# Patient Record
Sex: Female | Born: 1990 | Race: Black or African American | Hispanic: No | Marital: Single | Smoking: Never smoker
Health system: Southern US, Community
[De-identification: ages and names within clinical notes are randomized; demographics above are authoritative.]

## PROBLEM LIST (undated history)

## (undated) DIAGNOSIS — Z3490 Encounter for supervision of normal pregnancy, unspecified, unspecified trimester: Principal | ICD-10-CM

## (undated) DIAGNOSIS — N949 Unspecified condition associated with female genital organs and menstrual cycle: Secondary | ICD-10-CM

## (undated) DIAGNOSIS — B009 Herpesviral infection, unspecified: Secondary | ICD-10-CM

## (undated) DIAGNOSIS — R569 Unspecified convulsions: Secondary | ICD-10-CM

## (undated) HISTORY — DX: Encounter for supervision of normal pregnancy, unspecified, unspecified trimester: Z34.90

## (undated) HISTORY — DX: Unspecified condition associated with female genital organs and menstrual cycle: N94.9

## (undated) HISTORY — DX: Herpesviral infection, unspecified: B00.9

## (undated) HISTORY — PX: TONSILLECTOMY AND ADENOIDECTOMY: SHX28

## (undated) HISTORY — PX: DENTAL SURGERY: SHX609

## (undated) HISTORY — DX: Unspecified convulsions: R56.9

---
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
## 2011-06-12 LAB — RAPID STREP SCREEN (MED CTR MEBANE ONLY): Streptococcus, Group A Screen (Direct): NEGATIVE

## 2011-06-12 LAB — STREP A DNA PROBE: Group A Strep Probe: NEGATIVE

## 2011-07-20 ENCOUNTER — Emergency Department (HOSPITAL_COMMUNITY)
Admission: EM | Admit: 2011-07-20 | Discharge: 2011-07-20 | Disposition: A | Discharge status: Home / Self Care | Payer: BC Managed Care – PPO | Attending: Emergency Medicine | Admitting: Emergency Medicine

## 2011-07-20 DIAGNOSIS — J069 Acute upper respiratory infection, unspecified: Secondary | ICD-10-CM | POA: Insufficient documentation

## 2011-07-20 MED ORDER — PHENYLEPHRINE-CHLORPHEN-DM 12.5-4-15 MG/5ML PO SYRP: 5.0000 mL | ORAL_SOLUTION | ORAL | Status: DC

## 2011-07-20 NOTE — ED Notes (Signed)
Pt states has had sore throat x 5 days with worsening symptoms, to include cough and headache.  Denies fever/chills.

## 2011-07-20 NOTE — ED Provider Notes (Signed)
History     CSN: [id number] Arrival date & time: 07/20/2011  7:01 PM   First MD Initiated Contact with Patient 07/20/11 1912      Chief Complaint  Patient presents with  . Sore Throat  . Cough  . Weakness    (Consider location/radiation/quality/duration/timing/severity/associated sxs/prior treatment) HPI Comments: Pt states she was treated for an uri about 2 weeks ago. She got better, then started getting sick again about 5 days ago.  Patient is a 20 y.o. female presenting with pharyngitis, cough, and weakness. The history is provided by the patient.  Sore Throat This is a new problem. The current episode started in the past 7 days. The problem occurs daily. The problem has been gradually worsening. Associated symptoms include coughing, fatigue, headaches, a sore throat and weakness. Pertinent negatives include no abdominal pain, arthralgias, chest pain, nausea, neck pain or rash. The symptoms are aggravated by swallowing. She has tried nothing for the symptoms. The treatment provided no relief.  Cough Associated symptoms include headaches and sore throat. Pertinent negatives include no chest pain, no shortness of breath and no wheezing.  Weakness The primary symptoms include headaches. Primary symptoms do not include seizures, dizziness or nausea.  The headache is associated with weakness. The headache is not associated with photophobia.  Additional symptoms include weakness. Additional symptoms do not include photophobia or hallucinations.    History reviewed. No pertinent past medical history.  History reviewed. No pertinent past surgical history.  History reviewed. No pertinent family history.  History  Substance Use Topics  . Smoking status: Never Smoker   . Smokeless tobacco: Not on file  . Alcohol Use: No    OB History    Grav Para Term Preterm Abortions TAB SAB Ect Mult Living                  Review of Systems  Constitutional: Positive for fatigue. Negative  for activity change.       All ROS Neg except as noted in HPI  HENT: Positive for sore throat. Negative for nosebleeds and neck pain.   Eyes: Negative for photophobia and discharge.  Respiratory: Positive for cough. Negative for shortness of breath and wheezing.   Cardiovascular: Negative for chest pain and palpitations.  Gastrointestinal: Negative for nausea, abdominal pain and blood in stool.  Genitourinary: Negative for dysuria, frequency and hematuria.  Musculoskeletal: Negative for back pain and arthralgias.  Skin: Negative.  Negative for rash.  Neurological: Positive for weakness and headaches. Negative for dizziness, seizures and speech difficulty.  Psychiatric/Behavioral: Negative for hallucinations and confusion.    Allergies  Review of patient's allergies indicates no known allergies.  Home Medications  No current outpatient prescriptions on file.  BP 104/63  Pulse 74  Temp(Src) 98.3 F (36.8 C) (Oral)  Resp 16  Ht 5\' 4"  (1.626 m)  Wt 124 lb (56.246 kg)  BMI 21.28 kg/m2  SpO2 100%  LMP 06/29/2011  Physical Exam  Nursing note and vitals reviewed. Constitutional: She is oriented to person, place, and time. She appears well-developed and well-nourished.  Non-toxic appearance.  HENT:  Head: Normocephalic.  Right Ear: Tympanic membrane and external ear normal.  Left Ear: Tympanic membrane and external ear normal.       Mild increase redness of the posterior pharynx. Uvula mildly enlarged. Mild to mod nasal congestion.  Eyes: EOM and lids are normal. Pupils are equal, round, and reactive to light.  Neck: Normal range of motion. Neck supple. Carotid bruit is not  present.  Cardiovascular: Normal rate, regular rhythm, normal heart sounds, intact distal pulses and normal pulses.   Pulmonary/Chest: Breath sounds normal. No respiratory distress. She has no wheezes. She has no rales.  Abdominal: Soft. Bowel sounds are normal. There is no tenderness. There is no guarding.    Musculoskeletal: Normal range of motion.  Lymphadenopathy:       Head (right side): No submandibular adenopathy present.       Head (left side): No submandibular adenopathy present.    She has no cervical adenopathy.  Neurological: She is alert and oriented to person, place, and time. She has normal strength. No cranial nerve deficit or sensory deficit.  Skin: Skin is warm and dry.  Psychiatric: She has a normal mood and affect. Her speech is normal.    ED Course  Procedures (including critical care time)  Labs Reviewed - No data to display No results found. Pulse ox 100% on RA. - wnl.  Dx: URI   MDM  I have reviewed nursing notes, vital signs, and all appropriate lab and imaging results for this patient.        Kathie Dike, [place] 07/20/11 [phone]

## 2011-07-20 NOTE — ED Notes (Signed)
Pt presents with sore throat, cough, and weakness. Pt denies fever and n/v/d. No active coughing noted in triage. NAD at this time.

## 2011-07-21 NOTE — ED Provider Notes (Signed)
Medical screening examination/treatment/procedure(s) were performed by non-physician practitioner and as supervising physician I was immediately available for consultation/collaboration.  Flint Melter, MD 07/21/11 0000

## 2012-06-14 ENCOUNTER — Emergency Department (HOSPITAL_COMMUNITY)
Admission: EM | Admit: 2012-06-14 | Discharge: 2012-06-14 | Disposition: A | Discharge status: Home / Self Care | Payer: BC Managed Care – PPO | Source: Home / Self Care | Attending: Emergency Medicine | Admitting: Emergency Medicine

## 2012-06-14 ENCOUNTER — Encounter (HOSPITAL_COMMUNITY): Payer: Self-pay | Admitting: Emergency Medicine

## 2012-06-14 DIAGNOSIS — L989 Disorder of the skin and subcutaneous tissue, unspecified: Secondary | ICD-10-CM | POA: Insufficient documentation

## 2012-06-14 DIAGNOSIS — L0291 Cutaneous abscess, unspecified: Secondary | ICD-10-CM

## 2012-06-14 DIAGNOSIS — L259 Unspecified contact dermatitis, unspecified cause: Secondary | ICD-10-CM | POA: Insufficient documentation

## 2012-06-14 DIAGNOSIS — L309 Dermatitis, unspecified: Secondary | ICD-10-CM

## 2012-06-14 MED ORDER — CLINDAMYCIN HCL 150 MG PO CAPS
300.0000 mg | ORAL_CAPSULE | Freq: Once | ORAL | Status: DC
  Administered 2012-06-14: 300 mg via ORAL
  Filled 2012-06-14: qty 2

## 2012-06-14 MED ORDER — CLINDAMYCIN HCL 150 MG PO CAPS: 300.0000 mg | ORAL_CAPSULE | Freq: Three times a day (TID) | ORAL | Status: DC

## 2012-06-14 MED ORDER — CLOBETASOL PROPIONATE 0.05 % EX CREA: TOPICAL_CREAM | Freq: Two times a day (BID) | CUTANEOUS | Status: DC

## 2012-06-14 NOTE — ED Notes (Signed)
Pharmacist called to verify clindamycin prescription; prescription states to dispense 28 which is 4.5 day supply; after talking with dr Jeraldine Loots, states he wanted 7 day supply which would equal 42 pills; pharmacy informed.

## 2012-06-14 NOTE — ED Provider Notes (Signed)
History   This chart was scribed for Amanda Munch, MD by Melba Coon. The patient was seen in room TR05C/TR05C and the patient's care was started at 11:36AM.    CSN: [id number]  Arrival date & time 06/14/12  0950   First MD Initiated Contact with Patient 06/14/12 1106      No chief complaint on file.   (Consider location/radiation/quality/duration/timing/severity/associated sxs/prior treatment) The history is provided by the patient. No language interpreter was used.   Amanda Perez is a 21 y.o. female who presents to the Emergency Department complaining of constant, moderate to severe left facial swelling and pain that started as a bump on her left lower jaw about a week ago. The bump has been progressively getting larger since onset. She squeezed the bump and d/c started to drain from the area. Tylenol did not alleviate the pain.  Ms Tomasetti also c/o an eczema flare up since August. She has a Hx of eczema for years and usually cortisone cream alleviates the pain which is did not this episode. She has a rash to her elbows, face, back, neck, and on top of her head in her hair that has worsened over the past week and is pruritic.  No confusion, fever, neck pain, sore throat,  back pain, CP, SOB, abd pain, n/v/d, dysuria, or extremity pain, edema, weakness, numbness, or tingling. No known allergies. No other pertinent medical symptoms.  PCP Dr. Margo Aye here in [place] but she is going back to [place]  History reviewed. No pertinent past medical history.  No past surgical history on file.  No family history on file.  History  Substance Use Topics  . Smoking status: Never Smoker   . Smokeless tobacco: Not on file  . Alcohol Use: Yes    OB History    Grav Para Term Preterm Abortions TAB SAB Ect Mult Living                  Review of Systems 10 Systems reviewed and all are negative for acute change except as noted in the HPI.   Allergies  Review of patient's allergies  indicates no known allergies.  Home Medications  No current outpatient prescriptions on file.  BP 113/82  Pulse 92  Temp 99.2 F (37.3 C) (Oral)  Resp 16  SpO2 98%  Physical Exam  Nursing note and vitals reviewed. Constitutional: She is oriented to person, place, and time. She appears well-developed and well-nourished. No distress.  HENT:  Head: Normocephalic and atraumatic.  Eyes: EOM are normal.  Neck: Neck supple. No tracheal deviation present.  Cardiovascular: Normal rate.   Pulmonary/Chest: Effort normal. No respiratory distress.  Musculoskeletal: Normal range of motion.  Lymphadenopathy:    She has no cervical adenopathy.  Neurological: She is alert and oriented to person, place, and time.  Skin: Skin is warm and dry.       Multiple lesions on her back, bilateral arms and scalp; 5 cm indurated area on left mandible with no fluctuance   Psychiatric: She has a normal mood and affect. Her behavior is normal.    ED Course  Procedures (including critical care time)  COORDINATION OF CARE:  11:43AM - clindamycin will be given and rx for Ms Normoyle and is advised to apply warm compresses at home. She is advised to f/u with Urgent Care in [place] and is ready for d/c.   Labs Reviewed - No data to display No results found.   No diagnosis found.    MDM  I personally performed the services described in this documentation, which was scribed in my presence. The recorded information has been reviewed and considered.  This young female with history of eczema now presents with concerns of this entity as well as a new left facial lesion.  On exam she is in no distress, is afebrile, with no notable vital sign abnormalities.  The patient's facial wound is consistent with an abscess, though with no area of fluctuance, no spreading erythema, no suggestion of cellulitis.  The patient was advised on natural course of abscesses, started on antibiotics, discharged in stable condition.   We discussed the need for primary care followup.    Amanda Munch, MD 06/14/12 1626

## 2012-06-14 NOTE — ED Notes (Signed)
States her excema has flared up and has a bump on her left lower jaw that is red and swollen  States squeezed a bump

## 2012-09-11 NOTE — L&D Delivery Note (Signed)
Delivery Note At 6:26 PM a viable female was delivered via  (Presentation: vertex ).  APGAR: pending weight : pending Placenta status- delivered intact the following complications: .   Anesthesia: Epidural  Episiotomy: None Lacerations: 1st degree;Perineal Suture Repair: 3.0 vicryl Est. Blood Loss (mL): 500cc  Mom to postpartum.  Baby to nursery-stable.  HARRAWAY-SMITH, Girtrude Enslin 05/13/2013, 6:35 PM

## 2012-10-16 LAB — OB RESULTS CONSOLE GC/CHLAMYDIA
Chlamydia: NEGATIVE
Gonorrhea: NEGATIVE

## 2012-11-13 LAB — SICKLE CELL SCREEN: Sickle Cell Screen: NEGATIVE

## 2012-11-13 LAB — OB RESULTS CONSOLE HIV ANTIBODY (ROUTINE TESTING)
HIV: NONREACTIVE
HIV: NONREACTIVE

## 2012-11-13 LAB — OB RESULTS CONSOLE RUBELLA ANTIBODY, IGM: Rubella: IMMUNE

## 2012-11-13 LAB — OB RESULTS CONSOLE PLATELET COUNT: Platelets: 171 10*3/uL

## 2013-02-05 ENCOUNTER — Encounter: Payer: Self-pay | Admitting: *Deleted

## 2013-02-11 ENCOUNTER — Encounter: Payer: Self-pay | Admitting: Women's Health

## 2013-02-11 ENCOUNTER — Ambulatory Visit (INDEPENDENT_AMBULATORY_CARE_PROVIDER_SITE_OTHER): Payer: BC Managed Care – PPO | Admitting: Women's Health

## 2013-02-11 VITALS — BP 100/68 | Wt 166.0 lb

## 2013-02-11 DIAGNOSIS — Z3403 Encounter for supervision of normal first pregnancy, third trimester: Secondary | ICD-10-CM

## 2013-02-11 DIAGNOSIS — Z1389 Encounter for screening for other disorder: Secondary | ICD-10-CM

## 2013-02-11 DIAGNOSIS — O0930 Supervision of pregnancy with insufficient antenatal care, unspecified trimester: Secondary | ICD-10-CM

## 2013-02-11 DIAGNOSIS — Z331 Pregnant state, incidental: Secondary | ICD-10-CM

## 2013-02-11 DIAGNOSIS — Z3400 Encounter for supervision of normal first pregnancy, unspecified trimester: Secondary | ICD-10-CM | POA: Insufficient documentation

## 2013-02-11 NOTE — Patient Instructions (Addendum)
You will have your sugar test next visit.  Please do not eat or drink anything after midnight the night before you come, not even water.  You will be here for at least two hours.    Pregnancy - Third Trimester The third trimester of pregnancy (the last 3 months) is a period of the most rapid growth for you and your baby. The baby approaches a length of 20 inches and a weight of 6 to 10 pounds. The baby is adding on fat and getting ready for life outside your body. While inside, babies have periods of sleeping and waking, sucking thumbs, and hiccuping. You can often feel small contractions of the uterus. This is false labor. It is also called Braxton-Hicks contractions. This is like a practice for labor. The usual problems in this stage of pregnancy include more difficulty breathing, swelling of the hands and feet from water retention, and having to urinate more often because of the uterus and baby pressing on your bladder.  PRENATAL EXAMS  Blood work may continue to be done during prenatal exams. These tests are done to check on your health and the probable health of your baby. Blood work is used to follow your blood levels (hemoglobin). Anemia (low hemoglobin) is common during pregnancy. Iron and vitamins are given to help prevent this. You may also continue to be checked for diabetes. Some of the past blood tests may be done again.  The size of the uterus is measured during each visit. This makes sure your baby is growing properly according to your pregnancy dates.  Your blood pressure is checked every prenatal visit. This is to make sure you are not getting toxemia.  Your urine is checked every prenatal visit for infection, diabetes, and protein.  Your weight is checked at each visit. This is done to make sure gains are happening at the suggested rate and that you and your baby are growing normally.  Sometimes, an ultrasound is performed to confirm the position and the proper growth and  development of the baby. This is a test done that bounces harmless sound waves off the baby so your caregiver can more accurately determine a due date.  Discuss the type of pain medicine and anesthesia you will have during your labor and delivery.  Discuss the possibility and anesthesia if a cesarean section might be necessary.  Inform your caregiver if there is any mental or physical violence at home. Sometimes, a specialized non-stress test, contraction stress test, and biophysical profile are done to make sure the baby is not having a problem. Checking the amniotic fluid surrounding the baby is called an amniocentesis. The amniotic fluid is removed by sticking a needle into the belly (abdomen). This is sometimes done near the end of pregnancy if an early delivery is required. In this case, it is done to help make sure the baby's lungs are mature enough for the baby to live outside of the womb. If the lungs are not mature and it is unsafe to deliver the baby, an injection of cortisone medicine is given to the mother 1 to 2 days before the delivery. This helps the baby's lungs mature and makes it safer to deliver the baby. CHANGES OCCURING IN THE THIRD TRIMESTER OF PREGNANCY Your body goes through many changes during pregnancy. They vary from person to person. Talk to your caregiver about changes you notice and are concerned about.  During the last trimester, you have probably had an increase in your appetite. It   is normal to have cravings for certain foods. This varies from person to person and pregnancy to pregnancy.  You may begin to get stretch marks on your hips, abdomen, and breasts. These are normal changes in the body during pregnancy. There are no exercises or medicines to take which prevent this change.  Constipation may be treated with a stool softener or adding bulk to your diet. Drinking lots of fluids, fiber in vegetables, fruits, and whole grains are helpful.  Exercising is also  helpful. If you have been very active up until your pregnancy, most of these activities can be continued during your pregnancy. If you have been less active, it is helpful to start an exercise program such as walking. Consult your caregiver before starting exercise programs.  Avoid all smoking, alcohol, non-prescribed drugs, herbs and "street drugs" during your pregnancy. These chemicals affect the formation and growth of the baby. Avoid chemicals throughout the pregnancy to ensure the delivery of a healthy infant.  Backache, varicose veins, and hemorrhoids may develop or get worse.  You will tire more easily in the third trimester, which is normal.  The baby's movements may be stronger and more often.  You may become short of breath easily.  Your belly button may stick out.  A yellow discharge may leak from your breasts called colostrum.  You may have a bloody mucus discharge. This usually occurs a few days to a week before labor begins. HOME CARE INSTRUCTIONS   Keep your caregiver's appointments. Follow your caregiver's instructions regarding medicine use, exercise, and diet.  During pregnancy, you are providing food for you and your baby. Continue to eat regular, well-balanced meals. Choose foods such as meat, fish, milk and other low fat dairy products, vegetables, fruits, and whole-grain breads and cereals. Your caregiver will tell you of the ideal weight gain.  A physical sexual relationship may be continued throughout pregnancy if there are no other problems such as early (premature) leaking of amniotic fluid from the membranes, vaginal bleeding, or belly (abdominal) pain.  Exercise regularly if there are no restrictions. Check with your caregiver if you are unsure of the safety of your exercises. Greater weight gain will occur in the last 2 trimesters of pregnancy. Exercising helps:  Control your weight.  Get you in shape for labor and delivery.  You lose weight after you  deliver.  Rest a lot with legs elevated, or as needed for leg cramps or low back pain.  Wear a good support or jogging bra for breast tenderness during pregnancy. This may help if worn during sleep. Pads or tissues may be used in the bra if you are leaking colostrum.  Do not use hot tubs, steam rooms, or saunas.  Wear your seat belt when driving. This protects you and your baby if you are in an accident.  Avoid raw meat, cat litter boxes and soil used by cats. These carry germs that can cause birth defects in the baby.  It is easier to leak urine during pregnancy. Tightening up and strengthening the pelvic muscles will help with this problem. You can practice stopping your urination while you are going to the bathroom. These are the same muscles you need to strengthen. It is also the muscles you would use if you were trying to stop from passing gas. You can practice tightening these muscles up 10 times a set and repeating this about 3 times per day. Once you know what muscles to tighten up, do not perform these exercises   during urination. It is more likely to cause an infection by backing up the urine.  Ask for help if you have financial, counseling, or nutritional needs during pregnancy. Your caregiver will be able to offer counseling for these needs as well as refer you for other special needs.  Make a list of emergency phone numbers and have them available.  Plan on getting help from family or friends when you go home from the hospital.  Make a trial run to the hospital.  Take prenatal classes with the father to understand, practice, and ask questions about the labor and delivery.  Prepare the baby's room or nursery.  Do not travel out of the city unless it is absolutely necessary and with the advice of your caregiver.  Wear only low or no heal shoes to have better balance and prevent falling. MEDICINES AND DRUG USE IN PREGNANCY  Take prenatal vitamins as directed. The vitamin  should contain 1 milligram of folic acid. Keep all vitamins out of reach of children. Only a couple vitamins or tablets containing iron may be fatal to a baby or young child when ingested.  Avoid use of all medicines, including herbs, over-the-counter medicines, not prescribed or suggested by your caregiver. Only take over-the-counter or prescription medicines for pain, discomfort, or fever as directed by your caregiver. Do not use aspirin, ibuprofen or naproxen unless approved by your caregiver.  Let your caregiver also know about herbs you may be using.  Alcohol is related to a number of birth defects. This includes fetal alcohol syndrome. All alcohol, in any form, should be avoided completely. Smoking will cause low birth rate and premature babies.  Illegal drugs are very harmful to the baby. They are absolutely forbidden. A baby born to an addicted mother will be addicted at birth. The baby will go through the same withdrawal an adult does. SEEK MEDICAL CARE IF: You have any concerns or worries during your pregnancy. It is better to call with your questions if you feel they cannot wait, rather than worry about them. SEEK IMMEDIATE MEDICAL CARE IF:   An unexplained oral temperature above 102 F (38.9 C) develops, or as your caregiver suggests.  You have leaking of fluid from the vagina. If leaking membranes are suspected, take your temperature and tell your caregiver of this when you call.  There is vaginal spotting, bleeding or passing clots. Tell your caregiver of the amount and how many pads are used.  You develop a bad smelling vaginal discharge with a change in the color from clear to white.  You develop vomiting that lasts more than 24 hours.  You develop chills or fever.  You develop shortness of breath.  You develop burning on urination.  You loose more than 2 pounds of weight or gain more than 2 pounds of weight or as suggested by your caregiver.  You notice sudden  swelling of your face, hands, and feet or legs.  You develop belly (abdominal) pain. Round ligament discomfort is a common non-cancerous (benign) cause of abdominal pain in pregnancy. Your caregiver still must evaluate you.  You develop a severe headache that does not go away.  You develop visual problems, blurred or double vision.  If you have not felt your baby move for more than 1 hour. If you think the baby is not moving as much as usual, eat something with sugar in it and lie down on your left side for an hour. The baby should move at least 4 to   5 times per hour. Call right away if your baby moves less than that.  You fall, are in a car accident, or any kind of trauma.  There is mental or physical violence at home. Document Released: 08/22/2001 Document Revised: 05/22/2012 Document Reviewed: 02/24/2009 [employer] Patient Information 2014 [employer].  

## 2013-02-11 NOTE — Progress Notes (Signed)
  Subjective:    Amanda Perez is a 22 y.o. G1P0 African American female at [redacted]w[redacted]d by 13.1wk u/s, being seen today for her first obstetrical visit.  She recently moved back to [place] from [place] in April, where she started her prenatal care at 13wks.  She had AFP drawn, but results are not included in faxed records.  She had an anatomy u/s at 18wks w/ limited views and was to f/u 2-4wks later, but was in process of moving. Her obstetrical history is significant for primigravida.  Pregnancy history fully reviewed.   Patient reports no complaints.  Filed Vitals:   02/11/13 1552  BP: 100/68  Weight: 166 lb (75.297 kg)    HISTORY: OB History   Grav Para Term Preterm Abortions TAB SAB Ect Mult Living   1              # Outc Date [place] Lbr Len/2nd Wgt Sex Del Anes PTL Lv   1 CUR              Past Medical History  Diagnosis Date  . Medical history non-contributory    Past Surgical History  Procedure Laterality Date  . Dental surgery  2009   Family History  Problem Relation Age of Onset  . Hypertension Mother   . Hypertension Maternal Grandmother   . Hypertension Paternal Grandmother      Exam                                   System:     Skin: normal coloration and turgor, no rashes    Neurologic: oriented, normal mood   Extremities: normal strength, tone, and muscle mass   HEENT PERRLA   Mouth/Teeth mucous membranes moist   Cardiovascular: regular rate and rhythm   Respiratory:  appears well, vitals normal, no respiratory distress, acyanotic, normal RR   Abdomen: soft, non-tender    FHR: 160 via doppler FH: 28  Assessment:    Pregnancy: G1P0 Patient Active Problem List   Diagnosis Date Noted  . Supervision of normal first pregnancy 02/11/2013    Priority: High      [redacted]w[redacted]d G1P0  New OB visit- transfer from [place]    Plan:    Continue prenatal vitamins Problem list reviewed and updated Reviewed ptl s/s and fetal kick counts Genetic Screening discussed Quad  Screen: already performed- request results from [place] Cystic fibrosis screening discussed requested- will get w/ PN2 Ultrasound discussed; fetal survey: already performed in [place], although limited views- needs f/u  Follow up asap for PN2/CF, f/u anatomy u/s   Marge Duncans 02/11/2013 4:23 PM

## 2013-02-11 NOTE — Progress Notes (Signed)
New OB packet given. Consents signed.  

## 2013-02-19 ENCOUNTER — Other Ambulatory Visit (HOSPITAL_COMMUNITY): Payer: Self-pay | Admitting: Nurse Practitioner

## 2013-02-19 DIAGNOSIS — E049 Nontoxic goiter, unspecified: Secondary | ICD-10-CM

## 2013-02-20 ENCOUNTER — Encounter: Payer: Self-pay | Admitting: Adult Health

## 2013-02-20 ENCOUNTER — Ambulatory Visit (INDEPENDENT_AMBULATORY_CARE_PROVIDER_SITE_OTHER): Payer: BC Managed Care – PPO

## 2013-02-20 ENCOUNTER — Other Ambulatory Visit: Payer: BC Managed Care – PPO

## 2013-02-20 ENCOUNTER — Ambulatory Visit: Payer: BC Managed Care – PPO

## 2013-02-20 ENCOUNTER — Other Ambulatory Visit: Payer: Self-pay | Admitting: Obstetrics & Gynecology

## 2013-02-20 ENCOUNTER — Ambulatory Visit (INDEPENDENT_AMBULATORY_CARE_PROVIDER_SITE_OTHER): Payer: BC Managed Care – PPO | Admitting: Adult Health

## 2013-02-20 VITALS — BP 108/66 | Wt 170.5 lb

## 2013-02-20 DIAGNOSIS — Z3400 Encounter for supervision of normal first pregnancy, unspecified trimester: Secondary | ICD-10-CM

## 2013-02-20 DIAGNOSIS — Z331 Pregnant state, incidental: Secondary | ICD-10-CM

## 2013-02-20 DIAGNOSIS — Z1389 Encounter for screening for other disorder: Secondary | ICD-10-CM

## 2013-02-20 DIAGNOSIS — Z3490 Encounter for supervision of normal pregnancy, unspecified, unspecified trimester: Secondary | ICD-10-CM

## 2013-02-20 DIAGNOSIS — O0930 Supervision of pregnancy with insufficient antenatal care, unspecified trimester: Secondary | ICD-10-CM

## 2013-02-20 DIAGNOSIS — Z362 Encounter for other antenatal screening follow-up: Secondary | ICD-10-CM

## 2013-02-20 LAB — POCT URINALYSIS DIPSTICK
Blood, UA: NEGATIVE
Ketones, UA: NEGATIVE
Leukocytes, UA: NEGATIVE

## 2013-02-20 LAB — CBC
Platelets: 142 10*3/uL — ABNORMAL LOW (ref 150–400)
RBC: 3.88 MIL/uL (ref 3.87–5.11)
WBC: 7.9 10*3/uL (ref 4.0–10.5)

## 2013-02-20 MED ORDER — PNV PRENATAL PLUS MULTIVITAMIN 27-1 MG PO TABS: 1.0000 | ORAL_TABLET | Freq: Every day | ORAL | Status: DC

## 2013-02-20 NOTE — Progress Notes (Signed)
U/S PERFORMED. LATE ENTRY  INCOMPLETE SCREEN, NEED FETAL FACIAL PROFILE, LIPS/NOSTRILSAND NB D/T FETAL PRONE POSITION.ALL OTHER ANAT APPEARS NML, NML FLUID, VERTEX POSITION, FEMALE ,ACTIVE, BILAT ADN VIEWED,HIGH ANT PLAC , CX CLOSED 3.7 CM, EFW3# 2OZ. MEAS. C/W DATES,EDC 05-05-2013

## 2013-02-20 NOTE — Progress Notes (Signed)
Needs refill on PNV, no complaints at this time.

## 2013-02-20 NOTE — Progress Notes (Signed)
Denies any bleeding, or cramps, has good fetal movement.Will schedule follow up US to check lips/nostrils and face.today it was a girl.Return in 3 weeks for [place] and see Dr Emelda Fear Had PN2 this am.

## 2013-02-20 NOTE — Addendum Note (Signed)
Addended by: Criss Alvine on: 02/20/2013 04:33 PM   Modules accepted: Orders

## 2013-02-20 NOTE — Patient Instructions (Addendum)

## 2013-02-21 LAB — ANTIBODY SCREEN: Antibody Screen: NEGATIVE

## 2013-02-21 LAB — RPR

## 2013-02-21 LAB — HSV 2 ANTIBODY, IGG: HSV 2 Glycoprotein G Ab, IgG: 10.01 IV — ABNORMAL HIGH

## 2013-02-24 ENCOUNTER — Ambulatory Visit (HOSPITAL_COMMUNITY)
Admission: RE | Admit: 2013-02-24 | Discharge: 2013-02-24 | Disposition: A | Discharge status: Home / Self Care | Payer: BC Managed Care – PPO | Source: Ambulatory Visit | Attending: Family Medicine | Admitting: Family Medicine

## 2013-02-24 DIAGNOSIS — E049 Nontoxic goiter, unspecified: Secondary | ICD-10-CM

## 2013-03-03 ENCOUNTER — Other Ambulatory Visit: Payer: Self-pay | Admitting: Obstetrics & Gynecology

## 2013-03-03 DIAGNOSIS — Z3490 Encounter for supervision of normal pregnancy, unspecified, unspecified trimester: Secondary | ICD-10-CM

## 2013-03-12 ENCOUNTER — Other Ambulatory Visit: Payer: Self-pay | Admitting: Obstetrics & Gynecology

## 2013-03-12 DIAGNOSIS — O358XX0 Maternal care for other (suspected) fetal abnormality and damage, not applicable or unspecified: Secondary | ICD-10-CM

## 2013-03-13 ENCOUNTER — Encounter: Payer: BC Managed Care – PPO | Admitting: Obstetrics & Gynecology

## 2013-03-13 ENCOUNTER — Other Ambulatory Visit: Payer: BC Managed Care – PPO

## 2013-03-13 ENCOUNTER — Ambulatory Visit (INDEPENDENT_AMBULATORY_CARE_PROVIDER_SITE_OTHER): Payer: Medicaid Other | Admitting: Obstetrics & Gynecology

## 2013-03-13 ENCOUNTER — Encounter: Payer: Self-pay | Admitting: Obstetrics & Gynecology

## 2013-03-13 ENCOUNTER — Other Ambulatory Visit (INDEPENDENT_AMBULATORY_CARE_PROVIDER_SITE_OTHER): Payer: BC Managed Care – PPO

## 2013-03-13 ENCOUNTER — Other Ambulatory Visit: Payer: Self-pay | Admitting: Obstetrics & Gynecology

## 2013-03-13 VITALS — BP 90/60 | Ht 63.0 in | Wt 170.0 lb

## 2013-03-13 DIAGNOSIS — O0933 Supervision of pregnancy with insufficient antenatal care, third trimester: Secondary | ICD-10-CM

## 2013-03-13 DIAGNOSIS — O358XX0 Maternal care for other (suspected) fetal abnormality and damage, not applicable or unspecified: Secondary | ICD-10-CM

## 2013-03-13 DIAGNOSIS — Z331 Pregnant state, incidental: Secondary | ICD-10-CM

## 2013-03-13 DIAGNOSIS — Z1389 Encounter for screening for other disorder: Secondary | ICD-10-CM

## 2013-03-13 DIAGNOSIS — Z3400 Encounter for supervision of normal first pregnancy, unspecified trimester: Secondary | ICD-10-CM | POA: Insufficient documentation

## 2013-03-13 DIAGNOSIS — O0930 Supervision of pregnancy with insufficient antenatal care, unspecified trimester: Secondary | ICD-10-CM

## 2013-03-13 DIAGNOSIS — Z3403 Encounter for supervision of normal first pregnancy, third trimester: Secondary | ICD-10-CM

## 2013-03-13 LAB — POCT URINALYSIS DIPSTICK
Blood, UA: NEGATIVE
Glucose, UA: NEGATIVE
Nitrite, UA: NEGATIVE

## 2013-03-13 MED ORDER — ACYCLOVIR 400 MG PO TABS: 400.0000 mg | ORAL_TABLET | Freq: Three times a day (TID) | ORAL | Status: DC

## 2013-03-13 NOTE — Patient Instructions (Signed)
Breastfeeding A change in hormones during your pregnancy causes growth of your breast tissue and an increase in number and size of milk ducts. The hormone prolactin allows proteins, sugars, and fats from your blood supply to make breast milk in your milk-producing glands. The hormone progesterone prevents breast milk from being released before the birth of your baby. After the birth of your baby, your progesterone level decreases allowing breast milk to be released. Thoughts of your baby, as well as his or her sucking or crying, can stimulate the release of milk from the milk-producing glands. Deciding to breastfeed (nurse) is one of the best choices you can make for you and your baby. The information that follows gives a brief review of the benefits, as well as other important skills to know about breastfeeding. BENEFITS OF BREASTFEEDING For your baby  The first milk (colostrum) helps your baby's digestive system function better.   There are antibodies in your milk that help your baby fight off infections.   Your baby has a lower incidence of asthma, allergies, and sudden infant death syndrome (SIDS).   The nutrients in breast milk are better for your baby than infant formulas.  Breast milk improves your baby's brain development.   Your baby will have less gas, colic, and constipation.  Your baby is less likely to develop other conditions, such as childhood obesity, asthma, or diabetes mellitus. For you  Breastfeeding helps develop a very special bond between you and your baby.   Breastfeeding is convenient, always available at the correct temperature, and costs nothing.   Breastfeeding helps to burn calories and helps you lose the weight gained during pregnancy.   Breastfeeding makes your uterus contract back down to normal size faster and slows bleeding following delivery.   Breastfeeding mothers have a lower risk of developing osteoporosis or breast or ovarian cancer later  in life.  BREASTFEEDING FREQUENCY  A healthy, full-term baby may breastfeed as often as every hour or space his or her feedings to every 3 hours. Breastfeeding frequency will vary from baby to baby.   Newborns should be fed no less than every 2 3 hours during the day and every 4 5 hours during the night. You should breastfeed a minimum of 8 feedings in a 24 hour period.  Awaken your baby to breastfeed if it has been 3 4 hours since the last feeding.  Breastfeed when you feel the need to reduce the fullness of your breasts or when your newborn shows signs of hunger. Signs that your baby may be hungry include:  Increased alertness or activity.  Stretching.  Movement of the head from side to side.  Movement of the head and opening of the mouth when the corner of the mouth or cheek is stroked (rooting).  Increased sucking sounds, smacking lips, cooing, sighing, or squeaking.  Hand-to-mouth movements.  Increased sucking of fingers or hands.  Fussing.  Intermittent crying.  Signs of extreme hunger will require calming and consoling before you try to feed your baby. Signs of extreme hunger may include:  Restlessness.  A loud, strong cry.  Screaming.  Frequent feeding will help you make more milk and will help prevent problems, such as sore nipples and engorgement of the breasts.  BREASTFEEDING   Whether lying down or sitting, be sure that the baby's abdomen is facing your abdomen.   Support your breast with 4 fingers under your breast and your thumb above your nipple. Make sure your fingers are well away from   your nipple and your baby's mouth.   Stroke your baby's lips gently with your finger or nipple.   When your baby's mouth is open wide enough, place all of your nipple and as much of the colored area around your nipple (areola) as possible into your baby's mouth.  More areola should be visible above his or her upper lip than below his or her lower lip.  Your  baby's tongue should be between his or her lower gum and your breast.  Ensure that your baby's mouth is correctly positioned around the nipple (latched). Your baby's lips should create a seal on your breast.  Signs that your baby has effectively latched onto your nipple include:  Tugging or sucking without pain.  Swallowing heard between sucks.  Absent click or smacking sound.  Muscle movement above and in front of his or her ears with sucking.  Your baby must suck about 2 3 minutes in order to get your milk. Allow your baby to feed on each breast as long as he or she wants. Nurse your baby until he or she unlatches or falls asleep at the first breast, then offer the second breast.  Signs that your baby is full and satisfied include:  A gradual decrease in the number of sucks or complete cessation of sucking.  Falling asleep.  Extension or relaxation of his or her body.  Retention of a small amount of milk in his or her mouth.  Letting go of your breast by himself or herself.  Signs of effective breastfeeding in you include:  Breasts that have increased firmness, weight, and size prior to feeding.  Breasts that are softer after nursing.  Increased milk volume, as well as a change in milk consistency and color by the 5th day of breastfeeding.  Breast fullness relieved by breastfeeding.  Nipples are not sore, cracked, or bleeding.  If needed, break the suction by putting your finger into the corner of your baby's mouth and sliding your finger between his or her gums. Then, remove your breast from his or her mouth.  It is common for babies to spit up a small amount after a feeding.  Babies often swallow air during feeding. This can make babies fussy. Burping your baby between breasts can help with this.  Vitamin D supplements are recommended for babies who get only breast milk.  Avoid using a pacifier during your baby's first 4 6 weeks.  Avoid supplemental feedings of  water, formula, or juice in place of breastfeeding. Breast milk is all the food your baby needs. It is not necessary for your baby to have water or formula. Your breasts will make more milk if supplemental feedings are avoided during the early weeks. HOW TO TELL WHETHER YOUR BABY IS GETTING ENOUGH BREAST MILK Wondering whether or not your baby is getting enough milk is a common concern among mothers. You can be assured that your baby is getting enough milk if:   Your baby is actively sucking and you hear swallowing.   Your baby seems relaxed and satisfied after a feeding.   Your baby nurses at least 8 12 times in a 24 hour time period.  During the first 3 5 days of age:  Your baby is wetting at least 3 5 diapers in a 24 hour period. The urine should be clear and pale yellow.  Your baby is having at least 3 4 stools in a 24 hour period. The stool should be soft and yellow.  At   5 7 days of age, your baby is having at least 3 6 stools in a 24 hour period. The stool should be seedy and yellow by 5 days of age.  Your baby has a weight loss less than 7 10% during the first 3 days of age.  Your baby does not lose weight after 3 7 days of age.  Your baby gains 4 7 ounces each week after he or she is 4 days of age.  Your baby gains weight by 5 days of age and is back to birth weight within 2 weeks. ENGORGEMENT In the first week after your baby is born, you may experience extremely full breasts (engorgement). When engorged, your breasts may feel heavy, warm, or tender to the touch. Engorgement peaks within 24 48 hours after delivery of your baby.  Engorgement may be reduced by:  Continuing to breastfeed.  Increasing the frequency of breastfeeding.  Taking warm showers or applying warm, moist heat to your breasts just before each feeding. This increases circulation and helps the milk flow.   Gently massaging your breast before and during the feedings. With your fingertips, massage from  your chest wall towards your nipple in a circular motion.   Ensuring that your baby empties at least one breast at every feeding. It also helps to start the next feeding on the opposite breast.   Expressing breast milk by hand or by using a breast pump to empty the breasts if your baby is sleepy, or not nursing well. You may also want to express milk if you are returning to work oryou feel you are getting engorged.  Ensuring your baby is latched on and positioned properly while breastfeeding. If you follow these suggestions, your engorgement should improve in 24 48 hours. If you are still experiencing difficulty, call your lactation consultant or caregiver.  CARING FOR YOURSELF Take care of your breasts.  Bathe or shower daily.   Avoid using soap on your nipples.   Wear a supportive bra. Avoid wearing underwire style bras.  Air dry your nipples for a 3 4minutes after each feeding.   Use only cotton bra pads to absorb breast milk leakage. Leaking of breast milk between feedings is normal.   Use only pure lanolin on your nipples after nursing. You do not need to wash it off before feeding your baby again. Another option is to express a few drops of breast milk and gently massage that milk into your nipples.  Continue breast self-awareness checks. Take care of yourself.  Eat healthy foods. Alternate 3 meals with 3 snacks.  Avoid foods that you notice affect your baby in a bad way.  Drink milk, fruit juice, and water to satisfy your thirst (about 8 glasses a day).   Rest often, relax, and take your prenatal vitamins to prevent fatigue, stress, and anemia.  Avoid chewing and smoking tobacco.  Avoid alcohol and drug use.  Take over-the-counter and prescribed medicine only as directed by your caregiver or pharmacist. You should always check with your caregiver or pharmacist before taking any new medicine, vitamin, or herbal supplement.  Know that pregnancy is possible while  breastfeeding. If desired, talk to your caregiver about family planning and safe birth control methods that may be used while breastfeeding. SEEK MEDICAL CARE IF:   You feel like you want to stop breastfeeding or have become frustrated with breastfeeding.  You have painful breasts or nipples.  Your nipples are cracked or bleeding.  Your breasts are red, tender,   or warm.  You have a swollen area on either breast.  You have a fever or chills.  You have nausea or vomiting.  You have drainage from your nipples.  Your breasts do not become full before feedings by the 5th day after delivery.  You feel sad and depressed.  Your baby is too sleepy to eat well.  Your baby is having trouble sleeping.   Your baby is wetting less than 3 diapers in a 24 hour period.  Your baby has less than 3 stools in a 24 hour period.  Your baby's skin or the white part of his or her eyes becomes more yellow.   Your baby is not gaining weight by 5 days of age. MAKE SURE YOU:   Understand these instructions.  Will watch your condition.  Will get help right away if you are not doing well or get worse. Document Released: 08/28/2005 Document Revised: 05/22/2012 Document Reviewed: 04/03/2012 [employer] Patient Information 2014 [employer].  

## 2013-03-13 NOTE — Progress Notes (Signed)
Pt here today for routine visit and [place]. Pt states she has good fetal movement and denies any problems at this time.

## 2013-03-13 NOTE — Progress Notes (Signed)
U/S(32+3wks)-vtx active fetus, approp growth EFW 4 lb 3 oz, fluid wnl AFI =11.5cm, ant gr 1 plac, NB, profile, and nose/lips noted today

## 2013-03-13 NOTE — Progress Notes (Signed)
BP weight and urine results all reviewed and noted. Patient reports good fetal movement, denies any bleeding and no rupture of membranes symptoms or regular contractions. Patient is without complaints. All questions were answered.  

## 2013-03-27 ENCOUNTER — Encounter: Payer: Self-pay | Admitting: Obstetrics & Gynecology

## 2013-03-27 ENCOUNTER — Ambulatory Visit (INDEPENDENT_AMBULATORY_CARE_PROVIDER_SITE_OTHER): Payer: Medicaid Other | Admitting: Obstetrics & Gynecology

## 2013-03-27 VITALS — BP 110/68 | Wt 174.0 lb

## 2013-03-27 DIAGNOSIS — O98519 Other viral diseases complicating pregnancy, unspecified trimester: Secondary | ICD-10-CM

## 2013-03-27 DIAGNOSIS — Z1389 Encounter for screening for other disorder: Secondary | ICD-10-CM

## 2013-03-27 DIAGNOSIS — Z3403 Encounter for supervision of normal first pregnancy, third trimester: Secondary | ICD-10-CM

## 2013-03-27 DIAGNOSIS — Z331 Pregnant state, incidental: Secondary | ICD-10-CM

## 2013-03-27 LAB — POCT URINALYSIS DIPSTICK
Glucose, UA: NEGATIVE
Ketones, UA: NEGATIVE
Protein, UA: NEGATIVE

## 2013-03-27 NOTE — Patient Instructions (Signed)
Breastfeeding A change in hormones during your pregnancy causes growth of your breast tissue and an increase in number and size of milk ducts. The hormone prolactin allows proteins, sugars, and fats from your blood supply to make breast milk in your milk-producing glands. The hormone progesterone prevents breast milk from being released before the birth of your baby. After the birth of your baby, your progesterone level decreases allowing breast milk to be released. Thoughts of your baby, as well as his or her sucking or crying, can stimulate the release of milk from the milk-producing glands. Deciding to breastfeed (nurse) is one of the best choices you can make for you and your baby. The information that follows gives a brief review of the benefits, as well as other important skills to know about breastfeeding. BENEFITS OF BREASTFEEDING For your baby  The first milk (colostrum) helps your baby's digestive system function better.   There are antibodies in your milk that help your baby fight off infections.   Your baby has a lower incidence of asthma, allergies, and sudden infant death syndrome (SIDS).   The nutrients in breast milk are better for your baby than infant formulas.  Breast milk improves your baby's brain development.   Your baby will have less gas, colic, and constipation.  Your baby is less likely to develop other conditions, such as childhood obesity, asthma, or diabetes mellitus. For you  Breastfeeding helps develop a very special bond between you and your baby.   Breastfeeding is convenient, always available at the correct temperature, and costs nothing.   Breastfeeding helps to burn calories and helps you lose the weight gained during pregnancy.   Breastfeeding makes your uterus contract back down to normal size faster and slows bleeding following delivery.   Breastfeeding mothers have a lower risk of developing osteoporosis or breast or ovarian cancer later  in life.  BREASTFEEDING FREQUENCY  A healthy, full-term baby may breastfeed as often as every hour or space his or her feedings to every 3 hours. Breastfeeding frequency will vary from baby to baby.   Newborns should be fed no less than every 2 3 hours during the day and every 4 5 hours during the night. You should breastfeed a minimum of 8 feedings in a 24 hour period.  Awaken your baby to breastfeed if it has been 3 4 hours since the last feeding.  Breastfeed when you feel the need to reduce the fullness of your breasts or when your newborn shows signs of hunger. Signs that your baby may be hungry include:  Increased alertness or activity.  Stretching.  Movement of the head from side to side.  Movement of the head and opening of the mouth when the corner of the mouth or cheek is stroked (rooting).  Increased sucking sounds, smacking lips, cooing, sighing, or squeaking.  Hand-to-mouth movements.  Increased sucking of fingers or hands.  Fussing.  Intermittent crying.  Signs of extreme hunger will require calming and consoling before you try to feed your baby. Signs of extreme hunger may include:  Restlessness.  A loud, strong cry.  Screaming.  Frequent feeding will help you make more milk and will help prevent problems, such as sore nipples and engorgement of the breasts.  BREASTFEEDING   Whether lying down or sitting, be sure that the baby's abdomen is facing your abdomen.   Support your breast with 4 fingers under your breast and your thumb above your nipple. Make sure your fingers are well away from   your nipple and your baby's mouth.   Stroke your baby's lips gently with your finger or nipple.   When your baby's mouth is open wide enough, place all of your nipple and as much of the colored area around your nipple (areola) as possible into your baby's mouth.  More areola should be visible above his or her upper lip than below his or her lower lip.  Your  baby's tongue should be between his or her lower gum and your breast.  Ensure that your baby's mouth is correctly positioned around the nipple (latched). Your baby's lips should create a seal on your breast.  Signs that your baby has effectively latched onto your nipple include:  Tugging or sucking without pain.  Swallowing heard between sucks.  Absent click or smacking sound.  Muscle movement above and in front of his or her ears with sucking.  Your baby must suck about 2 3 minutes in order to get your milk. Allow your baby to feed on each breast as long as he or she wants. Nurse your baby until he or she unlatches or falls asleep at the first breast, then offer the second breast.  Signs that your baby is full and satisfied include:  A gradual decrease in the number of sucks or complete cessation of sucking.  Falling asleep.  Extension or relaxation of his or her body.  Retention of a small amount of milk in his or her mouth.  Letting go of your breast by himself or herself.  Signs of effective breastfeeding in you include:  Breasts that have increased firmness, weight, and size prior to feeding.  Breasts that are softer after nursing.  Increased milk volume, as well as a change in milk consistency and color by the 5th day of breastfeeding.  Breast fullness relieved by breastfeeding.  Nipples are not sore, cracked, or bleeding.  If needed, break the suction by putting your finger into the corner of your baby's mouth and sliding your finger between his or her gums. Then, remove your breast from his or her mouth.  It is common for babies to spit up a small amount after a feeding.  Babies often swallow air during feeding. This can make babies fussy. Burping your baby between breasts can help with this.  Vitamin D supplements are recommended for babies who get only breast milk.  Avoid using a pacifier during your baby's first 4 6 weeks.  Avoid supplemental feedings of  water, formula, or juice in place of breastfeeding. Breast milk is all the food your baby needs. It is not necessary for your baby to have water or formula. Your breasts will make more milk if supplemental feedings are avoided during the early weeks. HOW TO TELL WHETHER YOUR BABY IS GETTING ENOUGH BREAST MILK Wondering whether or not your baby is getting enough milk is a common concern among mothers. You can be assured that your baby is getting enough milk if:   Your baby is actively sucking and you hear swallowing.   Your baby seems relaxed and satisfied after a feeding.   Your baby nurses at least 8 12 times in a 24 hour time period.  During the first 3 5 days of age:  Your baby is wetting at least 3 5 diapers in a 24 hour period. The urine should be clear and pale yellow.  Your baby is having at least 3 4 stools in a 24 hour period. The stool should be soft and yellow.  At   5 7 days of age, your baby is having at least 3 6 stools in a 24 hour period. The stool should be seedy and yellow by 5 days of age.  Your baby has a weight loss less than 7 10% during the first 3 days of age.  Your baby does not lose weight after 3 7 days of age.  Your baby gains 4 7 ounces each week after he or she is 4 days of age.  Your baby gains weight by 5 days of age and is back to birth weight within 2 weeks. ENGORGEMENT In the first week after your baby is born, you may experience extremely full breasts (engorgement). When engorged, your breasts may feel heavy, warm, or tender to the touch. Engorgement peaks within 24 48 hours after delivery of your baby.  Engorgement may be reduced by:  Continuing to breastfeed.  Increasing the frequency of breastfeeding.  Taking warm showers or applying warm, moist heat to your breasts just before each feeding. This increases circulation and helps the milk flow.   Gently massaging your breast before and during the feedings. With your fingertips, massage from  your chest wall towards your nipple in a circular motion.   Ensuring that your baby empties at least one breast at every feeding. It also helps to start the next feeding on the opposite breast.   Expressing breast milk by hand or by using a breast pump to empty the breasts if your baby is sleepy, or not nursing well. You may also want to express milk if you are returning to work oryou feel you are getting engorged.  Ensuring your baby is latched on and positioned properly while breastfeeding. If you follow these suggestions, your engorgement should improve in 24 48 hours. If you are still experiencing difficulty, call your lactation consultant or caregiver.  CARING FOR YOURSELF Take care of your breasts.  Bathe or shower daily.   Avoid using soap on your nipples.   Wear a supportive bra. Avoid wearing underwire style bras.  Air dry your nipples for a 3 4minutes after each feeding.   Use only cotton bra pads to absorb breast milk leakage. Leaking of breast milk between feedings is normal.   Use only pure lanolin on your nipples after nursing. You do not need to wash it off before feeding your baby again. Another option is to express a few drops of breast milk and gently massage that milk into your nipples.  Continue breast self-awareness checks. Take care of yourself.  Eat healthy foods. Alternate 3 meals with 3 snacks.  Avoid foods that you notice affect your baby in a bad way.  Drink milk, fruit juice, and water to satisfy your thirst (about 8 glasses a day).   Rest often, relax, and take your prenatal vitamins to prevent fatigue, stress, and anemia.  Avoid chewing and smoking tobacco.  Avoid alcohol and drug use.  Take over-the-counter and prescribed medicine only as directed by your caregiver or pharmacist. You should always check with your caregiver or pharmacist before taking any new medicine, vitamin, or herbal supplement.  Know that pregnancy is possible while  breastfeeding. If desired, talk to your caregiver about family planning and safe birth control methods that may be used while breastfeeding. SEEK MEDICAL CARE IF:   You feel like you want to stop breastfeeding or have become frustrated with breastfeeding.  You have painful breasts or nipples.  Your nipples are cracked or bleeding.  Your breasts are red, tender,   or warm.  You have a swollen area on either breast.  You have a fever or chills.  You have nausea or vomiting.  You have drainage from your nipples.  Your breasts do not become full before feedings by the 5th day after delivery.  You feel sad and depressed.  Your baby is too sleepy to eat well.  Your baby is having trouble sleeping.   Your baby is wetting less than 3 diapers in a 24 hour period.  Your baby has less than 3 stools in a 24 hour period.  Your baby's skin or the white part of his or her eyes becomes more yellow.   Your baby is not gaining weight by 5 days of age. MAKE SURE YOU:   Understand these instructions.  Will watch your condition.  Will get help right away if you are not doing well or get worse. Document Released: 08/28/2005 Document Revised: 05/22/2012 Document Reviewed: 04/03/2012 [employer] Patient Information 2014 [employer].  

## 2013-03-27 NOTE — Progress Notes (Signed)
BP weight and urine results all reviewed and noted. Patient reports good fetal movement, denies any bleeding and no rupture of membranes symptoms or regular contractions. Patient is without complaints. All questions were answered.  

## 2013-04-10 ENCOUNTER — Ambulatory Visit (INDEPENDENT_AMBULATORY_CARE_PROVIDER_SITE_OTHER): Payer: BC Managed Care – PPO | Admitting: Advanced Practice Midwife

## 2013-04-10 ENCOUNTER — Inpatient Hospital Stay (HOSPITAL_COMMUNITY): Admission: AD | Admit: 2013-04-10 | Payer: Self-pay | Source: Ambulatory Visit | Admitting: Obstetrics and Gynecology

## 2013-04-10 ENCOUNTER — Encounter: Payer: Self-pay | Admitting: Advanced Practice Midwife

## 2013-04-10 VITALS — BP 90/60 | Wt 175.0 lb

## 2013-04-10 DIAGNOSIS — Z1389 Encounter for screening for other disorder: Secondary | ICD-10-CM

## 2013-04-10 DIAGNOSIS — Z3403 Encounter for supervision of normal first pregnancy, third trimester: Secondary | ICD-10-CM

## 2013-04-10 DIAGNOSIS — Z331 Pregnant state, incidental: Secondary | ICD-10-CM

## 2013-04-10 DIAGNOSIS — O98519 Other viral diseases complicating pregnancy, unspecified trimester: Secondary | ICD-10-CM

## 2013-04-10 LAB — POCT URINALYSIS DIPSTICK
Nitrite, UA: NEGATIVE
Protein, UA: NEGATIVE

## 2013-04-10 NOTE — Progress Notes (Signed)
Transferred from [place], [place].  Records are  Missing (has twin sister named Philmore Pali) and records were incorrectly merged.  Will request records again from [place]   No c/o at this time.  Routine questions about pregnancy answered.  F/U in 1 weeks for GBS/LROB. Marland Kitchen

## 2013-04-16 ENCOUNTER — Ambulatory Visit (INDEPENDENT_AMBULATORY_CARE_PROVIDER_SITE_OTHER): Payer: BC Managed Care – PPO | Admitting: Obstetrics & Gynecology

## 2013-04-16 ENCOUNTER — Encounter: Payer: Self-pay | Admitting: Obstetrics & Gynecology

## 2013-04-16 VITALS — BP 100/70 | Wt 177.0 lb

## 2013-04-16 DIAGNOSIS — O98519 Other viral diseases complicating pregnancy, unspecified trimester: Secondary | ICD-10-CM

## 2013-04-16 DIAGNOSIS — Z1389 Encounter for screening for other disorder: Secondary | ICD-10-CM

## 2013-04-16 DIAGNOSIS — O0933 Supervision of pregnancy with insufficient antenatal care, third trimester: Secondary | ICD-10-CM

## 2013-04-16 DIAGNOSIS — Z331 Pregnant state, incidental: Secondary | ICD-10-CM

## 2013-04-16 DIAGNOSIS — Z3403 Encounter for supervision of normal first pregnancy, third trimester: Secondary | ICD-10-CM

## 2013-04-16 LAB — POCT URINALYSIS DIPSTICK
Blood, UA: NEGATIVE
Nitrite, UA: NEGATIVE
Protein, UA: NEGATIVE

## 2013-04-16 LAB — OB RESULTS CONSOLE GC/CHLAMYDIA: Chlamydia: NEGATIVE

## 2013-04-16 NOTE — Patient Instructions (Signed)

## 2013-04-16 NOTE — Addendum Note (Signed)
Addended by: Criss Alvine on: 04/16/2013 11:33 AM   Modules accepted: Orders

## 2013-04-16 NOTE — Progress Notes (Signed)
BP weight and urine results all reviewed and noted. Patient reports good fetal movement, denies any bleeding and no rupture of membranes symptoms or regular contractions. Patient is without complaints. All questions were answered.  

## 2013-04-16 NOTE — Progress Notes (Signed)
Having some pain and pressure today. For GBS/ CHL/GC.

## 2013-04-17 LAB — GC/CHLAMYDIA PROBE AMP
CT Probe RNA: NEGATIVE
GC Probe RNA: NEGATIVE

## 2013-04-22 ENCOUNTER — Ambulatory Visit (INDEPENDENT_AMBULATORY_CARE_PROVIDER_SITE_OTHER): Payer: BC Managed Care – PPO | Admitting: Women's Health

## 2013-04-22 ENCOUNTER — Encounter: Payer: Self-pay | Admitting: Women's Health

## 2013-04-22 VITALS — BP 118/72 | Wt 176.4 lb

## 2013-04-22 DIAGNOSIS — Z1389 Encounter for screening for other disorder: Secondary | ICD-10-CM

## 2013-04-22 DIAGNOSIS — Z3403 Encounter for supervision of normal first pregnancy, third trimester: Secondary | ICD-10-CM

## 2013-04-22 DIAGNOSIS — Z331 Pregnant state, incidental: Secondary | ICD-10-CM

## 2013-04-22 DIAGNOSIS — O98519 Other viral diseases complicating pregnancy, unspecified trimester: Secondary | ICD-10-CM

## 2013-04-22 LAB — POCT URINALYSIS DIPSTICK: Nitrite, UA: NEGATIVE

## 2013-04-22 NOTE — Progress Notes (Signed)
Reports good fm. Denies uc's, lof, vb, urinary frequency, urgency, hesitancy, or dysuria.  No complaints.  Reviewed labor s/s, [facility].  All questions answered. F/U in 1wk for visit.  

## 2013-04-22 NOTE — Patient Instructions (Signed)
Call the office or go to [facility] if:  You begin to have strong, frequent contractions  Your water breaks.  Sometimes it is a big gush of fluid, sometimes it is just a trickle that keeps getting your panties wet or running down your legs  You have vaginal bleeding.  It is normal to have a small amount of spotting if your cervix was checked.   You don't feel your baby moving like normal.  If you don't, get you something to eat and drink and lay down and focus on feeling your baby move.  You should feel at least 10 movements in 2 hours.  If you don't, you should call the office or go to [facility].   Braxton Hicks Contractions Pregnancy is commonly associated with contractions of the uterus throughout the pregnancy. Towards the end of pregnancy (32 to 34 weeks), these contractions (Braxton Hicks) can develop more often and may become more forceful. This is not true labor because these contractions do not result in opening (dilatation) and thinning of the cervix. They are sometimes difficult to tell apart from true labor because these contractions can be forceful and people have different pain tolerances. You should not feel embarrassed if you go to the hospital with false labor. Sometimes, the only way to tell if you are in true labor is for your caregiver to follow the changes in the cervix. How to tell the difference between true and false labor:  False labor.  The contractions of false labor are usually shorter, irregular and not as hard as those of true labor.  They are often felt in the front of the lower abdomen and in the groin.  They may leave with walking around or changing positions while lying down.  They get weaker and are shorter lasting as time goes on.  These contractions are usually irregular.  They do not usually become progressively stronger, regular and closer together as with true labor.  True labor.  Contractions in true labor last 30 to 70 seconds,  become very regular, usually become more intense, and increase in frequency.  They do not go away with walking.  The discomfort is usually felt in the top of the uterus and spreads to the lower abdomen and low back.  True labor can be determined by your caregiver with an exam. This will show that the cervix is dilating and getting thinner. If there are no prenatal problems or other health problems associated with the pregnancy, it is completely safe to be sent home with false labor and await the onset of true labor. HOME CARE INSTRUCTIONS   Keep up with your usual exercises and instructions.  Take medications as directed.  Keep your regular prenatal appointment.  Eat and drink lightly if you think you are going into labor.  If BH contractions are making you uncomfortable:  Change your activity position from lying down or resting to walking/walking to resting.  Sit and rest in a tub of warm water.  Drink 2 to 3 glasses of water. Dehydration may cause B-H contractions.  Do slow and deep breathing several times an hour. SEEK IMMEDIATE MEDICAL CARE IF:   Your contractions continue to become stronger, more regular, and closer together.  You have a gushing, burst or leaking of fluid from the vagina.  An oral temperature above 102 F (38.9 C) develops.  You have passage of blood-tinged mucus.  You develop vaginal bleeding.  You develop continuous belly (abdominal) pain.  You have   low back pain that you never had before.  You feel the baby's head pushing down causing pelvic pressure.  The baby is not moving as much as it used to. Document Released: 08/28/2005 Document Revised: 11/20/2011 Document Reviewed: 02/19/2009 [employer] Patient Information 2014 [employer].  

## 2013-05-01 ENCOUNTER — Encounter: Payer: BC Managed Care – PPO | Admitting: Obstetrics & Gynecology

## 2013-05-05 ENCOUNTER — Encounter: Payer: Self-pay | Admitting: Obstetrics & Gynecology

## 2013-05-05 ENCOUNTER — Ambulatory Visit (INDEPENDENT_AMBULATORY_CARE_PROVIDER_SITE_OTHER): Payer: BC Managed Care – PPO | Admitting: Obstetrics & Gynecology

## 2013-05-05 VITALS — BP 100/70 | Wt 177.0 lb

## 2013-05-05 DIAGNOSIS — Z1389 Encounter for screening for other disorder: Secondary | ICD-10-CM

## 2013-05-05 DIAGNOSIS — Z3403 Encounter for supervision of normal first pregnancy, third trimester: Secondary | ICD-10-CM

## 2013-05-05 DIAGNOSIS — Z331 Pregnant state, incidental: Secondary | ICD-10-CM

## 2013-05-05 DIAGNOSIS — O0930 Supervision of pregnancy with insufficient antenatal care, unspecified trimester: Secondary | ICD-10-CM

## 2013-05-05 LAB — POCT URINALYSIS DIPSTICK
Blood, UA: 1
Glucose, UA: NEGATIVE
Ketones, UA: NEGATIVE

## 2013-05-05 NOTE — Progress Notes (Signed)
Low vertex. BP weight and urine results all reviewed and noted. Patient reports good fetal movement, denies any bleeding and no rupture of membranes symptoms or regular contractions. Patient is without complaints. All questions were answered.

## 2013-05-05 NOTE — Patient Instructions (Signed)
Labor Induction  Most women go into labor on their own between 37 and 42 weeks of the pregnancy. When this does not happen or when there is a medical need, medicine or other methods may be used to induce labor. Labor induction causes a pregnant woman's uterus to contract. It also causes the cervix to soften (ripen), open (dilate), and thin out (efface). Usually, labor is not induced before 39 weeks of the pregnancy unless there is a problem with the baby or mother. Whether your labor will be induced depends on a number of factors, including the following:  The medical condition of you and the baby.  How many weeks along you are.  The status of baby's lung maturity.  The condition of the cervix.  The position of the baby. REASONS FOR LABOR INDUCTION  The health of the baby or mother is at risk.  The pregnancy is overdue by 1 week or more.  The water breaks but labor does not start on its own.  The mother has a health condition or serious illness such as high blood pressure, infection, placental abruption, or diabetes.  The amniotic fluid amounts are low around the baby.  The baby is distressed. REASONS TO NOT INDUCE LABOR Labor induction may not be a good idea if:  It is shown that your baby does not tolerate labor.  An induction is just more convenient.  You want the baby to be born on a certain date, like a holiday.  You have had previous surgeries on your uterus, such as a myomectomy or the removal of fibroids.  Your placenta lies very low in the uterus and blocks the opening of the cervix (placenta previa).  Your baby is not in a head down position.  The umbilical cord drops down into the birth canal in front of the baby. This could cut off the baby's blood and oxygen supply.  You have had a previous cesarean delivery.  There areunusual circumstances, such as the baby being extremely premature. RISKS AND COMPLICATIONS Problems may occur in the process of induction  and plans may need to be modified as a situation unfolds. Some of the risks of induction include:  Change in fetal heart rate, such as too high, too low, or erratic.  Risk of fetal distress.  Risk of infection to mother and baby.  Increased chance of having a cesarean delivery.  The rare, but increased chance that the placenta will separate from the uterus (abruption).  Uterine rupture (very rare). When induction is needed for medical reasons, the benefits of induction may outweigh the risks. BEFORE THE PROCEDURE Your caregiver will check your cervix and the baby's position. This will help your caregiver decide if you are far enough along for an induction to work. PROCEDURE Several methods of labor induction may be used, such as:   Taking prostaglandin medicine to dilate and ripen the cervix. The medicine will also start contractions. It can be taken by mouth or by inserting a suppository into the vagina.  A thin tube (catheter) with a balloon on the end may be inserted into your vagina to dilate the cervix. Once inserted, the balloon expands with water, which causes the cervix to open.  Striping the membranes. Your caregiver inserts a finger between the cervix and membranes, which causes the cervix to be stretched and may cause the uterus to contract. This is often done during an office visit. You will be sent home to wait for the contractions to begin. You will   then come in for an induction.  Breaking the water. Your caregiver will make a hole in the amniotic sac using a small instrument. Once the amniotic sac breaks, contractions should begin. This may still take hours to see an effect.  Taking medicine to trigger or strengthen contractions. This medicine is given intravenously through a tube in your arm. All of the methods of induction, besides stripping the membranes, will be done in the hospital. Induction is done in the hospital so that you and the baby can be carefully  monitored. AFTER THE PROCEDURE Some inductions can take up to 2 or 3 days. Depending on the cervix, it usually takes less time. It takes longer when you are induced early in the pregnancy or if this is your first pregnancy. If a mother is still pregnant and the induction has been going on for 2 to 3 days, either the mother will be sent home or a cesarean delivery will be needed. Document Released: 01/17/2007 Document Revised: 11/20/2011 Document Reviewed: 07/03/2011 [employer] Patient Information 2014 [employer].  

## 2013-05-05 NOTE — Addendum Note (Signed)
Addended by: Richardson Chiquito on: 05/05/2013 05:37 PM   Modules accepted: Orders

## 2013-05-06 ENCOUNTER — Encounter (HOSPITAL_COMMUNITY): Payer: Self-pay | Admitting: *Deleted

## 2013-05-06 ENCOUNTER — Telehealth (HOSPITAL_COMMUNITY): Payer: Self-pay | Admitting: *Deleted

## 2013-05-06 NOTE — Telephone Encounter (Signed)
Preadmission screen  

## 2013-05-11 ENCOUNTER — Inpatient Hospital Stay (HOSPITAL_COMMUNITY)
Admission: RE | Admit: 2013-05-11 | Discharge: 2013-05-15 | DRG: 372 | Disposition: A | Discharge status: Home / Self Care | Payer: BC Managed Care – PPO | Source: Ambulatory Visit | Attending: Obstetrics & Gynecology | Admitting: Obstetrics & Gynecology

## 2013-05-11 ENCOUNTER — Encounter (HOSPITAL_COMMUNITY): Payer: Self-pay

## 2013-05-11 DIAGNOSIS — O480 Post-term pregnancy: Principal | ICD-10-CM | POA: Diagnosis present

## 2013-05-11 DIAGNOSIS — O411090 Infection of amniotic sac and membranes, unspecified, unspecified trimester, not applicable or unspecified: Secondary | ICD-10-CM | POA: Diagnosis present

## 2013-05-11 LAB — CBC
HCT: 35.1 % — ABNORMAL LOW (ref 36.0–46.0)
Hemoglobin: 12.7 g/dL (ref 12.0–15.0)
MCH: 31.7 pg (ref 26.0–34.0)
MCV: 87.5 fL (ref 78.0–100.0)
Platelets: 146 10*3/uL — ABNORMAL LOW (ref 150–400)
RBC: 4.01 MIL/uL (ref 3.87–5.11)
WBC: 8.5 10*3/uL (ref 4.0–10.5)

## 2013-05-11 MED ORDER — ZOLPIDEM TARTRATE 5 MG PO TABS
5.0000 mg | ORAL_TABLET | Freq: Every evening | ORAL | Status: DC | PRN
  Administered 2013-05-11: 5 mg via ORAL
  Filled 2013-05-11: qty 1

## 2013-05-11 MED ORDER — OXYCODONE-ACETAMINOPHEN 5-325 MG PO TABS: 1.0000 | ORAL_TABLET | ORAL | Status: DC | PRN

## 2013-05-11 MED ORDER — ONDANSETRON HCL 4 MG/2ML IJ SOLN: 4.0000 mg | Freq: Four times a day (QID) | INTRAMUSCULAR | Status: DC | PRN

## 2013-05-11 MED ORDER — TERBUTALINE SULFATE 1 MG/ML IJ SOLN: 0.2500 mg | Freq: Once | INTRAMUSCULAR | Status: AC | PRN

## 2013-05-11 MED ORDER — LACTATED RINGERS IV SOLN
500.0000 mL | INTRAVENOUS | Status: DC | PRN
  Administered 2013-05-13 (×2): 500 mL via INTRAVENOUS

## 2013-05-11 MED ORDER — MISOPROSTOL 25 MCG QUARTER TABLET
25.0000 ug | ORAL_TABLET | ORAL | Status: DC | PRN
  Administered 2013-05-11 – 2013-05-12 (×3): 25 ug via VAGINAL
  Filled 2013-05-11: qty 1
  Filled 2013-05-11 (×3): qty 0.25

## 2013-05-11 MED ORDER — LACTATED RINGERS IV SOLN
INTRAVENOUS | Status: DC
  Administered 2013-05-11 – 2013-05-13 (×6): via INTRAVENOUS

## 2013-05-11 MED ORDER — ACYCLOVIR 400 MG PO TABS
400.0000 mg | ORAL_TABLET | Freq: Three times a day (TID) | ORAL | Status: DC
  Administered 2013-05-11 – 2013-05-13 (×6): 400 mg via ORAL
  Filled 2013-05-11 (×8): qty 1

## 2013-05-11 MED ORDER — FENTANYL CITRATE 0.05 MG/ML IJ SOLN
100.0000 ug | INTRAMUSCULAR | Status: DC | PRN
  Administered 2013-05-12 (×3): 100 ug via INTRAVENOUS
  Filled 2013-05-11 (×3): qty 2

## 2013-05-11 MED ORDER — OXYTOCIN BOLUS FROM INFUSION
500.0000 mL | INTRAVENOUS | Status: DC
  Administered 2013-05-13: 500 mL via INTRAVENOUS

## 2013-05-11 MED ORDER — CITRIC ACID-SODIUM CITRATE 334-500 MG/5ML PO SOLN: 30.0000 mL | ORAL | Status: DC | PRN

## 2013-05-11 MED ORDER — ACETAMINOPHEN 325 MG PO TABS: 650.0000 mg | ORAL_TABLET | ORAL | Status: DC | PRN

## 2013-05-11 MED ORDER — LIDOCAINE HCL (PF) 1 % IJ SOLN
30.0000 mL | INTRAMUSCULAR | Status: DC | PRN
  Filled 2013-05-11 (×2): qty 30

## 2013-05-11 MED ORDER — IBUPROFEN 600 MG PO TABS
600.0000 mg | ORAL_TABLET | Freq: Four times a day (QID) | ORAL | Status: DC | PRN
  Administered 2013-05-13: 600 mg via ORAL
  Filled 2013-05-11: qty 1

## 2013-05-11 MED ORDER — OXYTOCIN 40 UNITS IN LACTATED RINGERS INFUSION - SIMPLE MED
62.5000 mL/h | INTRAVENOUS | Status: DC
  Administered 2013-05-13: 62.5 mL/h via INTRAVENOUS
  Filled 2013-05-11: qty 1000

## 2013-05-12 ENCOUNTER — Inpatient Hospital Stay (HOSPITAL_COMMUNITY): Payer: BC Managed Care – PPO | Admitting: Anesthesiology

## 2013-05-12 ENCOUNTER — Encounter (HOSPITAL_COMMUNITY): Payer: Self-pay | Admitting: Anesthesiology

## 2013-05-12 LAB — TYPE AND SCREEN: Antibody Screen: NEGATIVE

## 2013-05-12 LAB — ABO/RH: ABO/RH(D): A POS

## 2013-05-12 LAB — OB RESULTS CONSOLE ABO/RH

## 2013-05-12 MED ORDER — PHENYLEPHRINE 40 MCG/ML (10ML) SYRINGE FOR IV PUSH (FOR BLOOD PRESSURE SUPPORT)
80.0000 ug | PREFILLED_SYRINGE | INTRAVENOUS | Status: DC | PRN
  Filled 2013-05-12: qty 2

## 2013-05-12 MED ORDER — DIPHENHYDRAMINE HCL 50 MG/ML IJ SOLN: 12.5000 mg | INTRAMUSCULAR | Status: DC | PRN

## 2013-05-12 MED ORDER — EPHEDRINE 5 MG/ML INJ
10.0000 mg | INTRAVENOUS | Status: DC | PRN
  Filled 2013-05-12: qty 2

## 2013-05-12 MED ORDER — FENTANYL 2.5 MCG/ML BUPIVACAINE 1/10 % EPIDURAL INFUSION (WH - ANES)
14.0000 mL/h | INTRAMUSCULAR | Status: DC | PRN
  Administered 2013-05-12 – 2013-05-13 (×3): 14 mL/h via EPIDURAL
  Filled 2013-05-12 (×3): qty 125

## 2013-05-12 MED ORDER — PHENYLEPHRINE 40 MCG/ML (10ML) SYRINGE FOR IV PUSH (FOR BLOOD PRESSURE SUPPORT)
80.0000 ug | PREFILLED_SYRINGE | INTRAVENOUS | Status: DC | PRN
  Filled 2013-05-12: qty 2
  Filled 2013-05-12: qty 5

## 2013-05-12 MED ORDER — OXYTOCIN 40 UNITS IN LACTATED RINGERS INFUSION - SIMPLE MED
1.0000 m[IU]/min | INTRAVENOUS | Status: DC
  Administered 2013-05-12: 2 m[IU]/min via INTRAVENOUS

## 2013-05-12 MED ORDER — TERBUTALINE SULFATE 1 MG/ML IJ SOLN: 0.2500 mg | Freq: Once | INTRAMUSCULAR | Status: AC | PRN

## 2013-05-12 MED ORDER — EPHEDRINE 5 MG/ML INJ
10.0000 mg | INTRAVENOUS | Status: DC | PRN
  Filled 2013-05-12: qty 2
  Filled 2013-05-12: qty 4

## 2013-05-12 MED ORDER — LIDOCAINE HCL (PF) 1 % IJ SOLN
INTRAMUSCULAR | Status: DC | PRN
  Administered 2013-05-12 (×4): 4 mL

## 2013-05-12 MED ORDER — LACTATED RINGERS IV SOLN: 500.0000 mL | Freq: Once | INTRAVENOUS | Status: DC

## 2013-05-12 NOTE — Anesthesia Preprocedure Evaluation (Signed)
Anesthesia Evaluation  Patient identified by MRN, date of birth, ID band Patient awake    Reviewed: Allergy & Precautions, H&P , NPO status , Patient's Chart, lab work & pertinent test results, reviewed documented beta blocker date and time   History of Anesthesia Complications Negative for: history of anesthetic complications  Airway Mallampati: III TM Distance: >3 FB Neck ROM: full    Dental  (+) Teeth Intact Broken molar bottom left:   Pulmonary neg pulmonary ROS,  breath sounds clear to auscultation        Cardiovascular negative cardio ROS  Rhythm:regular Rate:Normal     Neuro/Psych negative neurological ROS  negative psych ROS   GI/Hepatic negative GI ROS, Neg liver ROS,   Endo/Other  negative endocrine ROS  Renal/GU negative Renal ROS     Musculoskeletal   Abdominal   Peds  Hematology negative hematology ROS (+)   Anesthesia Other Findings   Reproductive/Obstetrics (+) Pregnancy                           Anesthesia Physical Anesthesia Plan  ASA: II  Anesthesia Plan: Epidural   Post-op Pain Management:    Induction:   Airway Management Planned:   Additional Equipment:   Intra-op Plan:   Post-operative Plan:   Informed Consent: I have reviewed the patients History and Physical, chart, labs and discussed the procedure including the risks, benefits and alternatives for the proposed anesthesia with the patient or authorized representative who has indicated his/her understanding and acceptance.     Plan Discussed with:   Anesthesia Plan Comments:         Anesthesia Quick Evaluation

## 2013-05-12 NOTE — H&P (Cosign Needed)
Amanda Perez is a 22 y.o. female presenting for IOL for Post dates. Pt has no other medical conditions. Maternal Medical History:  Reason for admission: Nausea.    OB History   Grav Para Term Preterm Abortions TAB SAB Ect Mult Living   1              Past Medical History  Diagnosis Date  . Medical history non-contributory    Past Surgical History  Procedure Laterality Date  . No past surgeries     Family History: family history includes Hypertension in her mother; Seizures in her mother. Social History:  reports that she has never smoked. She has never used smokeless tobacco. She reports that she does not drink alcohol or use illicit drugs.   Prenatal Transfer Tool  Maternal Diabetes: No Genetic Screening: Too late to care Maternal Ultrasounds/Referrals: Normal Fetal Ultrasounds or other Referrals:  None Maternal Substance Abuse:  No Significant Maternal Medications:  None Significant Maternal Lab Results:  Lab values include: Group B Strep negative Other Comments:  None  Review of Systems  Constitutional: Negative for fever and chills.  Eyes: Negative for blurred vision.  Respiratory: Negative for shortness of breath.   Cardiovascular: Negative for chest pain.  Gastrointestinal: Negative for heartburn, nausea, vomiting and abdominal pain.  Neurological: Negative for headaches.    Dilation: 1 Effacement (%): 50 Station: -3 Exam by:: Amanda Perez (by ultrasound) Blood pressure 103/54, pulse 97, temperature 98.7 F (37.1 C), temperature source Oral, resp. rate 18, height 5\' 3"  (1.6 m), weight 80.287 kg (177 lb), last menstrual period 08/29/2012. Exam Physical Exam  Nursing note and vitals reviewed. Constitutional: She appears well-developed and well-nourished. No distress.  HENT:  Head: Normocephalic and atraumatic.  Eyes: EOM are normal.  Neck: Normal range of motion. Neck supple.  Cardiovascular: Normal rate, regular rhythm, normal heart sounds and intact distal  pulses.  Exam reveals no gallop and no friction rub.   No murmur heard. Respiratory: Effort normal and breath sounds normal. No respiratory distress. She has no wheezes. She has no rales. She exhibits no tenderness.  GI: Soft. Bowel sounds are normal. She exhibits no distension and no mass. There is no tenderness. There is no rebound and no guarding.  Skin: She is not diaphoretic.    SVE: VTX  FHT 130s mult accels, no decls mod variability Toco: now q54min  Prenatal labs: ABO, Rh:   Antibody: NEG (06/12 0912) Rubella:   RPR: NON REAC (06/12 0912)  HBsAg:    HIV: NON REACTIVE (06/12 0912)  GBS: NEGATIVE (08/06 1415)   Assessment/Plan: Amanda Perez is a 22 y.o. G1P0 at [redacted]w[redacted]d here for IOL for post dates #Labor: IOL start with Cycotec PV #Pain: Desires epidural when in pain #FWB: Cat I #ID: Gbs neg #MOF: Breast #MOC: Nexplanon/undecided  Amanda Perez 05/12/2013, 12:17 AM

## 2013-05-12 NOTE — Anesthesia Procedure Notes (Signed)
Epidural Patient location during procedure: OB Start time: 05/12/2013 10:57 PM  Staffing Performed by: anesthesiologist   Preanesthetic Checklist Completed: patient identified, site marked, surgical consent, pre-op evaluation, timeout performed, IV checked, risks and benefits discussed and monitors and equipment checked  Epidural Patient position: sitting Prep: site prepped and draped and DuraPrep Patient monitoring: continuous pulse ox and blood pressure Approach: midline Injection technique: LOR air  Needle:  Needle type: Tuohy  Needle gauge: 17 G Needle length: 9 cm and 9 Needle insertion depth: 5.5 cm Catheter type: closed end flexible Catheter size: 19 Gauge Catheter at skin depth: 10.5 cm Test dose: negative  Assessment Events: blood not aspirated, injection not painful, no injection resistance, negative IV test and no paresthesia  Additional Notes Discussed risk of headache, infection, bleeding, nerve injury and failed or incomplete block.  Patient voices understanding and wishes to proceed.  Epidural placed easily on first attempt.  No paresthesia.  Patient tolerated procedure well with no apparent complications.  Jasmine December, MD Reason for block:procedure for pain

## 2013-05-12 NOTE — Progress Notes (Signed)
LAUNA GOEDKEN is a 22 y.o. G1P0 at [redacted]w[redacted]d admitted for induction of labor due to Post dates. .  Subjective: Pt sleeping when entering room, no complaints at this time. Not feeling ctx  Objective: BP 107/51  Pulse 75  Temp(Src) 98.7 F (37.1 C) (Oral)  Resp 18  Ht 5\' 3"  (1.6 m)  Wt 80.287 kg (177 lb)  BMI 31.36 kg/m2  LMP 08/29/2012      FHT:  FHR: 130s bpm, variability: moderate,  accelerations:  Abscent,  decelerations:  Absent UC:   irregular, every 2-8 minutes SVE:   Dilation: 2 Effacement (%): 70 Station: 0 Exam by:: Brynne Doane  Labs: Lab Results  Component Value Date   WBC 8.5 05/11/2013   HGB 12.7 05/11/2013   HCT 35.1* 05/11/2013   MCV 87.5 05/11/2013   PLT 146* 05/11/2013    Assessment / Plan: Induction of labor due to postterm,  progressing well on pitocin  Labor: making change on cytotec, will give third dose. unable to place foley bulb at this time. Preeclampsia:  no signs or symptoms of toxicity Fetal Wellbeing:  Category I Pain Control:  Labor support without medications but desires intervention when worsening pain I/D:  n/a Anticipated MOD:  NSVD  Airiel Oblinger RYAN 05/12/2013, 6:58 AM

## 2013-05-12 NOTE — Progress Notes (Signed)
Amanda Perez is a 22 y.o. G1P0 at [redacted]w[redacted]d by ultrasound admitted for induction of labor due to Post dates. .  Subjective: Doing well. Starting to feel some cramping   Objective: BP 94/41  Pulse 60  Temp(Src) 98.3 F (36.8 C) (Oral)  Resp 18  Ht 5\' 3"  (1.6 m)  Wt 80.287 kg (177 lb)  BMI 31.36 kg/m2  LMP 08/29/2012      FHT:  FHR: 140 bpm, variability: moderate,  accelerations:  Present,  decelerations:  Absent UC:   regular, every 2 minutes SVE:   Dilation: 2 Effacement (%): 70 Station: 0 Exam by:: Odom  Labs: Lab Results  Component Value Date   WBC 8.5 05/11/2013   HGB 12.7 05/11/2013   HCT 35.1* 05/11/2013   MCV 87.5 05/11/2013   PLT 146* 05/11/2013    Assessment / Plan: Induction of labor due to postterm,  progressing well on pitocin  Labor: Progressing normally Preeclampsia:  n/a Fetal Wellbeing:  Category I Pain Control:  Labor support without medications I/D:  n/a Anticipated MOD:  NSVD  Delilah Mulgrew 05/12/2013, 8:54 AM

## 2013-05-12 NOTE — Progress Notes (Signed)
Patient ID: Amanda Perez, female   DOB: 1991-07-27, 22 y.o.   MRN: [id number]  Doing well. Walking off and on  Filed Vitals:   05/12/13 0605 05/12/13 0654 05/12/13 0800 05/12/13 1203  BP: 107/51 103/56 94/41 97/52   Pulse: 75 62 60 65  Temp:   98.3 F (36.8 C) 98.3 F (36.8 C)  TempSrc:   Oral Oral  Resp: 18 18 18 18   Height:      Weight:        FHR reactive with good accels UCs continue every 1-2 minutes, mild to moderate per pt  Cervix deferred  Will continue to observe and start Pitocin when UCs lessen

## 2013-05-12 NOTE — Progress Notes (Signed)
Patient ID: Amanda Perez, female   DOB: 02-Jan-1991, 22 y.o.   MRN: [id number]  S/O:  Doing well, cramping, but does not want pain med now          Hungry    Filed Vitals:   05/12/13 0504 05/12/13 0605 05/12/13 0654 05/12/13 0800  BP:  107/51 103/56 94/41  Pulse:  75 62 60  Temp:    98.3 F (36.8 C)  TempSrc:    Oral  Resp: 18 18 18 18   Height:      Weight:               FHR reactive        UCs every 1.5 minutes  Dilation: 2 Effacement (%): 80 Cervical Position: Posterior Station: 0 Presentation: Vertex Exam by:: Artelia Laroche CNM  A:  Latent phase labor with effaced cervix  P:  WIll observe for spacing out of UCs      Does not need any more Cytotec      Unable to reach cervix to insert foley      When UCs space out, will start Pitocin

## 2013-05-12 NOTE — Progress Notes (Signed)
Patient ID: Amanda Perez, female   DOB: 1991-06-24, 22 y.o.   MRN: [id number]  Doing well  AVSS Filed Vitals:   05/12/13 1555 05/12/13 1623 05/12/13 1631 05/12/13 1700  BP: 105/67 117/71 116/75 107/63  Pulse: 73 64 71 58  Temp: 98.5 F (36.9 C)     TempSrc: Oral     Resp: 18     Height:      Weight:       FHR reactive UCs slowed toq 2-7 minutes  Will start Pitocin augmentation now.

## 2013-05-13 ENCOUNTER — Encounter (HOSPITAL_COMMUNITY): Payer: Self-pay

## 2013-05-13 DIAGNOSIS — O480 Post-term pregnancy: Secondary | ICD-10-CM

## 2013-05-13 DIAGNOSIS — O411090 Infection of amniotic sac and membranes, unspecified, unspecified trimester, not applicable or unspecified: Secondary | ICD-10-CM

## 2013-05-13 LAB — CBC
MCH: 31.7 pg (ref 26.0–34.0)
MCHC: 36.4 g/dL — ABNORMAL HIGH (ref 30.0–36.0)
Platelets: 120 10*3/uL — ABNORMAL LOW (ref 150–400)
RBC: 3.75 MIL/uL — ABNORMAL LOW (ref 3.87–5.11)
RDW: 12.9 % (ref 11.5–15.5)

## 2013-05-13 MED ORDER — TETANUS-DIPHTH-ACELL PERTUSSIS 5-2.5-18.5 LF-MCG/0.5 IM SUSP
0.5000 mL | Freq: Once | INTRAMUSCULAR | Status: AC
  Administered 2013-05-15: 0.5 mL via INTRAMUSCULAR
  Filled 2013-05-13 (×2): qty 0.5

## 2013-05-13 MED ORDER — LANOLIN HYDROUS EX OINT: TOPICAL_OINTMENT | CUTANEOUS | Status: DC | PRN

## 2013-05-13 MED ORDER — GENTAMICIN SULFATE 40 MG/ML IJ SOLN
150.0000 mg | Freq: Three times a day (TID) | INTRAVENOUS | Status: DC
  Filled 2013-05-13 (×2): qty 3.75

## 2013-05-13 MED ORDER — ONDANSETRON HCL 4 MG/2ML IJ SOLN: 4.0000 mg | INTRAMUSCULAR | Status: DC | PRN

## 2013-05-13 MED ORDER — BENZOCAINE-MENTHOL 20-0.5 % EX AERO
1.0000 "application " | INHALATION_SPRAY | CUTANEOUS | Status: DC | PRN
  Administered 2013-05-14: 1 via TOPICAL
  Filled 2013-05-13: qty 56

## 2013-05-13 MED ORDER — SENNOSIDES-DOCUSATE SODIUM 8.6-50 MG PO TABS
2.0000 | ORAL_TABLET | Freq: Every day | ORAL | Status: DC
  Administered 2013-05-14: 2 via ORAL

## 2013-05-13 MED ORDER — WITCH HAZEL-GLYCERIN EX PADS: 1.0000 "application " | MEDICATED_PAD | CUTANEOUS | Status: DC | PRN

## 2013-05-13 MED ORDER — METHYLERGONOVINE MALEATE 0.2 MG/ML IJ SOLN
INTRAMUSCULAR | Status: AC
  Administered 2013-05-13: 0.2 mg
  Filled 2013-05-13: qty 1

## 2013-05-13 MED ORDER — IBUPROFEN 600 MG PO TABS
600.0000 mg | ORAL_TABLET | Freq: Four times a day (QID) | ORAL | Status: DC
  Administered 2013-05-14 – 2013-05-15 (×7): 600 mg via ORAL
  Filled 2013-05-13 (×6): qty 1

## 2013-05-13 MED ORDER — OXYTOCIN 40 UNITS IN LACTATED RINGERS INFUSION - SIMPLE MED
INTRAVENOUS | Status: AC
  Administered 2013-05-13: 40 [IU]
  Filled 2013-05-13: qty 1000

## 2013-05-13 MED ORDER — METHYLERGONOVINE MALEATE 0.2 MG PO TABS
0.2000 mg | ORAL_TABLET | Freq: Four times a day (QID) | ORAL | Status: AC
  Administered 2013-05-14 – 2013-05-15 (×6): 0.2 mg via ORAL
  Filled 2013-05-13 (×5): qty 1

## 2013-05-13 MED ORDER — ONDANSETRON HCL 4 MG PO TABS: 4.0000 mg | ORAL_TABLET | ORAL | Status: DC | PRN

## 2013-05-13 MED ORDER — PRENATAL MULTIVITAMIN CH
1.0000 | ORAL_TABLET | Freq: Every day | ORAL | Status: DC
  Administered 2013-05-14 – 2013-05-15 (×2): 1 via ORAL
  Filled 2013-05-13 (×2): qty 1

## 2013-05-13 MED ORDER — ZOLPIDEM TARTRATE 5 MG PO TABS: 5.0000 mg | ORAL_TABLET | Freq: Every evening | ORAL | Status: DC | PRN

## 2013-05-13 MED ORDER — SIMETHICONE 80 MG PO CHEW: 80.0000 mg | CHEWABLE_TABLET | ORAL | Status: DC | PRN

## 2013-05-13 MED ORDER — METHYLERGONOVINE MALEATE 0.2 MG/ML IJ SOLN: 0.2000 mg | Freq: Four times a day (QID) | INTRAMUSCULAR | Status: AC

## 2013-05-13 MED ORDER — METHYLERGONOVINE MALEATE 0.2 MG/ML IJ SOLN: 0.2000 mg | Freq: Once | INTRAMUSCULAR | Status: DC

## 2013-05-13 MED ORDER — DIPHENHYDRAMINE HCL 25 MG PO CAPS: 25.0000 mg | ORAL_CAPSULE | Freq: Four times a day (QID) | ORAL | Status: DC | PRN

## 2013-05-13 MED ORDER — OXYCODONE-ACETAMINOPHEN 5-325 MG PO TABS
1.0000 | ORAL_TABLET | ORAL | Status: DC | PRN
  Administered 2013-05-14: 1 via ORAL
  Filled 2013-05-13: qty 1

## 2013-05-13 MED ORDER — ACETAMINOPHEN 500 MG PO TABS
1000.0000 mg | ORAL_TABLET | Freq: Four times a day (QID) | ORAL | Status: DC | PRN
  Administered 2013-05-13 (×2): 1000 mg via ORAL
  Filled 2013-05-13 (×2): qty 2

## 2013-05-13 MED ORDER — SODIUM CHLORIDE 0.9 % IV SOLN
2.0000 g | Freq: Four times a day (QID) | INTRAVENOUS | Status: DC
  Administered 2013-05-13 (×2): 2 g via INTRAVENOUS
  Filled 2013-05-13 (×4): qty 2000

## 2013-05-13 MED ORDER — GENTAMICIN SULFATE 40 MG/ML IJ SOLN
160.0000 mg | Freq: Once | INTRAVENOUS | Status: AC
  Administered 2013-05-13: 160 mg via INTRAVENOUS
  Filled 2013-05-13: qty 4

## 2013-05-13 MED ORDER — DIBUCAINE 1 % RE OINT: 1.0000 "application " | TOPICAL_OINTMENT | RECTAL | Status: DC | PRN

## 2013-05-13 NOTE — Progress Notes (Signed)
Amanda Perez is a 22 y.o. G1P0 at [redacted]w[redacted]d admitted for induction of labor due to Post dates. Due date 05/05/2013.  Subjective: comfortable in bed. No complaints.    Objective: BP 102/56  Pulse 60  Temp(Src) 97.9 F (36.6 C) (Oral)  Resp 16  Ht 5\' 3"  (1.6 m)  Wt 80.287 kg (177 lb)  BMI 31.36 kg/m2  SpO2 99%  LMP 08/29/2012      FHT:  FHR: 135 bpm, variability: moderate,  accelerations:  Present,  decelerations:  Absent UC:   regular, every 2-3 minutes SVE:   Dilation: 3.5 Effacement (%): 80 Station: 0 Exam by:: [employer] RN  Labs: Lab Results  Component Value Date   WBC 8.5 05/11/2013   HGB 12.7 05/11/2013   HCT 35.1* 05/11/2013   MCV 87.5 05/11/2013   PLT 146* 05/11/2013    Assessment / Plan: Induction of labor due to postterm,  progressing well on pitocin  Labor: Progressing on Pitocin Preeclampsia:  n/a Fetal Wellbeing:  Category I Pain Control:  Fentanyl I/D:  Acyclovir for HSV-2 Anticipated MOD:  NSVD  Jacquelin Hawking, MD 05/12/2013, 9:35 PM

## 2013-05-13 NOTE — Progress Notes (Signed)
ANTIBIOTIC CONSULT NOTE - INITIAL  Pharmacy Consult for Gentamicin Indication: increased temp, r/o Chorioamnionitis No Known Allergies  Patient Measurements: Height: 5\' 3"  (160 cm) Weight: 177 lb (80.287 kg) IBW/kg (Calculated) : 52.4 Adjusted Body Weight: 60.8 KG  Vital Signs: Temp: 100.1 F (37.8 C) (09/02 1301) Temp src: Axillary (09/02 1301) BP: 110/69 mmHg (09/02 1331) Pulse Rate: 91 (09/02 1331) Intake/Output from previous day: 09/01 0701 - 09/02 0700 In: -  Out: 800 [Urine:800]    CrCl is unknown because no creatinine reading has been taken.   Medical History: Past Medical History  Diagnosis Date  . Medical history non-contributory     Medications: Also receiving Ampicillin 2 g IV Q 6 hours.  Assessment: Pt. > [redacted] weeks pregnant,  r/o chorioamnionitis   Goal of Therapy: Peak 6-7mg /L, Trough < 1mg /L  Plan: (1) Load Gentamicin 160mg  IV.           (2) Start Gent Maintenance 150mg  IV Q8.  To begin 8 hours after load.           (3) Draw Serum creatinine if continued.       Hovey-Rankin, Donyea Gafford 05/13/2013,1:56 PM

## 2013-05-13 NOTE — Progress Notes (Signed)
Amanda Perez is a 22 y.o. G1P0 at [redacted]w[redacted]d admitted for induction of labor due to Post dates. Due date 05/05/2013.  Subjective: comfortable with no complaints.   Objective: BP 106/62  Pulse 60  Temp(Src) 98.6 F (37 C) (Oral)  Resp 18  Ht 5\' 3"  (1.6 m)  Wt 80.287 kg (177 lb)  BMI 31.36 kg/m2  SpO2 99%  LMP 08/29/2012      FHT:  FHR: 120 bpm, variability: moderate,  accelerations:  Present,  decelerations:  Absent UC:   regular, every 2-4 minutes SVE:   Dilation: 7 Effacement (%): 90 Station: +1 Exam by:: Dr. Caleb Popp  Labs: Lab Results  Component Value Date   WBC 8.5 05/11/2013   HGB 12.7 05/11/2013   HCT 35.1* 05/11/2013   MCV 87.5 05/11/2013   PLT 146* 05/11/2013    Assessment / Plan: Induction of labor due to postterm, s/p SROM  Labor: Progressing normally Preeclampsia:  n/a Fetal Wellbeing:  Category I Pain Control:  Epidural I/D:  Acyclovir for HSV-2 Anticipated MOD:  NSVD  Jacquelin Hawking, MD 05/13/2013, 3:49 AM

## 2013-05-13 NOTE — Progress Notes (Signed)
KRISHNA HEUER is a 22 y.o. G1P0 at [redacted]w[redacted]d admitted for induction of labor due to Post dates. Due date 05/05/2013.  Subjective: Currently comfortable. Feeling pressure during contractions but no complaints   Objective: BP 121/88  Pulse 62  Temp(Src) 97.9 F (36.6 C) (Oral)  Resp 18  Ht 5\' 3"  (1.6 m)  Wt 80.287 kg (177 lb)  BMI 31.36 kg/m2  SpO2 99%  LMP 08/29/2012      FHT:  FHR: 135 bpm, variability: moderate,  accelerations:  Present,  decelerations:  Absent UC:   regular, every 3-4 minutes SVE:   Dilation: 8 Effacement (%): 90 Station: +1 Exam by:: Susie Nix RN  Labs: Lab Results  Component Value Date   WBC 8.5 05/11/2013   HGB 12.7 05/11/2013   HCT 35.1* 05/11/2013   MCV 87.5 05/11/2013   PLT 146* 05/11/2013    Assessment / Plan: Induction of labor due to postterm,  progressing well on pitocin  Labor: Progressing on Pitocin, will continue to increase Preeclampsia:  n/a Fetal Wellbeing:  Category I Pain Control:  Epidural 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

## 2013-05-13 NOTE — Progress Notes (Signed)
Patient ID: Amanda Perez, female   DOB: 1991-05-08, 22 y.o.   MRN: [id number] Amanda Perez is a 22 y.o. G1P0 at [redacted]w[redacted]d admitted for induction of labor due to Post dates. Due date 05/05/2013.  Subjective: Currently comfortable. Feeling pressure during contractions but no complaints   Objective: BP 119/82  Pulse 99  Temp(Src) 99.2 F (37.3 C) (Oral)  Resp 16  Ht 5\' 3"  (1.6 m)  Wt 80.287 kg (177 lb)  BMI 31.36 kg/m2  SpO2 99%  LMP 08/29/2012 I/O last 3 completed shifts: In: -  Out: 800 [Urine:800]    FHT:  FHR: 160 bpm, variability: moderate,  accelerations:  Present,  decelerations:  Absent UC:   regular, every 3-4 minutes SVE:   Dilation: 8 Effacement (%): 90 Station: +1 Exam by:: RS  Labs: Lab Results  Component Value Date   WBC 8.5 05/11/2013   HGB 12.7 05/11/2013   HCT 35.1* 05/11/2013   MCV 87.5 05/11/2013   PLT 146* 05/11/2013    Assessment / Plan: Induction of labor due to postterm,  progressing well on pitocin  Labor: now unchanged in last 3 hours with increasing fetal HR. IUPC placed without difficulty. Will cont to titrate up pitocin.  Preeclampsia:  n/a Fetal Wellbeing:  Category I but with new onset tachycardia Pain Control:  Epidural I/D:  Acyclovir for HSV-2, gbs neg Anticipated MOD:  NSVD  Norva Bowe L, MD  05/13/2013, 9:37 AM

## 2013-05-14 LAB — CBC
HCT: 24.1 % — ABNORMAL LOW (ref 36.0–46.0)
Platelets: 99 10*3/uL — ABNORMAL LOW (ref 150–400)
RDW: 13.3 % (ref 11.5–15.5)
WBC: 23 10*3/uL — ABNORMAL HIGH (ref 4.0–10.5)

## 2013-05-14 NOTE — Progress Notes (Signed)
Patient ID: Amanda Perez, female   DOB: 1991/05/03, 22 y.o.   MRN: [id number] CNM called RE: pt passing clots. CNM in delivery. IV bolus w/ piticin and IM Methergine given. Bleeding and VS stable. CNM assessed bleeding--scant. FF U/2. Will continue PO methergine series and encourage pt to empty bladder frequently. CBC in am. Push PO fluids.   [place], [place] 05/14/2013 12:34 AM

## 2013-05-14 NOTE — Clinical Social Work Maternal (Signed)
    Clinical Social Work Department PSYCHOSOCIAL ASSESSMENT - MATERNAL/CHILD 05/14/2013  Patient:  Amanda Perez, Amanda Perez  Account Number:  [id number]  Admit Date:  05/11/2013  Marjo Bicker Name:   Letitia Neri    Clinical Social Worker:  Nobie Putnam, LCSW   Date/Time:  05/14/2013 11:29 AM  Date Referred:  05/14/2013   Referral source  CN     Referred reason  [facility]   Other referral source:    I:  FAMILY / HOME ENVIRONMENT Child's legal guardian:  PARENT  Guardian - Name Guardian - Age Guardian - Address  [name] 22 [address] [place], [place] [address]  [name]  [place]   Other household support members/support persons Name Relationship DOB  [place] Warrior MOTHER    STEPFATHER    BROTHER 14 years old   Other support:    II  PSYCHOSOCIAL DATA Information Source:  Patient Interview  Event organiser Employment:   Surveyor, quantity resources:  [employer] If [employer] - County:  [employer] Other  [facility]   School / Grade:   Maternity Care Coordinator / Child Services Coordination / Early Interventions:  Cultural issues impacting care:    III  STRENGTHS Strengths  Adequate Resources  Home prepared for Child (including basic supplies)  Supportive family/friends   Strength comment:    IV  RISK FACTORS AND CURRENT PROBLEMS Current Problem:  YES   Risk Factor & Current Problem Patient Issue Family Issue Risk Factor / Current Problem Comment  Other - See comment [facility]    V  SOCIAL WORK ASSESSMENT CSW met with pt to assess reason for [facility] @ 32 weeks.  Pt told CSW that she started [facility] [employer] in [place], [place] @ 11 weeks.  She moved back to the area in April '14.  She applied for Medicaid but had to wait to receive benefits before she could establish [facility].  She denies any illegal substance use & verbalized understanding of hospital drug testing policy.  UDS is negative, meconium results are pending.  She has all the necessary  supplies for the infant.  Her mother was identified as her primary support person.  FOB is in [place] & not involved at this time.  She denies any history of depression.  Pt appears to be bonding well with the infant & appropriate at this time.  CSW will continue to monitor drug screen results & make a referral if needed.      VI SOCIAL WORK PLAN Social Work Plan  No Further Intervention Required / No Barriers to Discharge   Type of pt/family education:   If child protective services report - county:   If child protective services report - date:   Information/referral to community resources comment:   Other social work plan:

## 2013-05-14 NOTE — Anesthesia Postprocedure Evaluation (Signed)
  Anesthesia Post-op Note  Patient: Amanda Perez  Procedure(s) Performed: * No procedures listed *  Patient Location: Mother/Baby  Anesthesia Type:Epidural  Level of Consciousness: awake  Airway and Oxygen Therapy: Patient Spontanous Breathing  Post-op Pain: mild  Post-op Assessment: Patient's Cardiovascular Status Stable and Respiratory Function Stable  Post-op Vital Signs: stable  Complications: No apparent anesthesia complications

## 2013-05-14 NOTE — Progress Notes (Signed)
Post Partum Day 1  Subjective: no complaints, up ad lib and voiding. Patient is G1P001. She denies any nausea, vomiting, dizziness, SOB, calf pain, headaches, or dizziness. Patient mentions that she has experienced mild vaginal bleeding with similar amounts to her normal period. She denies any discharge.   Objective: Blood pressure 116/54, pulse 76, temperature 98.2 F (36.8 C), temperature source Oral, resp. rate 17, height 5\' 3"  (1.6 m), weight 80.287 kg (177 lb), last menstrual period 08/29/2012, SpO2 98.00%, unknown if currently breastfeeding.  Physical Exam:  General: alert, cooperative and no distress Lochia: appropriate Uterine Fundus: firm Incision: no incision  DVT Evaluation: No evidence of DVT seen on physical exam. Negative Homan's sign. No cords or calf tenderness. Bilateral pedal edema    Recent Labs  05/13/13 1900 05/14/13 0702  HGB 11.9* 9.0*  HCT 32.7* 24.1*    Assessment/Plan: Plan for discharge tomorrow, Breastfeeding, Lactation consult and Contraception Mirena IUD. Patient's hemoglobin (9) and platelet count (pending) will be evaluated and treated accordingly.    LOS: 3 days   Coy Saunas 05/14/2013, 9:38 AM   Pt was seen and examined my me.  No problems identified.  Proceed with routine postpartum care. Korri Ask L. Harraway-Smith, M.D., Evern Core

## 2013-05-15 MED ORDER — OXYCODONE-ACETAMINOPHEN 5-325 MG PO TABS: 1.0000 | ORAL_TABLET | ORAL | Status: DC | PRN

## 2013-05-15 MED ORDER — IBUPROFEN 600 MG PO TABS: 600.0000 mg | ORAL_TABLET | Freq: Four times a day (QID) | ORAL | Status: DC

## 2013-05-15 NOTE — Discharge Summary (Signed)
Obstetric Discharge Summary Reason for Admission: onset of labor Prenatal Procedures: ultrasound Intrapartum Procedures: spontaneous vaginal delivery Postpartum Procedures: none Complications-Operative and Postpartum: none Hemoglobin  Date Value Range Status  05/14/2013 9.0* 12.0 - 15.0 g/dL Final     DELTA CHECK NOTED     REPEATED TO VERIFY     HCT  Date Value Range Status  05/14/2013 24.1* 36.0 - 46.0 % Final    Physical Exam:  General: alert, cooperative and no distress Lochia: appropriate Uterine Fundus: firm Incision: na DVT Evaluation: No evidence of DVT seen on physical exam.  Discharge Diagnoses: Term Pregnancy-delivered and Amnionitis  Discharge Information: Date: 05/15/2013 Activity: pelvic rest Diet: routine Medications: Ibuprofen and Percocet Condition: stable Instructions: refer to practice specific booklet Discharge to: home Follow-up Information   Follow up with Lazaro Arms, MD In 6 weeks.   Specialties:  Obstetrics and Gynecology, Radiology   Contact information:   [address] [place] [address] [phone]       Newborn Data: Live born female  Birth Weight: 7 lb 8.1 oz (3405 g) APGAR: 8, 9  Home with mother.  EURE,LUTHER H 05/15/2013, 7:47 AM

## 2013-06-16 ENCOUNTER — Ambulatory Visit (INDEPENDENT_AMBULATORY_CARE_PROVIDER_SITE_OTHER): Payer: BC Managed Care – PPO | Admitting: Women's Health

## 2013-06-16 ENCOUNTER — Encounter: Payer: Self-pay | Admitting: Women's Health

## 2013-06-16 VITALS — BP 102/70 | Ht 63.5 in | Wt 155.0 lb

## 2013-06-16 DIAGNOSIS — Z3400 Encounter for supervision of normal first pregnancy, unspecified trimester: Secondary | ICD-10-CM

## 2013-06-16 NOTE — Progress Notes (Signed)
Patient ID: Amanda Perez, female   DOB: 04-21-1991, 22 y.o.   MRN: [id number] Subjective:    Amanda Perez is a 22 y.o. G29P1001 African American female who presents for a postpartum visit. She is 5 weeks postpartum following a spontaneous vaginal delivery. I have fully reviewed the prenatal and intrapartum course. The delivery was at 41 gestational weeks. Outcome: spontaneous vaginal delivery. Anesthesia: epidural. Postpartum course has been uncomplicated other than having some constipation. Baby's course has been uncomplicated. Baby is feeding by breast/bottle. Bleeding normal lochia x 4wks, now on menses. Bowel function is constipation. Bladder function is normal. Patient is not sexually active. Contraception method is IUD, mirena. Postpartum depression screening: negative.  The following portions of the patient's history were reviewed and updated as appropriate: allergies, current medications, past medical history, past surgical history and problem list.  Review of Systems Pertinent items are noted in HPI.   Filed Vitals:   06/16/13 1120  BP: 102/70  Height: 5' 3.5" (1.613 m)  Weight: 155 lb (70.308 kg)    Objective:     General:  alert, cooperative and no distress   Breasts:  deferred, no complaints  Lungs: clear to auscultation bilaterally  Heart:  regular rate and rhythm  Abdomen: soft, nontender   Vulva: normal  Vagina: normal vagina  Cervix:  closed  Corpus: Well-involuted  Adnexa:  Non-palpable  Rectal Exam: No hemorrhoids        Assessment:   Normal postpartum exam 5 wks s/p SVD Depression screening Contraception counseling   Plan:   Contraception: desires mirena Follow up asap for mirena insertion, no sex  Reviewed constipation prevention/relief measures, printed info given  Cheral Marker, CNM, WHNP-BC 06/16/2013 11:54 AM

## 2013-06-16 NOTE — Patient Instructions (Addendum)
Constipation  Drink plenty of fluid, preferably water, throughout the day  Eat foods high in fiber such as fruits, vegetables, and grains  Exercise, such as walking, is a good way to keep your bowels regular  Drink warm fluids, especially warm prune juice, or decaf coffee  Eat a 1/2 cup of real oatmeal (not instant), 1/2 cup applesauce, and 1/2-1 cup warm prune juice every day  If needed, you may take Colace (docusate sodium) stool softener once or twice a day to help keep the stool soft. If you are pregnant, wait until you are out of your first trimester (12-14 weeks of pregnancy)  If you still are having problems with constipation, you may take Miralax once daily as needed to help keep your bowels regular.  If you are pregnant, wait until you are out of your first trimester (12-14 weeks of pregnancy)    Levonorgestrel intrauterine device (IUD)- Mirena What is this medicine? LEVONORGESTREL IUD (LEE voe nor jes trel) is a contraceptive (birth control) device. The device is placed inside the uterus by a healthcare professional. It is used to prevent pregnancy and can also be used to treat heavy bleeding that occurs during your period. Depending on the device, it can be used for 3 to 5 years. This medicine may be used for other purposes; ask your health care provider or pharmacist if you have questions. What should I tell my health care provider before I take this medicine? They need to know if you have any of these conditions: -abnormal Pap smear -cancer of the breast, uterus, or cervix -diabetes -endometritis -genital or pelvic infection now or in the past -have more than one sexual partner or your partner has more than one partner -heart disease -history of an ectopic or tubal pregnancy -immune system problems -IUD in place -liver disease or tumor -problems with blood clots or take blood-thinners -use intravenous drugs -uterus of unusual shape -vaginal bleeding that has not been  explained -an unusual or allergic reaction to levonorgestrel, other hormones, silicone, or polyethylene, medicines, foods, dyes, or preservatives -pregnant or trying to get pregnant -breast-feeding How should I use this medicine? This device is placed inside the uterus by a health care professional. Talk to your pediatrician regarding the use of this medicine in children. Special care may be needed. Overdosage: If you think you have taken too much of this medicine contact a poison control center or emergency room at once. NOTE: This medicine is only for you. Do not share this medicine with others. What if I miss a dose? This does not apply. What may interact with this medicine? Do not take this medicine with any of the following medications: -amprenavir -bosentan -fosamprenavir This medicine may also interact with the following medications: -aprepitant -barbiturate medicines for inducing sleep or treating seizures -bexarotene -griseofulvin -medicines to treat seizures like carbamazepine, ethotoin, felbamate, oxcarbazepine, phenytoin, topiramate -modafinil -pioglitazone -rifabutin -rifampin -rifapentine -some medicines to treat HIV infection like atazanavir, indinavir, lopinavir, nelfinavir, tipranavir, ritonavir -St. John's wort -warfarin This list may not describe all possible interactions. Give your health care provider a list of all the medicines, herbs, non-prescription drugs, or dietary supplements you use. Also tell them if you smoke, drink alcohol, or use illegal drugs. Some items may interact with your medicine. What should I watch for while using this medicine? Visit your doctor or health care professional for regular check ups. See your doctor if you or your partner has sexual contact with others, becomes HIV positive, or gets a  sexual transmitted disease. This product does not protect you against HIV infection (AIDS) or other sexually transmitted diseases. You can check  the placement of the IUD yourself by reaching up to the top of your vagina with clean fingers to feel the threads. Do not pull on the threads. It is a good habit to check placement after each menstrual period. Call your doctor right away if you feel more of the IUD than just the threads or if you cannot feel the threads at all. The IUD may come out by itself. You may become pregnant if the device comes out. If you notice that the IUD has come out use a backup birth control method like condoms and call your health care provider. Using tampons will not change the position of the IUD and are okay to use during your period. What side effects may I notice from receiving this medicine? Side effects that you should report to your doctor or health care professional as soon as possible: -allergic reactions like skin rash, itching or hives, swelling of the face, lips, or tongue -fever, flu-like symptoms -genital sores -high blood pressure -no menstrual period for 6 weeks during use -pain, swelling, warmth in the leg -pelvic pain or tenderness -severe or sudden headache -signs of pregnancy -stomach cramping -sudden shortness of breath -trouble with balance, talking, or walking -unusual vaginal bleeding, discharge -yellowing of the eyes or skin Side effects that usually do not require medical attention (report to your doctor or health care professional if they continue or are bothersome): -acne -breast pain -change in sex drive or performance -changes in weight -cramping, dizziness, or faintness while the device is being inserted -headache -irregular menstrual bleeding within first 3 to 6 months of use -nausea This list may not describe all possible side effects. Call your doctor for medical advice about side effects. You may report side effects to FDA at 1-800-FDA-1088. Where should I keep my medicine? This does not apply. NOTE: This sheet is a summary. It may not cover all possible information.  If you have questions about this medicine, talk to your doctor, pharmacist, or health care provider.  2013, [employer]. (09/28/2011 1:54:04 PM)

## 2013-06-19 ENCOUNTER — Ambulatory Visit (INDEPENDENT_AMBULATORY_CARE_PROVIDER_SITE_OTHER): Payer: BC Managed Care – PPO | Admitting: Advanced Practice Midwife

## 2013-06-19 ENCOUNTER — Encounter: Payer: Self-pay | Admitting: Advanced Practice Midwife

## 2013-06-19 VITALS — BP 100/60 | Ht 63.5 in | Wt 154.0 lb

## 2013-06-19 DIAGNOSIS — Z30430 Encounter for insertion of intrauterine contraceptive device: Secondary | ICD-10-CM

## 2013-06-19 DIAGNOSIS — Z3202 Encounter for pregnancy test, result negative: Secondary | ICD-10-CM

## 2013-06-19 LAB — POCT URINE PREGNANCY: Preg Test, Ur: NEGATIVE

## 2013-06-19 NOTE — Progress Notes (Signed)
Amanda Perez is a 22 y.o. year old African American female Gravida 1 Para 1  who presents for placement of a Mirena IUD. Her LMP was 06/15/13 and her pregnancy test today is negative.  The risks and benefits of the method and placement have been thouroughly reviewed with the patient and all questions were answered.  Specifically the patient is aware of failure rate of 09/998, expulsion of the IUD and of possible perforation.  The patient is aware of irregular bleeding due to the method and understands the incidence of irregular bleeding diminishes with time.  Time out was performed.  A Graves speculum was placed.  The cervix was prepped using Betadine. The uterus was found to be neutral and it sounded to 8 cm.  Of possible concern for expulsion is that her cervical os is larger than most (external os ~ 1cm, but internal os does not feel as open).  I have certainly been successful at maintaining IUD's with a multiparous cervix, so I elected to proceed.  The cervix was grasped with a tenaculum and the IUD was inserted to 8 cm.  It was pulled back 1 cm and the IUD was disengaged.  The strings were trimmed to 3 cm.  Sonogram was performed and the proper placement of the IUD was verified.  The patient was instructed on signs and symptoms of infection and to check for the strings after each menses or each month.  The patient is to refrain from intercourse for 3 days.  The patient is scheduled for a return appointment after her first menses or 4 weeks.  CRESENZO-DISHMAN,Zuleyma Scharf 06/19/2013 9:51 AM

## 2013-06-19 NOTE — Patient Instructions (Signed)
Levonorgestrel intrauterine device (IUD) What is this medicine? LEVONORGESTREL IUD (LEE voe nor jes trel) is a contraceptive (birth control) device. The device is placed inside the uterus by a healthcare professional. It is used to prevent pregnancy and can also be used to treat heavy bleeding that occurs during your period. Depending on the device, it can be used for 3 to 5 years. This medicine may be used for other purposes; ask your health care provider or pharmacist if you have questions. What should I tell my health care provider before I take this medicine? They need to know if you have any of these conditions: -abnormal Pap smear -cancer of the breast, uterus, or cervix -diabetes -endometritis -genital or pelvic infection now or in the past -have more than one sexual partner or your partner has more than one partner -heart disease -history of an ectopic or tubal pregnancy -immune system problems -IUD in place -liver disease or tumor -problems with blood clots or take blood-thinners -use intravenous drugs -uterus of unusual shape -vaginal bleeding that has not been explained -an unusual or allergic reaction to levonorgestrel, other hormones, silicone, or polyethylene, medicines, foods, dyes, or preservatives -pregnant or trying to get pregnant -breast-feeding How should I use this medicine? This device is placed inside the uterus by a health care professional. Talk to your pediatrician regarding the use of this medicine in children. Special care may be needed. Overdosage: If you think you have taken too much of this medicine contact a poison control center or emergency room at once. NOTE: This medicine is only for you. Do not share this medicine with others. What if I miss a dose? This does not apply. What may interact with this medicine? Do not take this medicine with any of the following medications: -amprenavir -bosentan -fosamprenavir This medicine may also interact with  the following medications: -aprepitant -barbiturate medicines for inducing sleep or treating seizures -bexarotene -griseofulvin -medicines to treat seizures like carbamazepine, ethotoin, felbamate, oxcarbazepine, phenytoin, topiramate -modafinil -pioglitazone -rifabutin -rifampin -rifapentine -some medicines to treat HIV infection like atazanavir, indinavir, lopinavir, nelfinavir, tipranavir, ritonavir -St. John's wort -warfarin This list may not describe all possible interactions. Give your health care provider a list of all the medicines, herbs, non-prescription drugs, or dietary supplements you use. Also tell them if you smoke, drink alcohol, or use illegal drugs. Some items may interact with your medicine. What should I watch for while using this medicine? Visit your doctor or health care professional for regular check ups. See your doctor if you or your partner has sexual contact with others, becomes HIV positive, or gets a sexual transmitted disease. This product does not protect you against HIV infection (AIDS) or other sexually transmitted diseases. You can check the placement of the IUD yourself by reaching up to the top of your vagina with clean fingers to feel the threads. Do not pull on the threads. It is a good habit to check placement after each menstrual period. Call your doctor right away if you feel more of the IUD than just the threads or if you cannot feel the threads at all. The IUD may come out by itself. You may become pregnant if the device comes out. If you notice that the IUD has come out use a backup birth control method like condoms and call your health care provider. Using tampons will not change the position of the IUD and are okay to use during your period. What side effects may I notice from receiving this medicine?   Side effects that you should report to your doctor or health care professional as soon as possible: -allergic reactions like skin rash, itching or  hives, swelling of the face, lips, or tongue -fever, flu-like symptoms -genital sores -high blood pressure -no menstrual period for 6 weeks during use -pain, swelling, warmth in the leg -pelvic pain or tenderness -severe or sudden headache -signs of pregnancy -stomach cramping -sudden shortness of breath -trouble with balance, talking, or walking -unusual vaginal bleeding, discharge -yellowing of the eyes or skin Side effects that usually do not require medical attention (report to your doctor or health care professional if they continue or are bothersome): -acne -breast pain -change in sex drive or performance -changes in weight -cramping, dizziness, or faintness while the device is being inserted -headache -irregular menstrual bleeding within first 3 to 6 months of use -nausea This list may not describe all possible side effects. Call your doctor for medical advice about side effects. You may report side effects to FDA at 1-800-FDA-1088. Where should I keep my medicine? This does not apply. NOTE: This sheet is a summary. It may not cover all possible information. If you have questions about this medicine, talk to your doctor, pharmacist, or health care provider.  2013, [employer]. (09/28/2011 1:54:04 PM)  

## 2013-07-07 ENCOUNTER — Telehealth: Payer: Self-pay | Admitting: Adult Health

## 2013-07-07 NOTE — Telephone Encounter (Signed)
Pt c/o abnormal bleeding since having Mirena insertion 06/19/2013. Appt made with Cyril Mourning, NP for Wednesday, Oct 29,2014.

## 2013-07-08 ENCOUNTER — Ambulatory Visit: Payer: BC Managed Care – PPO | Admitting: Adult Health

## 2013-07-09 ENCOUNTER — Ambulatory Visit: Payer: BC Managed Care – PPO | Admitting: Adult Health

## 2013-07-10 ENCOUNTER — Encounter: Payer: Self-pay | Admitting: Adult Health

## 2013-07-10 ENCOUNTER — Ambulatory Visit (INDEPENDENT_AMBULATORY_CARE_PROVIDER_SITE_OTHER): Payer: BC Managed Care – PPO | Admitting: Adult Health

## 2013-07-10 VITALS — BP 90/60 | Ht 63.0 in | Wt 159.0 lb

## 2013-07-10 DIAGNOSIS — N938 Other specified abnormal uterine and vaginal bleeding: Secondary | ICD-10-CM

## 2013-07-10 DIAGNOSIS — N949 Unspecified condition associated with female genital organs and menstrual cycle: Secondary | ICD-10-CM | POA: Insufficient documentation

## 2013-07-10 HISTORY — DX: Other specified abnormal uterine and vaginal bleeding: N93.8

## 2013-07-10 MED ORDER — MEGESTROL ACETATE 40 MG PO TABS: ORAL_TABLET | ORAL | Status: DC

## 2013-07-10 NOTE — Progress Notes (Signed)
Subjective:     Patient ID: Amanda Perez, female   DOB: November 08, 1990, 22 y.o.   MRN: [id number]  HPI Amanda Perez is in complaining of bleeding daily since IUD inserted 06/19/13.No Pain.  Review of Systems See HPI   Reviewed past medical,surgical, social and family history. Reviewed medications and allergies.  Objective:   Physical Exam BP 90/60  Ht 5\' 3"  (1.6 m)  Wt 159 lb (72.122 kg)  BMI 28.17 kg/m2  LMP 06/15/2013  Breastfeeding? No   on pelvic IUD strings not visible has period like blood, cervix bulbous and irregular -CMT, no masses or tenderness noted US shows IUD in lower uterine segment, Assessment:     Bleeding with IUD    Plan:     Rx megace 40 mg #45 3 x 5 days then 2 x 5 days then 1 daily with 1 refill   Follow up in 4 weeks

## 2013-07-10 NOTE — Patient Instructions (Signed)
Take megace  follow up in 4 weeks

## 2013-07-17 ENCOUNTER — Other Ambulatory Visit: Payer: Self-pay

## 2013-07-18 ENCOUNTER — Ambulatory Visit: Payer: BC Managed Care – PPO | Admitting: Adult Health

## 2013-08-06 ENCOUNTER — Encounter: Payer: Self-pay | Admitting: Adult Health

## 2013-08-06 ENCOUNTER — Ambulatory Visit (INDEPENDENT_AMBULATORY_CARE_PROVIDER_SITE_OTHER): Payer: BC Managed Care – PPO | Admitting: Adult Health

## 2013-08-06 VITALS — BP 118/60 | Ht 63.0 in | Wt 157.0 lb

## 2013-08-06 DIAGNOSIS — Z8742 Personal history of other diseases of the female genital tract: Secondary | ICD-10-CM

## 2013-08-06 DIAGNOSIS — N949 Unspecified condition associated with female genital organs and menstrual cycle: Secondary | ICD-10-CM

## 2013-08-06 MED ORDER — METOCLOPRAMIDE HCL 10 MG PO TABS: 10.0000 mg | ORAL_TABLET | Freq: Three times a day (TID) | ORAL | Status: DC

## 2013-08-06 NOTE — Patient Instructions (Signed)
Increase pumping and take feenugrek, and Reglan and increase fluids Follow up prn Breastfeeding Deciding to breastfeed is one of the best choices you can make for you and your baby. A change in hormones during pregnancy causes your breast tissue to grow and increases the number and size of your milk ducts. These hormones also allow proteins, sugars, and fats from your blood supply to make breast milk in your milk-producing glands. Hormones prevent breast milk from being released before your baby is born as well as prompt milk flow after birth. Once breastfeeding has begun, thoughts of your baby, as well as his or her sucking or crying, can stimulate the release of milk from your milk-producing glands.  BENEFITS OF BREASTFEEDING For Your Baby  Your first milk (colostrum) helps your baby's digestive system function better.   There are antibodies in your milk that help your baby fight off infections.   Your baby has a lower incidence of asthma, allergies, and sudden infant death syndrome.   The nutrients in breast milk are better for your baby than infant formulas and are designed uniquely for your baby's needs.   Breast milk improves your baby's brain development.   Your baby is less likely to develop other conditions, such as childhood obesity, asthma, or type 2 diabetes mellitus.  For You   Breastfeeding helps to create a very special bond between you and your baby.   Breastfeeding is convenient. Breast milk is always available at the correct temperature and costs nothing.   Breastfeeding helps to burn calories and helps you lose the weight gained during pregnancy.   Breastfeeding makes your uterus contract to its prepregnancy size faster and slows bleeding (lochia) after you give birth.   Breastfeeding helps to lower your risk of developing type 2 diabetes mellitus, osteoporosis, and breast or ovarian cancer later in life. SIGNS THAT YOUR BABY IS HUNGRY Early Signs of  Hunger  Increased alertness or activity.  Stretching.  Movement of the head from side to side.  Movement of the head and opening of the mouth when the corner of the mouth or cheek is stroked (rooting).  Increased sucking sounds, smacking lips, cooing, sighing, or squeaking.  Hand-to-mouth movements.  Increased sucking of fingers or hands. Late Signs of Hunger  Fussing.  Intermittent crying. Extreme Signs of Hunger Signs of extreme hunger will require calming and consoling before your baby will be able to breastfeed successfully. Do not wait for the following signs of extreme hunger to occur before you initiate breastfeeding:   Restlessness.  A loud, strong cry.   Screaming. BREASTFEEDING BASICS Breastfeeding Initiation  Find a comfortable place to sit or lie down, with your neck and back well supported.  Place a pillow or rolled up blanket under your baby to bring him or her to the level of your breast (if you are seated). Nursing pillows are specially designed to help support your arms and your baby while you breastfeed.  Make sure that your baby's abdomen is facing your abdomen.   Gently massage your breast. With your fingertips, massage from your chest wall toward your nipple in a circular motion. This encourages milk flow. You may need to continue this action during the feeding if your milk flows slowly.  Support your breast with 4 fingers underneath and your thumb above your nipple. Make sure your fingers are well away from your nipple and your baby's mouth.   Stroke your baby's lips gently with your finger or nipple.   When  your baby's mouth is open wide enough, quickly bring your baby to your breast, placing your entire nipple and as much of the colored area around your nipple (areola) as possible into your baby's mouth.   More areola should be visible above your baby's upper lip than below the lower lip.   Your baby's tongue should be between his or her  lower gum and your breast.   Ensure that your baby's mouth is correctly positioned around your nipple (latched). Your baby's lips should create a seal on your breast and be turned out (everted).  It is common for your baby to suck about 2 3 minutes in order to start the flow of breast milk. Latching Teaching your baby how to latch on to your breast properly is very important. An improper latch can cause nipple pain and decreased milk supply for you and poor weight gain in your baby. Also, if your baby is not latched onto your nipple properly, he or she may swallow some air during feeding. This can make your baby fussy. Burping your baby when you switch breasts during the feeding can help to get rid of the air. However, teaching your baby to latch on properly is still the best way to prevent fussiness from swallowing air while breastfeeding. Signs that your baby has successfully latched on to your nipple:    Silent tugging or silent sucking, without causing you pain.   Swallowing heard between every 3 4 sucks.    Muscle movement above and in front of his or her ears while sucking.  Signs that your baby has not successfully latched on to nipple:   Sucking sounds or smacking sounds from your baby while breastfeeding.  Nipple pain. If you think your baby has not latched on correctly, slip your finger into the corner of your baby's mouth to break the suction and place it between your baby's gums. Attempt breastfeeding initiation again. Signs of Successful Breastfeeding Signs from your baby:   A gradual decrease in the number of sucks or complete cessation of sucking.   Falling asleep.   Relaxation of his or her body.   Retention of a small amount of milk in his or her mouth.   Letting go of your breast by himself or herself. Signs from you:  Breasts that have increased in firmness, weight, and size 1 3 hours after feeding.   Breasts that are softer immediately after  breastfeeding.  Increased milk volume, as well as a change in milk consistency and color by the 5th day of breastfeeding.   Nipples that are not sore, cracked, or bleeding. Signs That Your Pecola Leisure is Getting Enough Milk  Wetting at least 3 diapers in a 24-hour period. The urine should be clear and pale yellow by age 650 days.  At least 3 stools in a 24-hour period by age 650 days. The stool should be soft and yellow.  At least 3 stools in a 24-hour period by age 63 days. The stool should be seedy and yellow.  No loss of weight greater than 10% of birth weight during the first 54 days of age.  Average weight gain of 4 7 ounces (120 210 mL) per week after age 65 days.  Consistent daily weight gain by age 650 days, without weight loss after the age of 2 weeks. After a feeding, your baby may spit up a small amount. This is common. BREASTFEEDING FREQUENCY AND DURATION Frequent feeding will help you make more milk and can prevent  sore nipples and breast engorgement. Breastfeed when you feel the need to reduce the fullness of your breasts or when your baby shows signs of hunger. This is called "breastfeeding on demand." Avoid introducing a pacifier to your baby while you are working to establish breastfeeding (the first 4 6 weeks after your baby is born). After this time you may choose to use a pacifier. Research has shown that pacifier use during the first year of a baby's life decreases the risk of sudden infant death syndrome (SIDS). Allow your baby to feed on each breast as long as he or she wants. Breastfeed until your baby is finished feeding. When your baby unlatches or falls asleep while feeding from the first breast, offer the second breast. Because newborns are often sleepy in the first few weeks of life, you may need to awaken your baby to get him or her to feed. Breastfeeding times will vary from baby to baby. However, the following rules can serve as a guide to help you ensure that your baby is  properly fed:  Newborns (babies 60 weeks of age or younger) may breastfeed every 1 3 hours.  Newborns should not go longer than 3 hours during the day or 5 hours during the night without breastfeeding.  You should breastfeed your baby a minimum of 8 times in a 24-hour period until you begin to introduce solid foods to your baby at around 81 months of age. BREAST MILK PUMPING Pumping and storing breast milk allows you to ensure that your baby is exclusively fed your breast milk, even at times when you are unable to breastfeed. This is especially important if you are going back to work while you are still breastfeeding or when you are not able to be present during feedings. Your lactation consultant can give you guidelines on how long it is safe to store breast milk.  A breast pump is a machine that allows you to pump milk from your breast into a sterile bottle. The pumped breast milk can then be stored in a refrigerator or freezer. Some breast pumps are operated by hand, while others use electricity. Ask your lactation consultant which type will work best for you. Breast pumps can be purchased, but some hospitals and breastfeeding support groups lease breast pumps on a monthly basis. A lactation consultant can teach you how to hand express breast milk, if you prefer not to use a pump.  CARING FOR YOUR BREASTS WHILE YOU BREASTFEED Nipples can become dry, cracked, and sore while breastfeeding. The following recommendations can help keep your breasts moisturized and healthy:  Avoid using soap on your nipples.   Wear a supportive bra. Although not required, special nursing bras and tank tops are designed to allow access to your breasts for breastfeeding without taking off your entire bra or top. Avoid wearing underwire style bras or extremely tight bras.  Air dry your nipples for 3 after each feeding.   Use only cotton bra pads to absorb leaked breast milk. Leaking of breast milk between  feedings is normal.   Use lanolin on your nipples after breastfeeding. Lanolin helps to maintain your skin's normal moisture barrier. If you use pure lanolin you do not need to wash it off before feeding your baby again. Pure lanolin is not toxic to your baby. You may also hand express a few drops of breast milk and gently massage that milk into your nipples and allow the milk to air dry. In the first few weeks  after giving birth, some women experience extremely full breasts (engorgement). Engorgement can make your breasts feel heavy, warm, and tender to the touch. Engorgement peaks within 3 5 days after you give birth. The following recommendations can help ease engorgement:  Completely empty your breasts while breastfeeding or pumping. You may want to start by applying warm, moist heat (in the shower or with warm water-soaked hand towels) just before feeding or pumping. This increases circulation and helps the milk flow. If your baby does not completely empty your breasts while breastfeeding, pump any extra milk after he or she is finished.  Wear a snug bra (nursing or regular) or tank top for 1 2 days to signal your body to slightly decrease milk production.  Apply ice packs to your breasts, unless this is too uncomfortable for you.  Make sure that your baby is latched on and positioned properly while breastfeeding. If engorgement persists after 48 hours of following these recommendations, contact your health care provider or a Advertising copywriter. OVERALL HEALTH CARE RECOMMENDATIONS WHILE BREASTFEEDING  Eat healthy foods. Alternate between meals and snacks, eating 3 of each per day. Because what you eat affects your breast milk, some of the foods may make your baby more irritable than usual. Avoid eating these foods if you are sure that they are negatively affecting your baby.  Drink milk, fruit juice, and water to satisfy your thirst (about 10 glasses a day).   Rest often, relax, and  continue to take your prenatal vitamins to prevent fatigue, stress, and anemia.  Continue breast self-awareness checks.  Avoid chewing and smoking tobacco.  Avoid alcohol and drug use. Some medicines that may be harmful to your baby can pass through breast milk. It is important to ask your health care provider before taking any medicine, including all over-the-counter and prescription medicine as well as vitamin and herbal supplements. It is possible to become pregnant while breastfeeding. If birth control is desired, ask your health care provider about options that will be safe for your baby. SEEK MEDICAL CARE IF:   You feel like you want to stop breastfeeding or have become frustrated with breastfeeding.  You have painful breasts or nipples.  Your nipples are cracked or bleeding.  Your breasts are red, tender, or warm.  You have a swollen area on either breast.  You have a fever or chills.  You have nausea or vomiting.  You have drainage other than breast milk from your nipples.  Your breasts do not become full before feedings by the 5th day after you give birth.  You feel sad and depressed.  Your baby is too sleepy to eat well.  Your baby is having trouble sleeping.   Your baby is wetting less than 3 diapers in a 24-hour period.  Your baby has less than 3 stools in a 24-hour period.  Your baby's skin or the white part of his or her eyes becomes yellow.   Your baby is not gaining weight by 14 days of age. SEEK IMMEDIATE MEDICAL CARE IF:   Your baby is overly tired (lethargic) and does not want to wake up and feed.  Your baby develops an unexplained fever. Document Released: 08/28/2005 Document Revised: 04/30/2013 Document Reviewed: 02/19/2013 [facility] Patient Information 2014 [place], [place].

## 2013-08-06 NOTE — Progress Notes (Signed)
Subjective:     Patient ID: Amanda Perez, female   DOB: 06-01-1991, 22 y.o.   MRN: [id number]  HPI Lesa is a 22 year old black female who had bleeding with IUD that stopped after taking Megace.She has not breast feed in over a month but wants to try again.  Review of Systems See HPI Reviewed past medical,surgical, social and family history. Reviewed medications and allergies.     Objective:   Physical Exam BP 118/60  Ht 5\' 3"  (1.6 m)  Wt 157 lb (71.215 kg)  BMI 27.82 kg/m2  Breastfeeding? No   talk only see HPI, increase fluids and try pumping often and take feenugrek and reglan  Assessment:    Resolved irregular bleeding with IUD    Plan:     Rx Reglan 10 mg #30 1 tid Try feenugrek and increase fluids Review handout on breastfeeding    return in 2 months for Pap and physical Call any problems or questions

## 2013-08-26 ENCOUNTER — Encounter: Payer: Self-pay | Admitting: Neurology

## 2013-08-26 ENCOUNTER — Ambulatory Visit (INDEPENDENT_AMBULATORY_CARE_PROVIDER_SITE_OTHER): Payer: BC Managed Care – PPO | Admitting: Neurology

## 2013-08-26 VITALS — BP 96/58 | HR 76 | Temp 98.2°F | Ht 64.0 in | Wt 118.0 lb

## 2013-08-26 DIAGNOSIS — R569 Unspecified convulsions: Secondary | ICD-10-CM | POA: Insufficient documentation

## 2013-08-26 MED ORDER — LEVETIRACETAM 500 MG PO TABS: 500.0000 mg | ORAL_TABLET | Freq: Two times a day (BID) | ORAL | Status: DC

## 2013-08-26 NOTE — Progress Notes (Addendum)
NEUROLOGY CONSULTATION NOTE  Margaret Padilla MRN: [id number] DOB: 1990/11/25  Referring provider: Dr. Hassan Rowan Primary care provider: Dr. Hassan Rowan  Reason for consult:  Seizure.  HISTORY OF PRESENT ILLNESS: Margaret Padilla is a 22 year old right-handed woman who presents for seizure.  She is accompanied by her mother.  Records and images were personally reviewed where available.    On 07/05/13, she was smoking a hookah pen with her friend.  She began to feel dizzy and the next thing she remembers was being in the hospital.  Her friend reportedly saw her start jerking irregularly in her seat.  Her head went back and she began shaking and foaming at the mouth.  Earlier that day, she had been under a lot of stress because she lost her campus access card and she didn't eat much that day.  Other details are not available to me.  She was [facility] to [facility]  Unfortunately, I do not have the records from this visit.  She was intubated for airway protection and was extubated the following day.  She was told that she had benzos in her blood, which I assume was due to benzos administered to her to abort the seizures.  She had an MRI of the brain and EEG, both which were reportedly normal.  She was evaluated by a neurologist.  It was presumed that the seizure may have been provoked by stress and maybe the hookah pen.  She was not started on an anticonvulsant.  She has not been driving.  She initially had a headache, which has since resolved.  She has no history of seizures, meningitis or head trauma.  Her mother has history of seizures.  She currently is not taking any birth control.  PAST MEDICAL HISTORY: Past Medical History  Diagnosis Date  . Seizure     PAST SURGICAL HISTORY: No past surgical history on file.  MEDICATIONS: No current outpatient prescriptions on file prior to visit.   No current facility-administered medications on file prior to visit.    ALLERGIES: No  Known Allergies  FAMILY HISTORY: No family history on file.  SOCIAL HISTORY: History   Social History  . Marital Status: Single    Spouse Name: N/A    Number of Children: N/A  . Years of Education: N/A   Occupational History  . Not on file.   Social History Main Topics  . Smoking status: Never Smoker   . Smokeless tobacco: Former Neurosurgeon     Comment: e cigs occasionally  . Alcohol Use: No     Comment: occasionally  . Drug Use: No  . Sexual Activity: Not on file   Other Topics Concern  . Not on file   Social History Narrative  . No narrative on file    REVIEW OF SYSTEMS: Constitutional: No fevers, chills, or sweats, no generalized fatigue, change in appetite Eyes: No visual changes, double vision, eye pain Ear, nose and throat: No hearing loss, ear pain, nasal congestion, sore throat Cardiovascular: No chest pain, palpitations Respiratory:  No shortness of breath at rest or with exertion, wheezes GastrointestinaI: No nausea, vomiting, diarrhea, abdominal pain, fecal incontinence Genitourinary:  No dysuria, urinary retention or frequency Musculoskeletal:  No neck pain, back pain Integumentary: No rash, pruritus, skin lesions Neurological: as above Psychiatric: No depression, insomnia, anxiety Endocrine: No palpitations, fatigue, diaphoresis, mood swings, change in appetite, change in weight, increased thirst Hematologic/Lymphatic:  No anemia, purpura, petechiae. Allergic/Immunologic: no itchy/runny eyes, nasal congestion, recent  allergic reactions, rashes  PHYSICAL EXAM: Filed Vitals:   08/26/13 1442  BP: 96/58  Pulse: 76  Temp: 98.2 F (36.8 C)   General: No acute distress Head:  Normocephalic/atraumatic Neck: supple, no paraspinal tenderness, full range of motion Back: No paraspinal tenderness Heart: regular rate and rhythm Lungs: Clear to auscultation bilaterally. Vascular: No carotid bruits. Neurological Exam: Mental status: alert and oriented to  person, place, and time, speech fluent and not dysarthric, language intact. Cranial nerves: CN I: not tested CN II: pupils equal, round and reactive to light, visual fields intact, fundi unremarkable. CN III, IV, VI:  full range of motion, no nystagmus, no ptosis CN V: facial sensation intact CN VII: upper and lower face symmetric CN VIII: hearing intact CN IX, X: gag intact, uvula midline CN XI: sternocleidomastoid and trapezius muscles intact CN XII: tongue midline Bulk & Tone: normal, no fasciculations. Motor: 5/5 throughout Sensation: pinprick and vibration intact Deep Tendon Reflexes: 2+ throughout, toes down Finger to nose testing: normal Heel to shin: normal Gait: normal.  Able to walk on toes, heels and in tandem. Romberg negative.  IMPRESSION: Isolated seizure.  I cannot say that this was a provoked seizure based on combination of stress and a hookah pen.  Since this is an isolated event, and her workup was reportedly normal, antiepileptic medication does not need to be initiated.  However, she and her mother would feel more comfortable starting a medication.  PLAN: 1.  Keppra 500mg  BID.  Side effects discussed 2.  No driving until re-evaluation at end of April 3.  No swimming alone or standing on high places such as ladders 4.  Follow up at end of April.  Call with questions or concerns.  Thank you for allowing me to take part in the care of this patient.  Shon Millet, DO  CC:  Theodis Shove, MD  ADDENDUM: Received outside hospital records.  It was recorded that she had smoked an "unknown substance" from a pipe, which was thought to have triggered a seizure.  She told me she puffed on a hookah pen, which I don't believe would have provoked a seizure.

## 2013-08-26 NOTE — Patient Instructions (Addendum)
1.  We will start Keppra 500mg  twice daily.  Side effects typically include sleepiness.   The risk of fetal malformation is increased in women taking seizure medications: However, the risk is much lower, when compared to older antiepileptic medications.  The risk is approximately 3% compared to 1-2% in the general population. Woman with epilepsy planning a pregnancy should take 5 mg/day of folic acid in the preconception period and throughout pregnancy.  2.  Avoid activities in which a seizure would cause danger to yourself or to others.  Don't operate dangerous machinery, swim alone, or climb in high or dangerous places, such as on ladders, roofs, or girders.  Do not drive unless your doctor says you may.  3. If you have any warning that you may have a seizure, lay down in a safe place where you can't hurt yourself.    4.  No driving for 6 months from last seizure, as per [facility].   Please refer to the following link on the Epile[employer] of [employer]'s website for more information: http://www.epilepsyfoundation.org/answerplace/Social/driving/drivingu.cfm   5.  Maintain good sleep hygiene.  6.  Notify your neurology if you are planning pregnancy or if you become pregnant.  7.  Contact your doctor if you have any problems that may be related to the medicine you are taking.  8.  Call 911 and bring the patient back to the ED if:        A.  The seizure lasts longer than 5 minutes.       B.  The patient doesn't awaken shortly after the seizure  C.  The patient has new problems such as difficulty seeing, speaking or moving  D.  The patient was injured during the seizure  E.  The patient has a temperature over 102 F (39C)  F.  The patient vomited and now is having trouble breathing         Follow up after January 03, 2014 with Dr. Everlena Cooper.

## 2013-10-07 ENCOUNTER — Other Ambulatory Visit: Payer: BC Managed Care – PPO | Admitting: Adult Health

## 2013-10-16 ENCOUNTER — Other Ambulatory Visit: Payer: BC Managed Care – PPO | Admitting: Obstetrics & Gynecology

## 2013-10-30 ENCOUNTER — Other Ambulatory Visit: Payer: BC Managed Care – PPO | Admitting: Obstetrics & Gynecology

## 2013-11-13 ENCOUNTER — Encounter: Payer: Self-pay | Admitting: Obstetrics & Gynecology

## 2013-11-13 ENCOUNTER — Other Ambulatory Visit (HOSPITAL_COMMUNITY)
Admission: RE | Admit: 2013-11-13 | Discharge: 2013-11-13 | Disposition: A | Discharge status: Home / Self Care | Payer: BC Managed Care – PPO | Source: Ambulatory Visit | Attending: Obstetrics & Gynecology | Admitting: Obstetrics & Gynecology

## 2013-11-13 ENCOUNTER — Ambulatory Visit (INDEPENDENT_AMBULATORY_CARE_PROVIDER_SITE_OTHER): Payer: BC Managed Care – PPO | Admitting: Obstetrics & Gynecology

## 2013-11-13 VITALS — BP 110/70 | Ht 63.5 in | Wt 163.0 lb

## 2013-11-13 DIAGNOSIS — Z01419 Encounter for gynecological examination (general) (routine) without abnormal findings: Secondary | ICD-10-CM | POA: Insufficient documentation

## 2013-11-13 DIAGNOSIS — Z3202 Encounter for pregnancy test, result negative: Secondary | ICD-10-CM

## 2013-11-13 LAB — POCT URINE PREGNANCY: Preg Test, Ur: NEGATIVE

## 2013-11-13 NOTE — Progress Notes (Signed)
Patient ID: Amanda Perez Righetti, female   DOB: 06/17/1991, 23 y.o.   MRN: [id number] Subjective:     Amanda Perez Forsberg is a 23 y.o. female here for a routine exam.  No LMP recorded. Patient is not currently having periods (Reason: IUD). G1P1001 Birth Control Method:  Mirena IUD  Menstrual Calendar(currently): irregular  Current complaints: none.   Current acute medical issues:  none   Recent Gynecologic History No LMP recorded. Patient is not currently having periods (Reason: IUD). Last Pap: na,   Last mammogram: na,    Past Medical History  Diagnosis Date  . Medical history non-contributory   . Other disorder of menstruation and other abnormal bleeding from female genital tract 07/10/2013    Bleeding daily since IUD inserted    Past Surgical History  Procedure Laterality Date  . No past surgeries      OB History   Grav Para Term Preterm Abortions TAB SAB Ect Mult Living   1 1 1       1       History   Social History  . Marital Status: Single    Spouse Name: N/A    Number of Children: N/A  . Years of Education: N/A   Social History Main Topics  . Smoking status: Never Smoker   . Smokeless tobacco: Never Used  . Alcohol Use: No  . Drug Use: No  . Sexual Activity: Not Currently    Birth Control/ Protection: None, IUD   Other Topics Concern  . None   Social History Narrative  . None    Family History  Problem Relation Age of Onset  . Hypertension Mother   . Seizures Mother      Review of Systems  Review of Systems  Constitutional: Negative for fever, chills, weight loss, malaise/fatigue and diaphoresis.  HENT: Negative for hearing loss, ear pain, nosebleeds, congestion, sore throat, neck pain, tinnitus and ear discharge.   Eyes: Negative for blurred vision, double vision, photophobia, pain, discharge and redness.  Respiratory: Negative for cough, hemoptysis, sputum production, shortness of breath, wheezing and stridor.   Cardiovascular: Negative for chest pain,  palpitations, orthopnea, claudication, leg swelling and PND.  Gastrointestinal: negative for abdominal pain. Negative for heartburn, nausea, vomiting, diarrhea, constipation, blood in stool and melena.  Genitourinary: Negative for dysuria, urgency, frequency, hematuria and flank pain.  Musculoskeletal: Negative for myalgias, back pain, joint pain and falls.  Skin: Negative for itching and rash.  Neurological: Negative for dizziness, tingling, tremors, sensory change, speech change, focal weakness, seizures, loss of consciousness, weakness and headaches.  Endo/Heme/Allergies: Negative for environmental allergies and polydipsia. Does not bruise/bleed easily.  Psychiatric/Behavioral: Negative for depression, suicidal ideas, hallucinations, memory loss and substance abuse. The patient is not nervous/anxious and does not have insomnia.        Objective:    Physical Exam  Vitals reviewed. Constitutional: She is oriented to person, place, and time. She appears well-developed and well-nourished.  HENT:  Head: Normocephalic and atraumatic.        Right Ear: External ear normal.  Left Ear: External ear normal.  Nose: Nose normal.  Mouth/Throat: Oropharynx is clear and moist.  Eyes: Conjunctivae and EOM are normal. Pupils are equal, round, and reactive to light. Right eye exhibits no discharge. Left eye exhibits no discharge. No scleral icterus.  Neck: Normal range of motion. Neck supple. No tracheal deviation present. No thyromegaly present.  Cardiovascular: Normal rate, regular rhythm, normal heart sounds and intact distal pulses.  Exam reveals no  gallop and no friction rub.   No murmur heard. Respiratory: Effort normal and breath sounds normal. No respiratory distress. She has no wheezes. She has no rales. She exhibits no tenderness.  GI: Soft. Bowel sounds are normal. She exhibits no distension and no mass. There is no tenderness. There is no rebound and no guarding.  Genitourinary:  Breasts no  masses skin changes or nipple changes bilaterally      Vulva is normal without lesions Vagina is pink moist without discharge Cervix normal in appearance and pap is done Uterus is normal size shape and contour Adnexa is negative with normal sized ovaries    Musculoskeletal: Normal range of motion. She exhibits no edema and no tenderness.  Neurological: She is alert and oriented to person, place, and time. She has normal reflexes. She displays normal reflexes. No cranial nerve deficit. She exhibits normal muscle tone. Coordination normal.  Skin: Skin is warm and dry. No rash noted. No erythema. No pallor.  Psychiatric: She has a normal mood and affect. Her behavior is normal. Judgment and thought content normal.       Assessment:    Healthy female exam.    Plan:    Contraception: IUD. Follow up in: 1 year.

## 2014-01-05 ENCOUNTER — Ambulatory Visit: Payer: BC Managed Care – PPO | Admitting: Neurology

## 2014-01-30 ENCOUNTER — Ambulatory Visit: Payer: BC Managed Care – PPO | Admitting: Neurology

## 2014-02-03 ENCOUNTER — Telehealth: Payer: Self-pay | Admitting: Neurology

## 2014-02-03 NOTE — Telephone Encounter (Signed)
Pt no showed follow up appt w/ Dr. Jaffe 01/30/14. No show letter mailed to pt / Sherri S.  °

## 2014-05-17 ENCOUNTER — Other Ambulatory Visit: Payer: Self-pay | Admitting: Adult Health

## 2014-06-17 ENCOUNTER — Encounter: Payer: Self-pay | Admitting: Advanced Practice Midwife

## 2014-06-17 ENCOUNTER — Ambulatory Visit (INDEPENDENT_AMBULATORY_CARE_PROVIDER_SITE_OTHER): Payer: BC Managed Care – PPO | Admitting: Advanced Practice Midwife

## 2014-06-17 VITALS — BP 100/70 | Wt 167.0 lb

## 2014-06-17 DIAGNOSIS — Z30432 Encounter for removal of intrauterine contraceptive device: Secondary | ICD-10-CM

## 2014-06-17 MED ORDER — NORGESTIMATE-ETH ESTRADIOL 0.25-35 MG-MCG PO TABS: 1.0000 | ORAL_TABLET | Freq: Every day | ORAL | Status: DC

## 2014-06-17 NOTE — Progress Notes (Signed)
  Sharion SettlerBriana Vanduzer 23 y.o.  There were no vitals filed for this visit. Past Medical History  Diagnosis Date  . Medical history non-contributory   . Other disorder of menstruation and other abnormal bleeding from female genital tract 07/10/2013    Bleeding daily since IUD inserted   Past Surgical History  Procedure Laterality Date  . No past surgeries     family history includes Hypertension in her mother; Seizures in her mother. Current outpatient prescriptions:desonide (DESOWEN) 0.05 % cream, Apply 1 application topically daily as needed (For excema.)., Disp: , Rfl: ;  levonorgestrel (MIRENA) 20 MCG/24HR IUD, 1 each by Intrauterine route once., Disp: , Rfl:     Here for IUD removal.  She had the Mirena IUD placed 10/14 and would like it removed due to bleeding issues. Her plans for future contraception are COC's.  A graves speculum was placed, and the strings were not visible.  The IUD was grasped with a curved Tresa EndoKelly and the IUD easily removed.  Pt given IUD removal f/u instructions.

## 2014-06-24 ENCOUNTER — Encounter: Payer: Self-pay | Admitting: Advanced Practice Midwife

## 2014-06-24 ENCOUNTER — Ambulatory Visit (INDEPENDENT_AMBULATORY_CARE_PROVIDER_SITE_OTHER): Payer: BC Managed Care – PPO | Admitting: Advanced Practice Midwife

## 2014-06-24 ENCOUNTER — Other Ambulatory Visit: Payer: Self-pay | Admitting: Advanced Practice Midwife

## 2014-06-24 VITALS — BP 100/70 | Wt 166.0 lb

## 2014-06-24 DIAGNOSIS — B009 Herpesviral infection, unspecified: Secondary | ICD-10-CM

## 2014-06-24 DIAGNOSIS — Z113 Encounter for screening for infections with a predominantly sexual mode of transmission: Secondary | ICD-10-CM

## 2014-06-24 MED ORDER — VALACYCLOVIR HCL 1 G PO TABS
1000.0000 mg | ORAL_TABLET | Freq: Every day | ORAL | Status: DC
Start: 2014-06-24 — End: 2014-12-29

## 2014-06-24 NOTE — Progress Notes (Signed)
[facility] Visit  Patient name: Amanda Perez Aliberti MRN [id number]  Date of birth: 06/05/1991  CC & HPI:  Amanda Perez Pinegar is a 23 y.o. African American female presenting today for STD screening.  Does not have a boyfriend.  On COC's.  Uses condoms.   Pertinent History Reviewed:  Medical & Surgical Hx:   Past Medical History  Diagnosis Date  . Other disorder of menstruation and other abnormal bleeding from female genital tract 07/10/2013    Bleeding daily since IUD inserted  . HSV-2 (herpes simplex virus 2) infection    Past Surgical History  Procedure Laterality Date  . No past surgeries     Medications: Reviewed & Updated - see associated section Social History: Reviewed -  reports that she has never smoked. She has never used smokeless tobacco.  Objective Findings:  Vitals: BP 100/70  Wt 166 lb (75.297 kg)  LMP 06/17/2014  Physical Examination: General appearance - alert, well appearing, and in no distress Mental status - alert, oriented to person, place, and time Musculoskeletal - no muscular tenderness noted Extremities - no pedal edema noted Skin - normal coloration and no rashes, no suspicious skin lesions noted  No results found for this or any previous visit (from the past 24 hour(s)).   Assessment & Plan:  A:   STD Screening P:  HIV, RPR, Hep C, Trich, GC/CHL  Refilled Valtrex 500mg  qd for transmission prevention   CRESENZO-DISHMAN,Keahi Mccarney CNM 06/24/2014 2:24 PM

## 2014-06-25 LAB — TRICHOMONAS VAGINALIS, PROBE AMP: TRICHOMONAS VAGINALIS PROBE APTIMA: NEGATIVE

## 2014-06-25 LAB — GC/CHLAMYDIA PROBE AMP
CT Probe RNA: NEGATIVE
GC PROBE AMP APTIMA: NEGATIVE

## 2014-06-25 LAB — HIV ANTIBODY (ROUTINE TESTING W REFLEX): HIV 1&2 Ab, 4th Generation: NONREACTIVE

## 2014-06-25 LAB — HEPATITIS C ANTIBODY: HCV AB: NEGATIVE

## 2014-06-25 LAB — RPR

## 2014-06-30 ENCOUNTER — Telehealth: Payer: Self-pay | Admitting: Advanced Practice Midwife

## 2014-06-30 NOTE — Telephone Encounter (Signed)
Pt informed all lab test were negative.

## 2014-07-13 ENCOUNTER — Encounter: Payer: Self-pay | Admitting: Advanced Practice Midwife

## 2014-09-07 ENCOUNTER — Encounter (HOSPITAL_COMMUNITY): Payer: Self-pay | Admitting: Emergency Medicine

## 2014-09-07 ENCOUNTER — Emergency Department (HOSPITAL_COMMUNITY)
Admission: EM | Admit: 2014-09-07 | Discharge: 2014-09-07 | Disposition: A | Discharge status: Home / Self Care | Payer: Medicaid Other | Attending: Emergency Medicine | Admitting: Emergency Medicine

## 2014-09-07 DIAGNOSIS — Z8619 Personal history of other infectious and parasitic diseases: Secondary | ICD-10-CM | POA: Insufficient documentation

## 2014-09-07 DIAGNOSIS — Z7952 Long term (current) use of systemic steroids: Secondary | ICD-10-CM | POA: Insufficient documentation

## 2014-09-07 DIAGNOSIS — Z3202 Encounter for pregnancy test, result negative: Secondary | ICD-10-CM | POA: Insufficient documentation

## 2014-09-07 DIAGNOSIS — Z79899 Other long term (current) drug therapy: Secondary | ICD-10-CM | POA: Diagnosis not present

## 2014-09-07 DIAGNOSIS — N760 Acute vaginitis: Secondary | ICD-10-CM | POA: Insufficient documentation

## 2014-09-07 DIAGNOSIS — R102 Pelvic and perineal pain: Secondary | ICD-10-CM | POA: Diagnosis present

## 2014-09-07 DIAGNOSIS — B9689 Other specified bacterial agents as the cause of diseases classified elsewhere: Secondary | ICD-10-CM

## 2014-09-07 LAB — WET PREP, GENITAL
Trich, Wet Prep: NONE SEEN
Yeast Wet Prep HPF POC: NONE SEEN

## 2014-09-07 LAB — PREGNANCY, URINE: Preg Test, Ur: NEGATIVE

## 2014-09-07 LAB — URINALYSIS, ROUTINE W REFLEX MICROSCOPIC
Bilirubin Urine: NEGATIVE
Glucose, UA: NEGATIVE mg/dL
Hgb urine dipstick: NEGATIVE
KETONES UR: NEGATIVE mg/dL
NITRITE: NEGATIVE
PROTEIN: NEGATIVE mg/dL
Specific Gravity, Urine: 1.01 (ref 1.005–1.030)
UROBILINOGEN UA: 0.2 mg/dL (ref 0.0–1.0)
pH: 5.5 (ref 5.0–8.0)

## 2014-09-07 LAB — URINE MICROSCOPIC-ADD ON

## 2014-09-07 MED ORDER — METRONIDAZOLE 500 MG PO TABS: 500.0000 mg | ORAL_TABLET | Freq: Two times a day (BID) | ORAL | Status: DC

## 2014-09-07 NOTE — Discharge Instructions (Signed)
Bacterial Vaginosis Bacterial vaginosis is a vaginal infection that occurs when the normal balance of bacteria in the vagina is disrupted. It results from an overgrowth of certain bacteria. This is the most common vaginal infection in women of childbearing age. Treatment is important to prevent complications, especially in pregnant women, as it can cause a premature delivery. CAUSES  Bacterial vaginosis is caused by an increase in harmful bacteria that are normally present in smaller amounts in the vagina. Several different kinds of bacteria can cause bacterial vaginosis. However, the reason that the condition develops is not fully understood. RISK FACTORS Certain activities or behaviors can put you at an increased risk of developing bacterial vaginosis, including:  Having a new sex partner or multiple sex partners.  Douching.  Using an intrauterine device (IUD) for contraception. Women do not get bacterial vaginosis from toilet seats, bedding, swimming pools, or contact with objects around them. SIGNS AND SYMPTOMS  Some women with bacterial vaginosis have no signs or symptoms. Common symptoms include:  Grey vaginal discharge.  A fishlike odor with discharge, especially after sexual intercourse.  Itching or burning of the vagina and vulva.  Burning or pain with urination. DIAGNOSIS  Your health care provider will take a medical history and examine the vagina for signs of bacterial vaginosis. A sample of vaginal fluid may be taken. Your health care provider will look at this sample under a microscope to check for bacteria and abnormal cells. A vaginal pH test may also be done.  TREATMENT  Bacterial vaginosis may be treated with antibiotic medicines. These may be given in the form of a pill or a vaginal cream. A second round of antibiotics may be prescribed if the condition comes back after treatment.  HOME CARE INSTRUCTIONS   Only take over-the-counter or prescription medicines as  directed by your health care provider.  If antibiotic medicine was prescribed, take it as directed. Make sure you finish it even if you start to feel better.  Do not have sex until treatment is completed.  Tell all sexual partners that you have a vaginal infection. They should see their health care provider and be treated if they have problems, such as a mild rash or itching.  Practice safe sex by using condoms and only having one sex partner. SEEK MEDICAL CARE IF:   Your symptoms are not improving after 3 days of treatment.  You have increased discharge or pain.  You have a fever. MAKE SURE YOU:   Understand these instructions.  Will watch your condition.  Will get help right away if you are not doing well or get worse. FOR MORE INFORMATION  Centers for Disease Control and Prevention, Division of STD Prevention: www.cdc.gov/std American Sexual Health Association (ASHA): www.ashastd.org  Document Released: 08/28/2005 Document Revised: 06/18/2013 Document Reviewed: 04/09/2013 Exit[employer] Patient Information 2015 [employer]. This information is not intended to replace advice given to you by your health care provider. Make sure you discuss any questions you have with your health care provider.  

## 2014-09-07 NOTE — ED Notes (Addendum)
Pt reports sore throat for several days and reports dysuria, "itching" sensation with urination. Pt reports history of same and reports "feels like another urinary tract infection." pt denies any n/v/fever,abdominal pain.

## 2014-09-07 NOTE — ED Provider Notes (Signed)
This chart was scribed for Layla MawKristen N Ward, DO by Arlan OrganAshley Leger, ED Scribe. This patient was seen in room APA08/APA08 and the patient's care was started 8:34 AM.   TIME SEEN: 7:22 AM   CHIEF COMPLAINT:  Chief Complaint  Patient presents with  . Sore Throat     HPI:  HPI Comments: Amanda Perez is a 23 y.o. female with history of HSV-2 who presents to the Emergency Department complaining of possible UTI x 2 days. She reports vaginal pain, urinary urgency, urinary frequency, and white vaginal discharge. She admits to being sexually active this past week. No history of STI's. Amanda Perez also mentions constant, mild sore throat x 2 days. She has had nasal drainage. She has not tried any OTC medications to help manage symptoms. No dysuria, hematuria, fever, abdominal pain, nausea, vomiting, or diarrhea. No known sick contacts or recent travel. No known allergies to medications.  ROS: See HPI Constitutional: no fever  Eyes: no drainage  ENT:  runny nose, positive for sore throat Cardiovascular:  no chest pain  Resp: no SOB  GI: no vomiting, nausea, diarrhea, GU: no dysuria. Positive for urinary urgency, urinary frequency, and vaginal discharge Integumentary: no rash  Allergy: no hives  Musculoskeletal: no leg swelling  Neurological: no slurred speech ROS otherwise negative  PAST MEDICAL HISTORY/PAST SURGICAL HISTORY:  Past Medical History  Diagnosis Date  . Other disorder of menstruation and other abnormal bleeding from female genital tract 07/10/2013    Bleeding daily since IUD inserted  . HSV-2 (herpes simplex virus 2) infection     MEDICATIONS:  Prior to Admission medications   Medication Sig Start Date End Date Taking? Authorizing Provider  desonide (DESOWEN) 0.05 % cream Apply 1 application topically daily as needed (For excema.).    Historical Provider, MD  levonorgestrel (MIRENA) 20 MCG/24HR IUD 1 each by Intrauterine route once.    Historical Provider, MD   norgestimate-ethinyl estradiol (ORTHO-CYCLEN,SPRINTEC,PREVIFEM) 0.25-35 MG-MCG tablet Take 1 tablet by mouth daily. 06/17/14   Jacklyn ShellFrances Cresenzo-Dishmon, CNM  valACYclovir (VALTREX) 1000 MG tablet Take 1 tablet (1,000 mg total) by mouth daily. 06/24/14   Jacklyn ShellFrances Cresenzo-Dishmon, CNM    ALLERGIES:  No Known Allergies  SOCIAL HISTORY:  History  Substance Use Topics  . Smoking status: Never Smoker   . Smokeless tobacco: Never Used  . Alcohol Use: No    FAMILY HISTORY: Family History  Problem Relation Age of Onset  . Hypertension Mother   . Seizures Mother     EXAM: There were no vitals taken for this visit. CONSTITUTIONAL: Alert and oriented and responds appropriately to questions. Well-appearing; well-nourished HEAD: Normocephalic EYES: Conjunctivae clear, PERRL ENT: normal nose; no rhinorrhea; moist mucous membranes; pharynx without lesions noted; minimal pharygeal erythema with clear sinus drainage in posterior pharynx, no exudate, uvular deviation, no trismus, no drooling, no muffled voice NECK: Supple, no meningismus, no LAD  CARD: RRR; S1 and S2 appreciated; no murmurs, no clicks, no rubs, no gallops RESP: Normal chest excursion without splinting or tachypnea; breath sounds clear and equal bilaterally; no wheezes, no rhonchi, no rales,  ABD/GI: Normal bowel sounds; non-distended; soft, non-tender, no rebound, no guarding GU:  Patient does have some full for swelling and tenderness with no external lesions, she has a moderate amount of yellow foul-smelling discharge, no adnexal tenderness or fullness, no cervical motion tenderness BACK:  The back appears normal and is non-tender to palpation, there is no CVA tenderness EXT: Normal ROM in all joints; non-tender to palpation; no  edema; normal capillary refill; no cyanosis    SKIN: Normal color for age and race; warm NEURO: Moves all extremities equally PSYCH: The patient's mood and manner are appropriate. Grooming and personal  hygiene are appropriate.  MEDICAL DECISION MAKING: Patient here with likely viral upper respiratory infection. No tonsillar hypertrophy or exudate or uvular deviation exam. She does have a small amount of clear sinus drainage in the posterior oropharynx. I do not feel she needs a strep swab at this time her antibiotics. She is also complaining of vaginal pain appears to have vaginitis, vulvar swelling on exam. Wet prep is positive for clue cells. Given she does have foul-smelling, copious discharge will treat with Flagyl. Gonorrhea and Chlamydia pending. Discussed return precautions. Discussed supportive care instructions. She verbalized understanding and is comfortable with plan.   I personally performed the services described in this documentation, which was scribed in my presence. The recorded information has been reviewed and is accurate.    Layla MawKristen N Ward, DO 09/07/14 1553

## 2014-09-08 LAB — GC/CHLAMYDIA PROBE AMP
CT Probe RNA: NEGATIVE
GC Probe RNA: NEGATIVE

## 2014-09-09 LAB — URINE CULTURE: Colony Count: 30000

## 2014-09-11 ENCOUNTER — Telehealth: Payer: Self-pay | Admitting: *Deleted

## 2014-09-11 NOTE — ED Notes (Signed)
(+)  urine culture, treated with Metronidazole, no further treatment, Felicita Gage, PA

## 2014-09-11 NOTE — L&D Delivery Note (Cosign Needed)
Delivery Note Patient presented to our MAU in active labor. Ampicillin was started right away. Patient progressed quickly and delivered approximately 90 minutes after presentation w/o augmentation. SROM clear shortly prior to delivery.  At 7:30 AM a viable female was delivered via Vaginal, Spontaneous Delivery (Presentation: Right Occiput Anterior).  APGAR: 9, 9; weight 3725 g.   Placenta status: normal.  Cord: 3 vessels with the following complications: None.  Cord pH: not obtained  Anesthesia: None  Episiotomy: None Lacerations: None Estimated Blood Loss (mL):  150 ml  Mom to postpartum.  Baby to Couplet care / Skin to Skin.  Amanda Perez 08/15/2015, 7:55 AM

## 2014-09-18 ENCOUNTER — Encounter (HOSPITAL_COMMUNITY): Payer: Self-pay | Admitting: Emergency Medicine

## 2014-09-18 ENCOUNTER — Emergency Department (HOSPITAL_COMMUNITY)
Admission: EM | Admit: 2014-09-18 | Discharge: 2014-09-18 | Disposition: A | Discharge status: Home / Self Care | Payer: Medicaid Other | Attending: Emergency Medicine | Admitting: Emergency Medicine

## 2014-09-18 DIAGNOSIS — N760 Acute vaginitis: Secondary | ICD-10-CM | POA: Insufficient documentation

## 2014-09-18 DIAGNOSIS — L292 Pruritus vulvae: Secondary | ICD-10-CM | POA: Diagnosis present

## 2014-09-18 DIAGNOSIS — Z8619 Personal history of other infectious and parasitic diseases: Secondary | ICD-10-CM | POA: Insufficient documentation

## 2014-09-18 DIAGNOSIS — Z79899 Other long term (current) drug therapy: Secondary | ICD-10-CM | POA: Diagnosis not present

## 2014-09-18 DIAGNOSIS — B9689 Other specified bacterial agents as the cause of diseases classified elsewhere: Secondary | ICD-10-CM

## 2014-09-18 DIAGNOSIS — B3731 Acute candidiasis of vulva and vagina: Secondary | ICD-10-CM

## 2014-09-18 DIAGNOSIS — B373 Candidiasis of vulva and vagina: Secondary | ICD-10-CM

## 2014-09-18 LAB — WET PREP, GENITAL: Trich, Wet Prep: NONE SEEN

## 2014-09-18 MED ORDER — TERCONAZOLE 80 MG VA SUPP: 80.0000 mg | Freq: Every day | VAGINAL | Status: DC

## 2014-09-18 MED ORDER — FLUCONAZOLE 150 MG PO TABS: 150.0000 mg | ORAL_TABLET | Freq: Every day | ORAL | Status: AC

## 2014-09-18 NOTE — ED Provider Notes (Signed)
CSN: [id number]     Arrival date & time 09/18/14  1613 History   First MD Initiated Contact with Patient 09/18/14 1627     Chief Complaint  Patient presents with  . Vaginal Itching     (Consider location/radiation/quality/duration/timing/severity/associated sxs/prior Treatment) The history is provided by the patient.   Amanda SettlerBriana Newmark is a 24 y.o. female presenting with continued vaginal irritation and discomfort despite completing a 7 day course of flagyl 4 days ago which she was given here after being diagnosed with bacterial vaginosis.  She denies fevers, chills, nausea, vomiting, abdominal pain, dysuria and hematuria.  Her last intercourse was 3 weeks ago, prior to her last visit here and was with her long term partner.  Her period started 5 days ago, about the time her symptoms worsened.    Past Medical History  Diagnosis Date  . Other disorder of menstruation and other abnormal bleeding from female genital tract 07/10/2013    Bleeding daily since IUD inserted  . HSV-2 (herpes simplex virus 2) infection    Past Surgical History  Procedure Laterality Date  . No past surgeries     Family History  Problem Relation Age of Onset  . Hypertension Mother   . Seizures Mother    History  Substance Use Topics  . Smoking status: Never Smoker   . Smokeless tobacco: Never Used  . Alcohol Use: No   OB History    Gravida Para Term Preterm AB TAB SAB Ectopic Multiple Living   1 1 1       1      Review of Systems  Constitutional: Negative for fever and chills.  HENT: Negative for congestion and sore throat.   Eyes: Negative.   Respiratory: Negative for chest tightness and shortness of breath.   Cardiovascular: Negative for chest pain.  Gastrointestinal: Negative for nausea, vomiting and abdominal pain.  Genitourinary: Positive for vaginal discharge. Negative for dysuria and vaginal pain.  Musculoskeletal: Negative for joint swelling, arthralgias and neck pain.  Skin: Negative.   Negative for rash and wound.  Neurological: Negative for dizziness, weakness, light-headedness, numbness and headaches.  Psychiatric/Behavioral: Negative.       Allergies  Review of patient's allergies indicates no known allergies.  Home Medications   Prior to Admission medications   Medication Sig Start Date End Date Taking? Authorizing Provider  desonide (DESOWEN) 0.05 % cream Apply 1 application topically daily as needed (For excema.).    Historical Provider, MD  fluconazole (DIFLUCAN) 150 MG tablet Take 1 tablet (150 mg total) by mouth daily. 09/18/14 09/25/14  Burgess AmorJulie Tamas Suen, PA-C  levonorgestrel (MIRENA) 20 MCG/24HR IUD 1 each by Intrauterine route once.    Historical Provider, MD  metroNIDAZOLE (FLAGYL) 500 MG tablet Take 1 tablet (500 mg total) by mouth 2 (two) times daily. 09/07/14   Kristen N Ward, DO  norgestimate-ethinyl estradiol (ORTHO-CYCLEN,SPRINTEC,PREVIFEM) 0.25-35 MG-MCG tablet Take 1 tablet by mouth daily. 06/17/14   Jacklyn ShellFrances Cresenzo-Dishmon, CNM  terconazole (TERAZOL 3) 80 MG vaginal suppository Place 1 suppository (80 mg total) vaginally at bedtime. 09/18/14   Burgess AmorJulie Jebadiah Imperato, PA-C  valACYclovir (VALTREX) 1000 MG tablet Take 1 tablet (1,000 mg total) by mouth daily. 06/24/14   Jacklyn ShellFrances Cresenzo-Dishmon, CNM   BP 115/77 mmHg  Pulse 75  Temp(Src) 98.4 F (36.9 C) (Oral)  Resp 16  Ht 5' 3.5" (1.613 m)  Wt 160 lb (72.576 kg)  BMI 27.89 kg/m2  SpO2 100%  LMP 09/13/2014  Breastfeeding? No Physical Exam  Constitutional: She appears well-developed  and well-nourished.  HENT:  Head: Normocephalic and atraumatic.  Eyes: Conjunctivae are normal.  Neck: Normal range of motion.  Cardiovascular: Normal rate, regular rhythm, normal heart sounds and intact distal pulses.   Pulmonary/Chest: Effort normal and breath sounds normal. She has no wheezes.  Abdominal: Soft. Bowel sounds are normal. There is no tenderness. There is no guarding.  Genitourinary: Uterus is not tender. Cervix  exhibits no motion tenderness and no discharge. Right adnexum displays no tenderness. Left adnexum displays no tenderness. There is erythema in the vagina. No tenderness in the vagina. Vaginal discharge found.  Copious thick vaginal discharge, clumpy.  Vaginal walls and introitus appear very erythematous .  Musculoskeletal: Normal range of motion.  Neurological: She is alert.  Skin: Skin is warm and dry.  Psychiatric: She has a normal mood and affect.  Nursing note and vitals reviewed.   ED Course  Procedures (including critical care time) Labs Review Labs Reviewed  WET PREP, GENITAL - Abnormal; Notable for the following:    Yeast Wet Prep HPF POC FEW (*)    Clue Cells Wet Prep HPF POC FEW (*)    WBC, Wet Prep HPF POC MODERATE (*)    All other components within normal limits  GC/CHLAMYDIA PROBE AMP    Imaging Review No results found.   EKG Interpretation None      MDM   Final diagnoses:  Yeast vaginitis  Bacterial vaginosis    Patients labs and/or radiological studies were viewed and considered during the medical decision making and disposition process. Gc/chlamydia cx pending, they were negative last week.  Pt prescribed diflucan and terazol 3.  Advised f/u with [facility] (this is her gyn home)  if sx persist.    Burgess Amor, PA-C 09/18/14 1748  Raeford Razor, MD 09/18/14 [phone]

## 2014-09-18 NOTE — ED Notes (Signed)
Finished antibx for BV last Sunday.  Menses began same day.  Began having itching again Tuesday night after using super tampon.

## 2014-09-18 NOTE — ED Notes (Signed)
Pt reports was seen last week for same. Pt reports took px and was better but reports "my cycle came on and now i am feeling the same way i was before." pt reports vaginal itching but denies any urinary symptoms,n/v/d,abdominal pain.

## 2014-09-18 NOTE — ED Notes (Signed)
Patient with no complaints at this time. Respirations even and unlabored. Skin warm/dry. Discharge instructions reviewed with patient at this time. Patient given opportunity to voice concerns/ask questions. Patient discharged at this time and left Emergency Department with steady gait.   

## 2014-09-18 NOTE — Discharge Instructions (Signed)
Bacterial Vaginosis °Bacterial vaginosis is a vaginal infection that occurs when the normal balance of bacteria in the vagina is disrupted. It results from an overgrowth of certain bacteria. This is the most common vaginal infection in women of childbearing age. Treatment is important to prevent complications, especially in pregnant women, as it can cause a premature delivery. °CAUSES  °Bacterial vaginosis is caused by an increase in harmful bacteria that are normally present in smaller amounts in the vagina. Several different kinds of bacteria can cause bacterial vaginosis. However, the reason that the condition develops is not fully understood. °RISK FACTORS °Certain activities or behaviors can put you at an increased risk of developing bacterial vaginosis, including: °· Having a new sex partner or multiple sex partners. °· Douching. °· Using an intrauterine device (IUD) for contraception. °Women do not get bacterial vaginosis from toilet seats, bedding, swimming pools, or contact with objects around them. °SIGNS AND SYMPTOMS  °Some women with bacterial vaginosis have no signs or symptoms. Common symptoms include: °· Grey vaginal discharge. °· A fishlike odor with discharge, especially after sexual intercourse. °· Itching or burning of the vagina and vulva. °· Burning or pain with urination. °DIAGNOSIS  °Your health care provider will take a medical history and examine the vagina for signs of bacterial vaginosis. A sample of vaginal fluid may be taken. Your health care provider will look at this sample under a microscope to check for bacteria and abnormal cells. A vaginal pH test may also be done.  °TREATMENT  °Bacterial vaginosis may be treated with antibiotic medicines. These may be given in the form of a pill or a vaginal cream. A second round of antibiotics may be prescribed if the condition comes back after treatment.  °HOME CARE INSTRUCTIONS  °· Only take over-the-counter or prescription medicines as  directed by your health care provider. °· If antibiotic medicine was prescribed, take it as directed. Make sure you finish it even if you start to feel better. °· Do not have sex until treatment is completed. °· Tell all sexual partners that you have a vaginal infection. They should see their health care provider and be treated if they have problems, such as a mild rash or itching. °· Practice safe sex by using condoms and only having one sex partner. °SEEK MEDICAL CARE IF:  °· Your symptoms are not improving after 3 days of treatment. °· You have increased discharge or pain. °· You have a fever. °MAKE SURE YOU:  °· Understand these instructions. °· Will watch your condition. °· Will get help right away if you are not doing well or get worse. °FOR MORE INFORMATION  °Centers for Disease Control and Prevention, Division of STD Prevention: www.cdc.gov/std °American Sexual Health Association (ASHA): www.ashastd.org  °Document Released: 08/28/2005 Document Revised: 06/18/2013 Document Reviewed: 04/09/2013 °ExitCare® Patient Information ©2015 [employer]. This information is not intended to replace advice given to you by your health care provider. Make sure you discuss any questions you have with your health care provider. °Candidal Vulvovaginitis °Candidal vulvovaginitis is an infection of the vagina and vulva. The vulva is the skin around the opening of the vagina. This may cause itching and discomfort in and around the vagina.  °HOME CARE °· Only take medicine as told by your doctor. °· Do not have sex (intercourse) until the infection is healed or as told by your doctor. °· Practice safe sex. °· Tell your sex partner about your infection. °· Do not douche or use tampons. °· Wear cotton   underwear. Do not wear tight pants or panty hose. °· Eat yogurt. This may help treat and prevent yeast infections. °GET HELP RIGHT AWAY IF:  °· You have a fever. °· Your problems get worse during treatment or do not get better in 3  days. °· You have discomfort, irritation, or itching in your vagina or vulva area. °· You have pain after sex. °· You start to get belly (abdominal) pain. °MAKE SURE YOU: °· Understand these instructions. °· Will watch your condition. °· Will get help right away if you are not doing well or get worse. °Document Released: 11/24/2008 Document Revised: 09/02/2013 Document Reviewed: 11/24/2008 °ExitCare® Patient Information ©2015 [employer]. This information is not intended to replace advice given to you by your health care provider. Make sure you discuss any questions you have with your health care provider. ° °

## 2014-09-22 LAB — GC/CHLAMYDIA PROBE AMP: CT Probe RNA: UNDETERMINED

## 2014-12-29 ENCOUNTER — Ambulatory Visit (INDEPENDENT_AMBULATORY_CARE_PROVIDER_SITE_OTHER): Payer: Medicaid Other | Admitting: Adult Health

## 2014-12-29 ENCOUNTER — Encounter: Payer: Self-pay | Admitting: Adult Health

## 2014-12-29 VITALS — BP 112/72 | HR 80 | Ht 63.5 in | Wt 176.0 lb

## 2014-12-29 DIAGNOSIS — N926 Irregular menstruation, unspecified: Secondary | ICD-10-CM

## 2014-12-29 DIAGNOSIS — Z3201 Encounter for pregnancy test, result positive: Secondary | ICD-10-CM | POA: Diagnosis not present

## 2014-12-29 DIAGNOSIS — O3680X0 Pregnancy with inconclusive fetal viability, not applicable or unspecified: Secondary | ICD-10-CM

## 2014-12-29 DIAGNOSIS — Z3490 Encounter for supervision of normal pregnancy, unspecified, unspecified trimester: Secondary | ICD-10-CM

## 2014-12-29 HISTORY — DX: Encounter for supervision of normal pregnancy, unspecified, unspecified trimester: Z34.90

## 2014-12-29 LAB — POCT URINE PREGNANCY: Preg Test, Ur: POSITIVE

## 2014-12-29 MED ORDER — PRENATAL PLUS 27-1 MG PO TABS: 1.0000 | ORAL_TABLET | Freq: Every day | ORAL | Status: DC

## 2014-12-29 NOTE — Patient Instructions (Signed)
First Trimester of Pregnancy The first trimester of pregnancy is from week 1 until the end of week 12 (months 1 through 3). A week after a sperm fertilizes an egg, the egg will implant on the wall of the uterus. This embryo will begin to develop into a baby. Genes from you and your partner are forming the baby. The female genes determine whether the baby is a boy or a girl. At 6-8 weeks, the eyes and face are formed, and the heartbeat can be seen on ultrasound. At the end of 12 weeks, all the baby's organs are formed.  Now that you are pregnant, you will want to do everything you can to have a healthy baby. Two of the most important things are to get good prenatal care and to follow your health care provider's instructions. Prenatal care is all the medical care you receive before the baby's birth. This care will help prevent, find, and treat any problems during the pregnancy and childbirth. BODY CHANGES Your body goes through many changes during pregnancy. The changes vary from woman to woman.   You may gain or lose a couple of pounds at first.  You may feel sick to your stomach (nauseous) and throw up (vomit). If the vomiting is uncontrollable, call your health care provider.  You may tire easily.  You may develop headaches that can be relieved by medicines approved by your health care provider.  You may urinate more often. Painful urination may mean you have a bladder infection.  You may develop heartburn as a result of your pregnancy.  You may develop constipation because certain hormones are causing the muscles that push waste through your intestines to slow down.  You may develop hemorrhoids or swollen, bulging veins (varicose veins).  Your breasts may begin to grow larger and become tender. Your nipples may stick out more, and the tissue that surrounds them (areola) may become darker.  Your gums may bleed and may be sensitive to brushing and flossing.  Dark spots or blotches (chloasma,  mask of pregnancy) may develop on your face. This will likely fade after the baby is born.  Your menstrual periods will stop.  You may have a loss of appetite.  You may develop cravings for certain kinds of food.  You may have changes in your emotions from day to day, such as being excited to be pregnant or being concerned that something may go wrong with the pregnancy and baby.  You may have more vivid and strange dreams.  You may have changes in your hair. These can include thickening of your hair, rapid growth, and changes in texture. Some women also have hair loss during or after pregnancy, or hair that feels dry or thin. Your hair will most likely return to normal after your baby is born. WHAT TO EXPECT AT YOUR PRENATAL VISITS During a routine prenatal visit:  You will be weighed to make sure you and the baby are growing normally.  Your blood pressure will be taken.  Your abdomen will be measured to track your baby's growth.  The fetal heartbeat will be listened to starting around week 10 or 12 of your pregnancy.  Test results from any previous visits will be discussed. Your health care provider may ask you:  How you are feeling.  If you are feeling the baby move.  If you have had any abnormal symptoms, such as leaking fluid, bleeding, severe headaches, or abdominal cramping.  If you have any questions. Other tests   that may be performed during your first trimester include:  Blood tests to find your blood type and to check for the presence of any previous infections. They will also be used to check for low iron levels (anemia) and Rh antibodies. Later in the pregnancy, blood tests for diabetes will be done along with other tests if problems develop.  Urine tests to check for infections, diabetes, or protein in the urine.  An ultrasound to confirm the proper growth and development of the baby.  An amniocentesis to check for possible genetic problems.  Fetal screens for  spina bifida and Down syndrome.  You may need other tests to make sure you and the baby are doing well. HOME CARE INSTRUCTIONS  Medicines  Follow your health care provider's instructions regarding medicine use. Specific medicines may be either safe or unsafe to take during pregnancy.  Take your prenatal vitamins as directed.  If you develop constipation, try taking a stool softener if your health care provider approves. Diet  Eat regular, well-balanced meals. Choose a variety of foods, such as meat or vegetable-based protein, fish, milk and low-fat dairy products, vegetables, fruits, and whole grain breads and cereals. Your health care provider will help you determine the amount of weight gain that is right for you.  Avoid raw meat and uncooked cheese. These carry germs that can cause birth defects in the baby.  Eating four or five small meals rather than three large meals a day may help relieve nausea and vomiting. If you start to feel nauseous, eating a few soda crackers can be helpful. Drinking liquids between meals instead of during meals also seems to help nausea and vomiting.  If you develop constipation, eat more high-fiber foods, such as fresh vegetables or fruit and whole grains. Drink enough fluids to keep your urine clear or pale yellow. Activity and Exercise  Exercise only as directed by your health care provider. Exercising will help you:  Control your weight.  Stay in shape.  Be prepared for labor and delivery.  Experiencing pain or cramping in the lower abdomen or low back is a good sign that you should stop exercising. Check with your health care provider before continuing normal exercises.  Try to avoid standing for long periods of time. Move your legs often if you must stand in one place for a long time.  Avoid heavy lifting.  Wear low-heeled shoes, and practice good posture.  You may continue to have sex unless your health care provider directs you  otherwise. Relief of Pain or Discomfort  Wear a good support bra for breast tenderness.   Take warm sitz baths to soothe any pain or discomfort caused by hemorrhoids. Use hemorrhoid cream if your health care provider approves.   Rest with your legs elevated if you have leg cramps or low back pain.  If you develop varicose veins in your legs, wear support hose. Elevate your feet for 15 minutes, 3-4 times a day. Limit salt in your diet. Prenatal Care  Schedule your prenatal visits by the twelfth week of pregnancy. They are usually scheduled monthly at first, then more often in the last 2 months before delivery.  Write down your questions. Take them to your prenatal visits.  Keep all your prenatal visits as directed by your health care provider. Safety  Wear your seat belt at all times when driving.  Make a list of emergency phone numbers, including numbers for family, friends, the hospital, and police and fire departments. General Tips    Ask your health care provider for a referral to a local prenatal education class. Begin classes no later than at the beginning of month 6 of your pregnancy.  Ask for help if you have counseling or nutritional needs during pregnancy. Your health care provider can offer advice or refer you to specialists for help with various needs.  Do not use hot tubs, steam rooms, or saunas.  Do not douche or use tampons or scented sanitary pads.  Do not cross your legs for long periods of time.  Avoid cat litter boxes and soil used by cats. These carry germs that can cause birth defects in the baby and possibly loss of the fetus by miscarriage or stillbirth.  Avoid all smoking, herbs, alcohol, and medicines not prescribed by your health care provider. Chemicals in these affect the formation and growth of the baby.  Schedule a dentist appointment. At home, brush your teeth with a soft toothbrush and be gentle when you floss. SEEK MEDICAL CARE IF:   You have  dizziness.  You have mild pelvic cramps, pelvic pressure, or nagging pain in the abdominal area.  You have persistent nausea, vomiting, or diarrhea.  You have a bad smelling vaginal discharge.  You have pain with urination.  You notice increased swelling in your face, hands, legs, or ankles. SEEK IMMEDIATE MEDICAL CARE IF:   You have a fever.  You are leaking fluid from your vagina.  You have spotting or bleeding from your vagina.  You have severe abdominal cramping or pain.  You have rapid weight gain or loss.  You vomit blood or material that looks like coffee grounds.  You are exposed to German measles and have never had them.  You are exposed to fifth disease or chickenpox.  You develop a severe headache.  You have shortness of breath.  You have any kind of trauma, such as from a fall or a car accident. Document Released: 08/22/2001 Document Revised: 01/12/2014 Document Reviewed: 07/08/2013 ExitCare Patient Information 2015 Exit[employer], [employer]. This information is not intended to replace advice given to you by your health care provider. Make sure you discuss any questions you have with your health care provider. Return in 2 weeks for dating US 

## 2014-12-29 NOTE — Progress Notes (Signed)
Subjective:     Patient ID: Amanda SettlerBriana Perez, female   DOB: 06/17/1991, 24 y.o.   MRN: [id number]  HPI [name] is a 24 year old black female on for missed period, requests UPT.This was not planned  Review of Systems +missed periods Patient denies any headaches, hearing loss, fatigue, blurred vision, shortness of breath, chest pain, abdominal pain, problems with bowel movements, urination, or intercourse. No joint pain or mood swings.Had some cramping Sunday but it stopped. Reviewed past medical,surgical, social and family history. Reviewed medications and allergies.     Objective:   Physical Exam BP 112/72 mmHg  Pulse 80  Ht 5' 3.5" (1.613 m)  Wt 176 lb (79.833 kg)  BMI 30.68 kg/m2  LMP 11/11/2014  Breastfeeding? No Skin warm and dry. Neck: mid line trachea, normal thyroid, good ROM, no lymphadenopathy noted. Lungs: clear to ausculation bilaterally. Cardiovascular: regular rate and rhythm.abdomen soft and non tender  UPT +, about 6+6 weeks with EDD 08/18/15 by LMP, medicaid form given.    Assessment:     Pregnant +UPT    Plan:     Rx prenatal plus #30 1 daily with 11 refills Return in 2 weeks for dating US Review handout on first trimester

## 2015-01-15 ENCOUNTER — Ambulatory Visit (INDEPENDENT_AMBULATORY_CARE_PROVIDER_SITE_OTHER): Payer: Medicaid Other

## 2015-01-15 DIAGNOSIS — O3680X0 Pregnancy with inconclusive fetal viability, not applicable or unspecified: Secondary | ICD-10-CM | POA: Diagnosis not present

## 2015-01-15 NOTE — Progress Notes (Signed)
US 4215w2d single IUP w/ys, pos fht 164bpm,normal ov bilat

## 2015-01-25 ENCOUNTER — Telehealth: Payer: Self-pay | Admitting: Women's Health

## 2015-01-25 ENCOUNTER — Ambulatory Visit (INDEPENDENT_AMBULATORY_CARE_PROVIDER_SITE_OTHER): Payer: Medicaid Other | Admitting: Women's Health

## 2015-01-25 ENCOUNTER — Encounter: Payer: Self-pay | Admitting: Women's Health

## 2015-01-25 VITALS — BP 104/58 | HR 84 | Wt 178.0 lb

## 2015-01-25 DIAGNOSIS — R300 Dysuria: Secondary | ICD-10-CM

## 2015-01-25 DIAGNOSIS — O26891 Other specified pregnancy related conditions, first trimester: Secondary | ICD-10-CM

## 2015-01-25 DIAGNOSIS — Z331 Pregnant state, incidental: Secondary | ICD-10-CM

## 2015-01-25 DIAGNOSIS — Z1389 Encounter for screening for other disorder: Secondary | ICD-10-CM

## 2015-01-25 DIAGNOSIS — O26851 Spotting complicating pregnancy, first trimester: Secondary | ICD-10-CM

## 2015-01-25 LAB — POCT URINALYSIS DIPSTICK
Blood, UA: NEGATIVE
Glucose, UA: NEGATIVE
KETONES UA: NEGATIVE
Leukocytes, UA: NEGATIVE
Nitrite, UA: NEGATIVE
PROTEIN UA: NEGATIVE

## 2015-01-25 LAB — POCT WET PREP (WET MOUNT): Clue Cells Wet Prep Whiff POC: NEGATIVE

## 2015-01-25 NOTE — Progress Notes (Addendum)
   [facility] Visit  Patient name: Amanda Perez MRN [id number]  Date of birth: 11/18/1990  CC & HPI:  Amanda Perez is a 24 y.o. African American G2P1001 female at 2563w5d, new ob visit scheduled next Monday, presenting today for report of light brown d/c since Thursday. Some abdominal discomfort after voiding. Denies other abnormal d/c, itching/odor/irritation. Some mild nausea while working, no vomiting. Taking pnv at night. Not currently sexually active.   Pertinent History Reviewed:  Medical & Surgical Hx:   Past Medical History  Diagnosis Date  . Other disorder of menstruation and other abnormal bleeding from female genital tract 07/10/2013    Bleeding daily since IUD inserted  . HSV-2 (herpes simplex virus 2) infection   . Pregnant 12/29/2014   Past Surgical History  Procedure Laterality Date  . No past surgeries     Medications: Reviewed & Updated - see associated section Social History: Reviewed -  reports that she has never smoked. She has never used smokeless tobacco.  Objective Findings:  Vitals: BP 104/58 mmHg  Pulse 84  Wt 178 lb (80.74 kg)  LMP 11/11/2014  Physical Examination: General appearance - alert, well appearing, and in no distress Pelvic - normal external genitalia, cx visually closed, small-mod amt thick light brown nonodorous d/c  Results for orders placed or performed in visit on 01/25/15 (from the past 24 hour(s))  POCT Urinalysis Dipstick   Collection Time: 01/25/15  3:27 PM  Result Value Ref Range   Color, UA     Clarity, UA     Glucose, UA neg    Bilirubin, UA     Ketones, UA neg    Spec Grav, UA     Blood, UA neg    pH, UA     Protein, UA neg    Urobilinogen, UA     Nitrite, UA neg    Leukocytes, UA Negative   POCT Wet Prep Mellody Drown(Wet [place])   Collection Time: 01/25/15  4:12 PM  Result Value Ref Range   Source Wet Prep POC vaginal    WBC, Wet Prep HPF POC few    Bacteria Wet Prep HPF POC none    BACTERIA WET PREP MORPHOLOGY POC      Clue Cells Wet Prep HPF POC None    Clue Cells Wet Prep Whiff POC Negative Whiff    Yeast Wet Prep HPF POC None    KOH Wet Prep POC     Trichomonas Wet Prep HPF POC none      Assessment & Plan:  A:   10163w5d SIUP  Vag spotting 1st trimester  Rh+  Abdominal discomfort post voiding P:  GC/CT from urine, urine culture  Pelvic rest until at least 7d from any vaginal color   Gave printed info on tips to help w/ nausea, let us know if she needs meds   Gave work note for frequent breaks to snack/use br  F/U Monday as scheduled for new ob visit   Marge DuncansBooker, Louine Tenpenny Randall CNM, [facility]NP-BC 01/25/2015 4:13 PM

## 2015-01-25 NOTE — Telephone Encounter (Signed)
Pt states she is having a brownish vaginal dc, she is about 10.[redacted] wks pregnant and her new OB appointment is not until late next week.  Pt advised needs to be seen for the dc and call was transferred to front staff for appointment to be made.

## 2015-01-25 NOTE — Patient Instructions (Addendum)
Nausea & Vomiting  Have saltine crackers or pretzels by your bed and eat a few bites before you raise your head out of bed in the morning  Eat small frequent meals throughout the day instead of large meals  Drink plenty of fluids throughout the day to stay hydrated, just don't drink a lot of fluids with your meals.  This can make your stomach fill up faster making you feel sick  Do not brush your teeth right after you eat  Products with real ginger are good for nausea, like ginger ale and ginger hard candy Make sure it says made with real ginger!  Sucking on sour candy like lemon heads is also good for nausea  If your prenatal vitamins make you nauseated, take them at night so you will sleep through the nausea  Sea Bands  If you feel like you need medicine for the nausea & vomiting please let us know  If you are unable to keep any fluids or food down please let us know    Vaginal Bleeding During Pregnancy, First Trimester A small amount of bleeding (spotting) from the vagina is relatively common in early pregnancy. It usually stops on its own. Various things may cause bleeding or spotting in early pregnancy. Some bleeding may be related to the pregnancy, and some may not. In most cases, the bleeding is normal and is not a problem. However, bleeding can also be a sign of something serious. Be sure to tell your health care provider about any vaginal bleeding right away. Some possible causes of vaginal bleeding during the first trimester include: 11. Infection or inflammation of the cervix. 12. Growths (polyps) on the cervix. 13. Miscarriage or threatened miscarriage. 14. Pregnancy tissue has developed outside of the uterus and in a fallopian tube (tubal pregnancy). 15. Tiny cysts have developed in the uterus instead of pregnancy tissue (molar pregnancy). HOME CARE INSTRUCTIONS  Watch your condition for any changes. The following actions may help to lessen any discomfort you are  feeling:  Follow your health care provider's instructions for limiting your activity. If your health care provider orders bed rest, you may need to stay in bed and only get up to use the bathroom. However, your health care provider may allow you to continue light activity.  If needed, make plans for someone to help with your regular activities and responsibilities while you are on bed rest.  Keep track of the number of pads you use each day, how often you change pads, and how soaked (saturated) they are. Write this down.  Do not use tampons. Do not douche.  Do not have sexual intercourse or orgasms until approved by your health care provider.  If you pass any tissue from your vagina, save the tissue so you can show it to your health care provider.  Only take over-the-counter or prescription medicines as directed by your health care provider.  Do not take aspirin because it can make you bleed.  Keep all follow-up appointments as directed by your health care provider. SEEK MEDICAL CARE IF:  You have any vaginal bleeding during any part of your pregnancy.  You have cramps or labor pains.  You have a fever, not controlled by medicine. SEEK IMMEDIATE MEDICAL CARE IF:   You have severe cramps in your back or belly (abdomen).  You pass large clots or tissue from your vagina.  Your bleeding increases.  You feel light-headed or weak, or you have fainting episodes.  You have chills.  You are leaking fluid or have a gush of fluid from your vagina.  You pass out while having a bowel movement. MAKE SURE YOU:  Understand these instructions.  Will watch your condition.  Will get help right away if you are not doing well or get worse. Document Released: 06/07/2005 Document Revised: 09/02/2013 Document Reviewed: 05/05/2013 [facility] Patient Information 2015 [place] ViewExitCare, [place]. This information is not intended to replace advice given to you by your health care provider. Make sure you  discuss any questions you have with your health care provider.

## 2015-01-27 ENCOUNTER — Encounter: Payer: Medicaid Other | Admitting: Women's Health

## 2015-01-27 LAB — URINE CULTURE

## 2015-01-30 LAB — GC/CHLAMYDIA PROBE AMP
CHLAMYDIA, DNA PROBE: POSITIVE — AB
Neisseria gonorrhoeae by PCR: NEGATIVE

## 2015-02-01 ENCOUNTER — Encounter: Payer: Medicaid Other | Admitting: Advanced Practice Midwife

## 2015-02-02 ENCOUNTER — Telehealth: Payer: Self-pay | Admitting: Women's Health

## 2015-02-02 DIAGNOSIS — O98811 Other maternal infectious and parasitic diseases complicating pregnancy, first trimester: Principal | ICD-10-CM

## 2015-02-02 DIAGNOSIS — A749 Chlamydial infection, unspecified: Secondary | ICD-10-CM

## 2015-02-02 MED ORDER — AZITHROMYCIN 500 MG PO TABS: 1000.0000 mg | ORAL_TABLET | Freq: Once | ORAL | Status: DC

## 2015-02-02 NOTE — Telephone Encounter (Signed)
Notified pt of +CT, rx for azithromycin sent to her pharmacy. Not currently sexually active, is no longer w/ previous partner- does not want me to treat him. POC in 4wks.  Cheral MarkerKimberly R. Booker, CNM, [facility]NP-BC 02/02/2015 12:56 PM

## 2015-02-09 ENCOUNTER — Encounter: Payer: Self-pay | Admitting: Advanced Practice Midwife

## 2015-02-09 ENCOUNTER — Ambulatory Visit (INDEPENDENT_AMBULATORY_CARE_PROVIDER_SITE_OTHER): Payer: Medicaid Other | Admitting: Advanced Practice Midwife

## 2015-02-09 VITALS — BP 100/60 | HR 96 | Wt 175.5 lb

## 2015-02-09 DIAGNOSIS — Z3491 Encounter for supervision of normal pregnancy, unspecified, first trimester: Secondary | ICD-10-CM | POA: Diagnosis not present

## 2015-02-09 DIAGNOSIS — Z3490 Encounter for supervision of normal pregnancy, unspecified, unspecified trimester: Secondary | ICD-10-CM

## 2015-02-09 DIAGNOSIS — Z3682 Encounter for antenatal screening for nuchal translucency: Secondary | ICD-10-CM

## 2015-02-09 DIAGNOSIS — Z369 Encounter for antenatal screening, unspecified: Secondary | ICD-10-CM

## 2015-02-09 DIAGNOSIS — Z331 Pregnant state, incidental: Secondary | ICD-10-CM | POA: Diagnosis not present

## 2015-02-09 DIAGNOSIS — Z1389 Encounter for screening for other disorder: Secondary | ICD-10-CM | POA: Diagnosis not present

## 2015-02-09 LAB — POCT URINALYSIS DIPSTICK
Glucose, UA: NEGATIVE
KETONES UA: NEGATIVE
Leukocytes, UA: NEGATIVE
Nitrite, UA: NEGATIVE
PROTEIN UA: NEGATIVE
RBC UA: NEGATIVE

## 2015-02-09 MED ORDER — DOXYLAMINE-PYRIDOXINE 10-10 MG PO TBEC: DELAYED_RELEASE_TABLET | ORAL | Status: DC

## 2015-02-09 NOTE — Progress Notes (Signed)
Pt given [facility] form and lab consents to read over and sign. Pt denies any problems or concerns at this time.  

## 2015-02-09 NOTE — Progress Notes (Signed)
  Subjective:    Amanda Perez is a G2P1001 4735w6d being seen today for her first obstetrical visit.  Her obstetrical history is significant for SVD at term without commplications..  Pregnancy history fully reviewed.  Patient reports nausea.  Filed Vitals:   02/09/15 1534  BP: 100/60  Pulse: 96  Weight: 175 lb 8 oz (79.606 kg)    HISTORY: OB History  Gravida Para Term Preterm AB SAB TAB Ectopic Multiple Living  2 1 1       1     # Outcome Date GA Lbr Len/2nd Weight Sex Delivery Anes PTL Lv  2 Current           1 Term 05/13/13 8142w1d 19:16 / 02:00 7 lb 8.1 oz (3.405 kg) F Vag-Spont EPI  Y     Past Medical History  Diagnosis Date  . Other disorder of menstruation and other abnormal bleeding from female genital tract 07/10/2013    Bleeding daily since IUD inserted  . HSV-2 (herpes simplex virus 2) infection   . Pregnant 12/29/2014   Past Surgical History  Procedure Laterality Date  . No past surgeries     Family History  Problem Relation Age of Onset  . Hypertension Mother   . Seizures Mother   . Hypertension Paternal Grandmother   . Hypertension Maternal Grandmother      Exam       Pelvic Exam:    Perineum: Normal Perineum   Vulva: normal   Vagina:  normal mucosa, normal discharge, no palpable nodules   Uterus Normal, Gravid, FH: 12     Cervix: normal   Adnexa: Not palpable   Urinary:  urethral meatus normal    System: Breast:  normal appearance, no masses or tenderness   Skin: normal coloration and turgor, no rashes    Neurologic: oriented, normal, normal mood   Extremities: normal strength, tone, and muscle mass   HEENT PERRLA   Mouth/Teeth mucous membranes moist, normal dentition   Neck supple and no masses   Cardiovascular: regular rate and rhythm   Respiratory:  appears well, vitals normal, no respiratory distress, acyanotic   Abdomen: soft, non-tender;  FHR: 160          Assessment:    Pregnancy: G2P1001 Patient Active Problem List   Diagnosis Date Noted  . Chlamydia infection affecting pregnancy in first trimester, antepartum 02/02/2015  . Pregnant 12/29/2014  . HSV-2 (herpes simplex virus 2) infection   . Encounter for IUD removal 06/17/2014  . Other disorder of menstruation and other abnormal bleeding from female genital tract 07/10/2013  . Encounter for insertion of mirena IUD 06/19/2013        Plan:     Initial labs drawn. Continue prenatal vitamins  Problem list reviewed and updated  Reviewed n/v relief measures and warning s/s to report  Reviewed recommended weight gain based on pre-gravid BMI  Encouraged well-balanced diet Genetic Screening discussed Integrated Screen: requested.  Ultrasound discussed; fetal survey: requested.  Follow up ASAP for NT/IT.  Amanda Perez 02/09/2015

## 2015-02-10 ENCOUNTER — Ambulatory Visit (INDEPENDENT_AMBULATORY_CARE_PROVIDER_SITE_OTHER): Payer: Medicaid Other

## 2015-02-10 ENCOUNTER — Other Ambulatory Visit: Payer: Medicaid Other

## 2015-02-10 DIAGNOSIS — Z3682 Encounter for antenatal screening for nuchal translucency: Secondary | ICD-10-CM

## 2015-02-10 DIAGNOSIS — Z36 Encounter for antenatal screening of mother: Secondary | ICD-10-CM | POA: Diagnosis not present

## 2015-02-10 NOTE — Progress Notes (Signed)
[place] 13wks c/w dates,normal ov's bilat,ant pl,nb present,NT 1.53MM,FHT 153bpm

## 2015-02-11 LAB — HIV ANTIBODY (ROUTINE TESTING W REFLEX): HIV Screen 4th Generation wRfx: NONREACTIVE

## 2015-02-11 LAB — PMP SCREEN PROFILE (10S), URINE
Amphetamine Screen, Ur: NEGATIVE ng/mL
BARBITURATE SCRN UR: NEGATIVE ng/mL
BENZODIAZEPINE SCREEN, URINE: NEGATIVE ng/mL
Cannabinoids Ur Ql Scn: NEGATIVE ng/mL
Cocaine(Metab.)Screen, Urine: NEGATIVE ng/mL
Creatinine(Crt), U: 49.7 mg/dL (ref 20.0–300.0)
Methadone Scn, Ur: NEGATIVE ng/mL
OXYCODONE+OXYMORPHONE UR QL SCN: NEGATIVE ng/mL
Opiate Scrn, Ur: NEGATIVE ng/mL
PCP Scrn, Ur: NEGATIVE ng/mL
Ph of Urine: 7.1 (ref 4.5–8.9)
Propoxyphene, Screen: NEGATIVE ng/mL

## 2015-02-11 LAB — ANTIBODY SCREEN: Antibody Screen: NEGATIVE

## 2015-02-11 LAB — URINALYSIS, ROUTINE W REFLEX MICROSCOPIC
Bilirubin, UA: NEGATIVE
Glucose, UA: NEGATIVE
Ketones, UA: NEGATIVE
Leukocytes, UA: NEGATIVE
Nitrite, UA: NEGATIVE
PH UA: 7.5 (ref 5.0–7.5)
Protein, UA: NEGATIVE
RBC, UA: NEGATIVE
Specific Gravity, UA: 1.009 (ref 1.005–1.030)
Urobilinogen, Ur: 0.2 mg/dL (ref 0.2–1.0)

## 2015-02-11 LAB — RUBELLA SCREEN: Rubella Antibodies, IGG: 3.42 index (ref >0.99)

## 2015-02-11 LAB — ABO/RH: Rh Factor: POSITIVE

## 2015-02-11 LAB — CBC
HEMATOCRIT: 37.6 % (ref 34.0–46.6)
Hemoglobin: 12.9 g/dL (ref 11.1–15.9)
MCH: 29.9 pg (ref 26.6–33.0)
MCHC: 34.3 g/dL (ref 31.5–35.7)
MCV: 87 fL (ref 79–97)
PLATELETS: 175 10*3/uL (ref 150–379)
RBC: 4.32 x10E6/uL (ref 3.77–5.28)
RDW: 12.1 % — AB (ref 12.3–15.4)
WBC: 5.4 10*3/uL (ref 3.4–10.8)

## 2015-02-11 LAB — HEPATITIS B SURFACE ANTIGEN: Hepatitis B Surface Ag: NEGATIVE

## 2015-02-11 LAB — URINE CULTURE

## 2015-02-11 LAB — RPR: RPR Ser Ql: NONREACTIVE

## 2015-02-12 LAB — MATERNAL SCREEN, INTEGRATED #1
Crown Rump Length: 76.4 mm
GEST. AGE ON COLLECTION DATE: 13.3 wk
Maternal Age at EDD: 23.4 years
NUCHAL TRANSLUCENCY (NT): 1.5 mm
Number of Fetuses: 1
PAPP-A Value: 3243.9 ng/mL
WEIGHT: 178 [lb_av]

## 2015-03-09 ENCOUNTER — Ambulatory Visit (INDEPENDENT_AMBULATORY_CARE_PROVIDER_SITE_OTHER): Payer: Medicaid Other | Admitting: Women's Health

## 2015-03-09 ENCOUNTER — Encounter: Payer: Self-pay | Admitting: Women's Health

## 2015-03-09 VITALS — BP 120/76 | HR 76 | Wt 181.0 lb

## 2015-03-09 DIAGNOSIS — Z1389 Encounter for screening for other disorder: Secondary | ICD-10-CM

## 2015-03-09 DIAGNOSIS — O98312 Other infections with a predominantly sexual mode of transmission complicating pregnancy, second trimester: Secondary | ICD-10-CM

## 2015-03-09 DIAGNOSIS — A749 Chlamydial infection, unspecified: Secondary | ICD-10-CM

## 2015-03-09 DIAGNOSIS — Z331 Pregnant state, incidental: Secondary | ICD-10-CM

## 2015-03-09 DIAGNOSIS — Z369 Encounter for antenatal screening, unspecified: Secondary | ICD-10-CM

## 2015-03-09 DIAGNOSIS — Z3492 Encounter for supervision of normal pregnancy, unspecified, second trimester: Secondary | ICD-10-CM

## 2015-03-09 DIAGNOSIS — Z363 Encounter for antenatal screening for malformations: Secondary | ICD-10-CM

## 2015-03-09 LAB — POCT URINALYSIS DIPSTICK
Blood, UA: NEGATIVE
Glucose, UA: NEGATIVE
Ketones, UA: NEGATIVE
LEUKOCYTES UA: NEGATIVE
NITRITE UA: NEGATIVE
PROTEIN UA: NEGATIVE

## 2015-03-09 NOTE — Patient Instructions (Signed)
Second Trimester of Pregnancy The second trimester is from week 13 through week 28, months 4 through 6. The second trimester is often a time when you feel your best. Your body has also adjusted to being pregnant, and you begin to feel better physically. Usually, morning sickness has lessened or quit completely, you may have more energy, and you may have an increase in appetite. The second trimester is also a time when the fetus is growing rapidly. At the end of the sixth month, the fetus is about 9 inches long and weighs about 1 pounds. You will likely begin to feel the baby move (quickening) between 18 and 20 weeks of the pregnancy. BODY CHANGES Your body goes through many changes during pregnancy. The changes vary from woman to woman.   Your weight will continue to increase. You will notice your lower abdomen bulging out.  You may begin to get stretch marks on your hips, abdomen, and breasts.  You may develop headaches that can be relieved by medicines approved by your health care provider.  You may urinate more often because the fetus is pressing on your bladder.  You may develop or continue to have heartburn as a result of your pregnancy.  You may develop constipation because certain hormones are causing the muscles that push waste through your intestines to slow down.  You may develop hemorrhoids or swollen, bulging veins (varicose veins).  You may have back pain because of the weight gain and pregnancy hormones relaxing your joints between the bones in your pelvis and as a result of a shift in weight and the muscles that support your balance.  Your breasts will continue to grow and be tender.  Your gums may bleed and may be sensitive to brushing and flossing.  Dark spots or blotches (chloasma, mask of pregnancy) may develop on your face. This will likely fade after the baby is born.  A dark line from your belly button to the pubic area (linea nigra) may appear. This will likely fade  after the baby is born.  You may have changes in your hair. These can include thickening of your hair, rapid growth, and changes in texture. Some women also have hair loss during or after pregnancy, or hair that feels dry or thin. Your hair will most likely return to normal after your baby is born. WHAT TO EXPECT AT YOUR PRENATAL VISITS During a routine prenatal visit:  You will be weighed to make sure you and the fetus are growing normally.  Your blood pressure will be taken.  Your abdomen will be measured to track your baby's growth.  The fetal heartbeat will be listened to.  Any test results from the previous visit will be discussed. Your health care provider may ask you:  How you are feeling.  If you are feeling the baby move.  If you have had any abnormal symptoms, such as leaking fluid, bleeding, severe headaches, or abdominal cramping.  If you have any questions. Other tests that may be performed during your second trimester include:  Blood tests that check for:  Low iron levels (anemia).  Gestational diabetes (between 24 and 28 weeks).  Rh antibodies.  Urine tests to check for infections, diabetes, or protein in the urine.  An ultrasound to confirm the proper growth and development of the baby.  An amniocentesis to check for possible genetic problems.  Fetal screens for spina bifida and Down syndrome. HOME CARE INSTRUCTIONS   Avoid all smoking, herbs, alcohol, and unprescribed   drugs. These chemicals affect the formation and growth of the baby.  Follow your health care provider's instructions regarding medicine use. There are medicines that are either safe or unsafe to take during pregnancy.  Exercise only as directed by your health care provider. Experiencing uterine cramps is a good sign to stop exercising.  Continue to eat regular, healthy meals.  Wear a good support bra for breast tenderness.  Do not use hot tubs, steam rooms, or saunas.  Wear your  seat belt at all times when driving.  Avoid raw meat, uncooked cheese, cat litter boxes, and soil used by cats. These carry germs that can cause birth defects in the baby.  Take your prenatal vitamins.  Try taking a stool softener (if your health care provider approves) if you develop constipation. Eat more high-fiber foods, such as fresh vegetables or fruit and whole grains. Drink plenty of fluids to keep your urine clear or pale yellow.  Take warm sitz baths to soothe any pain or discomfort caused by hemorrhoids. Use hemorrhoid cream if your health care provider approves.  If you develop varicose veins, wear support hose. Elevate your feet for 15 minutes, 3-4 times a day. Limit salt in your diet.  Avoid heavy lifting, wear low heel shoes, and practice good posture.  Rest with your legs elevated if you have leg cramps or low back pain.  Visit your dentist if you have not gone yet during your pregnancy. Use a soft toothbrush to brush your teeth and be gentle when you floss.  A sexual relationship may be continued unless your health care provider directs you otherwise.  Continue to go to all your prenatal visits as directed by your health care provider. SEEK MEDICAL CARE IF:   You have dizziness.  You have mild pelvic cramps, pelvic pressure, or nagging pain in the abdominal area.  You have persistent nausea, vomiting, or diarrhea.  You have a bad smelling vaginal discharge.  You have pain with urination. SEEK IMMEDIATE MEDICAL CARE IF:   You have a fever.  You are leaking fluid from your vagina.  You have spotting or bleeding from your vagina.  You have severe abdominal cramping or pain.  You have rapid weight gain or loss.  You have shortness of breath with chest pain.  You notice sudden or extreme swelling of your face, hands, ankles, feet, or legs.  You have not felt your baby move in over an hour.  You have severe headaches that do not go away with  medicine.  You have vision changes. Document Released: 08/22/2001 Document Revised: 09/02/2013 Document Reviewed: 10/29/2012 [employer] Patient Information 2015 [employer]. This information is not intended to replace advice given to you by your health care provider. Make sure you discuss any questions you have with your health care provider.  

## 2015-03-09 NOTE — Progress Notes (Signed)
Pt denies any problems or concerns at this time.  

## 2015-03-09 NOTE — Progress Notes (Signed)
Low-risk OB appointment G2P1001 9035w6d Estimated Date of Delivery: 08/18/15 BP 120/76 mmHg  Pulse 76  Wt 181 lb (82.101 kg)  LMP 11/11/2014  BP, weight, and urine reviewed.  Refer to obstetrical flow sheet for FH & FHR.  Reports good fm.  Denies regular uc's, lof, vb, or uti s/s. No complaints. Reviewed warning s/s to report. Plan:  Continue routine obstetrical care  F/U in 4wks for OB appointment and anatomy u/s 2nd IT & CT POC today

## 2015-03-10 LAB — GC/CHLAMYDIA PROBE AMP
Chlamydia trachomatis, NAA: NEGATIVE
Neisseria gonorrhoeae by PCR: NEGATIVE

## 2015-03-11 LAB — MATERNAL SCREEN, INTEGRATED #2
AFP MoM: 1.91
Alpha-Fetoprotein: 65 ng/mL
CROWN RUMP LENGTH: 76.4 mm
DIA MoM: 1.04
DIA Value: 159.2 pg/mL
Estriol, Unconjugated: 1.33 ng/mL
GESTATIONAL AGE: 17.1 wk
Gest. Age on Collection Date: 13.3 weeks
HCG MOM: 1.21
MATERNAL AGE AT EDD: 23.4 a
Nuchal Translucency (NT): 1.5 mm
Nuchal Translucency MoM: 0.85
Number of Fetuses: 1
PAPP-A MoM: 2.99
PAPP-A Value: 3243.9 ng/mL
TEST RESULTS: NEGATIVE
WEIGHT: 178 [lb_av]
Weight: 181 [lb_av]
hCG Value: 29.9 IU/mL
uE3 MoM: 1.29

## 2015-03-18 ENCOUNTER — Ambulatory Visit (INDEPENDENT_AMBULATORY_CARE_PROVIDER_SITE_OTHER): Payer: Medicaid Other | Admitting: Obstetrics and Gynecology

## 2015-03-18 ENCOUNTER — Telehealth: Payer: Self-pay | Admitting: Advanced Practice Midwife

## 2015-03-18 ENCOUNTER — Encounter: Payer: Self-pay | Admitting: Obstetrics and Gynecology

## 2015-03-18 VITALS — BP 120/70 | HR 88 | Wt 182.0 lb

## 2015-03-18 DIAGNOSIS — Z331 Pregnant state, incidental: Secondary | ICD-10-CM

## 2015-03-18 DIAGNOSIS — Z1389 Encounter for screening for other disorder: Secondary | ICD-10-CM

## 2015-03-18 DIAGNOSIS — B3731 Acute candidiasis of vulva and vagina: Secondary | ICD-10-CM | POA: Insufficient documentation

## 2015-03-18 DIAGNOSIS — B373 Candidiasis of vulva and vagina: Secondary | ICD-10-CM | POA: Insufficient documentation

## 2015-03-18 LAB — POCT URINALYSIS DIPSTICK
Blood, UA: NEGATIVE
Glucose, UA: NEGATIVE
KETONES UA: NEGATIVE
LEUKOCYTES UA: NEGATIVE
Nitrite, UA: NEGATIVE
PROTEIN UA: NEGATIVE

## 2015-03-18 MED ORDER — FLUCONAZOLE 150 MG PO TABS
150.0000 mg | ORAL_TABLET | Freq: Once | ORAL | Status: DC
Start: 2015-03-18 — End: 2015-07-28

## 2015-03-18 MED ORDER — NYSTATIN 100000 UNIT/GM EX CREA: 1.0000 "application " | TOPICAL_CREAM | Freq: Two times a day (BID) | CUTANEOUS | Status: DC

## 2015-03-18 NOTE — Progress Notes (Signed)
Pt worked in today for vaginal discharge, pt has had since Tuesday.

## 2015-03-18 NOTE — Progress Notes (Signed)
[facility], + frequency. No vag bleeding. Had uti's and yeast infxn in the past. g2P1001 7543w1d Estimated Date of Delivery: 08/18/15  Blood pressure 120/70, pulse 88, weight 182 lb (82.555 kg), last menstrual period 11/11/2014, not currently breastfeeding.   refer to the ob flow sheet for FH and FHR, also BP, Wt, Urine results:notable for negative for protein, nitrates  Patient reports   good fetal movement, denies any bleeding and no rupture of membranes symptoms or regular contractions. Patient complaints:vulvar irritation. KOH + yeast  Wet prep no trich. + yeast Questions were answered. Assessment:  Monilia vulvovaginitis Plan:  Continued routine obstetrical care, Rx Diflucan, Rx Nystatin  F/u in 3  weeks for u/s

## 2015-03-18 NOTE — Telephone Encounter (Signed)
Spoke with pt. Pt has a vaginal discharge with no odor. She is uncomfortable in her vaginal area; she feels swollen. No pain or burning with urination. + urinary frequency. I advised she would need an appt. Pt voiced understanding. Call transferred to front desk for appt. JSY

## 2015-04-06 ENCOUNTER — Encounter: Payer: Self-pay | Admitting: Women's Health

## 2015-04-06 ENCOUNTER — Ambulatory Visit (INDEPENDENT_AMBULATORY_CARE_PROVIDER_SITE_OTHER): Payer: Medicaid Other | Admitting: Women's Health

## 2015-04-06 ENCOUNTER — Ambulatory Visit (INDEPENDENT_AMBULATORY_CARE_PROVIDER_SITE_OTHER): Payer: Medicaid Other

## 2015-04-06 VITALS — BP 104/56 | HR 84 | Wt 184.0 lb

## 2015-04-06 DIAGNOSIS — Z36 Encounter for antenatal screening of mother: Secondary | ICD-10-CM | POA: Diagnosis not present

## 2015-04-06 DIAGNOSIS — Z3492 Encounter for supervision of normal pregnancy, unspecified, second trimester: Secondary | ICD-10-CM

## 2015-04-06 DIAGNOSIS — Z1389 Encounter for screening for other disorder: Secondary | ICD-10-CM

## 2015-04-06 DIAGNOSIS — Z331 Pregnant state, incidental: Secondary | ICD-10-CM

## 2015-04-06 DIAGNOSIS — Z363 Encounter for antenatal screening for malformations: Secondary | ICD-10-CM

## 2015-04-06 NOTE — Progress Notes (Signed)
[place] 20+6wks measurement c/w dates, efw 431g,breech,ant pl gr 0,cx 4.5cm,sdp of fluid 4.4cm,fht 122bpm,anatomy complete,no obvious abn seen

## 2015-04-06 NOTE — Progress Notes (Signed)
Low-risk OB appointment G2P1001 [redacted]w[redacted]d Estimated Date of Delivery: 08/18/15 BP 104/56 mmHg  Pulse 84  Wt 184 lb (83.462 kg)  LMP 11/11/2014  BP, weight, and urine reviewed.  Refer to obstetrical flow sheet for FH & FHR.  Reports good fm.  Denies regular uc's, lof, vb, or uti s/s. No complaints. Reviewed today's normal anatomy u/s, ptl s/s, fm. Plan:  Continue routine obstetrical care  F/U in 4wks for OB appointment

## 2015-04-06 NOTE — Patient Instructions (Signed)
Second Trimester of Pregnancy The second trimester is from week 13 through week 28, months 4 through 6. The second trimester is often a time when you feel your best. Your body has also adjusted to being pregnant, and you begin to feel better physically. Usually, morning sickness has lessened or quit completely, you may have more energy, and you may have an increase in appetite. The second trimester is also a time when the fetus is growing rapidly. At the end of the sixth month, the fetus is about 9 inches long and weighs about 1 pounds. You will likely begin to feel the baby move (quickening) between 18 and 20 weeks of the pregnancy. BODY CHANGES Your body goes through many changes during pregnancy. The changes vary from woman to woman.   Your weight will continue to increase. You will notice your lower abdomen bulging out.  You may begin to get stretch marks on your hips, abdomen, and breasts.  You may develop headaches that can be relieved by medicines approved by your health care provider.  You may urinate more often because the fetus is pressing on your bladder.  You may develop or continue to have heartburn as a result of your pregnancy.  You may develop constipation because certain hormones are causing the muscles that push waste through your intestines to slow down.  You may develop hemorrhoids or swollen, bulging veins (varicose veins).  You may have back pain because of the weight gain and pregnancy hormones relaxing your joints between the bones in your pelvis and as a result of a shift in weight and the muscles that support your balance.  Your breasts will continue to grow and be tender.  Your gums may bleed and may be sensitive to brushing and flossing.  Dark spots or blotches (chloasma, mask of pregnancy) may develop on your face. This will likely fade after the baby is born.  A dark line from your belly button to the pubic area (linea nigra) may appear. This will likely fade  after the baby is born.  You may have changes in your hair. These can include thickening of your hair, rapid growth, and changes in texture. Some women also have hair loss during or after pregnancy, or hair that feels dry or thin. Your hair will most likely return to normal after your baby is born. WHAT TO EXPECT AT YOUR PRENATAL VISITS During a routine prenatal visit:  You will be weighed to make sure you and the fetus are growing normally.  Your blood pressure will be taken.  Your abdomen will be measured to track your baby's growth.  The fetal heartbeat will be listened to.  Any test results from the previous visit will be discussed. Your health care provider may ask you:  How you are feeling.  If you are feeling the baby move.  If you have had any abnormal symptoms, such as leaking fluid, bleeding, severe headaches, or abdominal cramping.  If you have any questions. Other tests that may be performed during your second trimester include:  Blood tests that check for:  Low iron levels (anemia).  Gestational diabetes (between 24 and 28 weeks).  Rh antibodies.  Urine tests to check for infections, diabetes, or protein in the urine.  An ultrasound to confirm the proper growth and development of the baby.  An amniocentesis to check for possible genetic problems.  Fetal screens for spina bifida and Down syndrome. HOME CARE INSTRUCTIONS   Avoid all smoking, herbs, alcohol, and unprescribed   drugs. These chemicals affect the formation and growth of the baby.  Follow your health care provider's instructions regarding medicine use. There are medicines that are either safe or unsafe to take during pregnancy.  Exercise only as directed by your health care provider. Experiencing uterine cramps is a good sign to stop exercising.  Continue to eat regular, healthy meals.  Wear a good support bra for breast tenderness.  Do not use hot tubs, steam rooms, or saunas.  Wear your  seat belt at all times when driving.  Avoid raw meat, uncooked cheese, cat litter boxes, and soil used by cats. These carry germs that can cause birth defects in the baby.  Take your prenatal vitamins.  Try taking a stool softener (if your health care provider approves) if you develop constipation. Eat more high-fiber foods, such as fresh vegetables or fruit and whole grains. Drink plenty of fluids to keep your urine clear or pale yellow.  Take warm sitz baths to soothe any pain or discomfort caused by hemorrhoids. Use hemorrhoid cream if your health care provider approves.  If you develop varicose veins, wear support hose. Elevate your feet for 15 minutes, 3-4 times a day. Limit salt in your diet.  Avoid heavy lifting, wear low heel shoes, and practice good posture.  Rest with your legs elevated if you have leg cramps or low back pain.  Visit your dentist if you have not gone yet during your pregnancy. Use a soft toothbrush to brush your teeth and be gentle when you floss.  A sexual relationship may be continued unless your health care provider directs you otherwise.  Continue to go to all your prenatal visits as directed by your health care provider. SEEK MEDICAL CARE IF:   You have dizziness.  You have mild pelvic cramps, pelvic pressure, or nagging pain in the abdominal area.  You have persistent nausea, vomiting, or diarrhea.  You have a bad smelling vaginal discharge.  You have pain with urination. SEEK IMMEDIATE MEDICAL CARE IF:   You have a fever.  You are leaking fluid from your vagina.  You have spotting or bleeding from your vagina.  You have severe abdominal cramping or pain.  You have rapid weight gain or loss.  You have shortness of breath with chest pain.  You notice sudden or extreme swelling of your face, hands, ankles, feet, or legs.  You have not felt your baby move in over an hour.  You have severe headaches that do not go away with  medicine.  You have vision changes. Document Released: 08/22/2001 Document Revised: 09/02/2013 Document Reviewed: 10/29/2012 [employer] Patient Information 2015 [employer]. This information is not intended to replace advice given to you by your health care provider. Make sure you discuss any questions you have with your health care provider.  

## 2015-04-07 ENCOUNTER — Encounter: Payer: Medicaid Other | Admitting: Women's Health

## 2015-04-07 ENCOUNTER — Encounter: Payer: Self-pay | Admitting: Women's Health

## 2015-05-05 ENCOUNTER — Ambulatory Visit (INDEPENDENT_AMBULATORY_CARE_PROVIDER_SITE_OTHER): Payer: Medicaid Other | Admitting: Women's Health

## 2015-05-05 ENCOUNTER — Encounter: Payer: Self-pay | Admitting: Women's Health

## 2015-05-05 VITALS — BP 114/62 | HR 92 | Wt 193.0 lb

## 2015-05-05 DIAGNOSIS — Z1389 Encounter for screening for other disorder: Secondary | ICD-10-CM

## 2015-05-05 DIAGNOSIS — Z3492 Encounter for supervision of normal pregnancy, unspecified, second trimester: Secondary | ICD-10-CM

## 2015-05-05 DIAGNOSIS — Z331 Pregnant state, incidental: Secondary | ICD-10-CM

## 2015-05-05 LAB — POCT URINALYSIS DIPSTICK
Glucose, UA: NEGATIVE
Ketones, UA: NEGATIVE
NITRITE UA: NEGATIVE
PROTEIN UA: NEGATIVE
RBC UA: NEGATIVE

## 2015-05-05 NOTE — Patient Instructions (Signed)
You will have your sugar test next visit.  Please do not eat or drink anything after midnight the night before you come, not even water.  You will be here for at least two hours.     Call the office ([phone]) or go to [facility] if:  You begin to have strong, frequent contractions  Your water breaks.  Sometimes it is a big gush of fluid, sometimes it is just a trickle that keeps getting your panties wet or running down your legs  You have vaginal bleeding.  It is normal to have a small amount of spotting if your cervix was checked.   You don't feel your baby moving like normal.  If you don't, get you something to eat and drink and lay down and focus on feeling your baby move.   If your baby is still not moving like normal, you should call the office or go to [facility].  Second Trimester of Pregnancy The second trimester is from week 13 through week 28, months 4 through 6. The second trimester is often a time when you feel your best. Your body has also adjusted to being pregnant, and you begin to feel better physically. Usually, morning sickness has lessened or quit completely, you may have more energy, and you may have an increase in appetite. The second trimester is also a time when the fetus is growing rapidly. At the end of the sixth month, the fetus is about 9 inches long and weighs about 1 pounds. You will likely begin to feel the baby move (quickening) between 18 and 20 weeks of the pregnancy. BODY CHANGES Your body goes through many changes during pregnancy. The changes vary from woman to woman.   Your weight will continue to increase. You will notice your lower abdomen bulging out.  You may begin to get stretch marks on your hips, abdomen, and breasts.  You may develop headaches that can be relieved by medicines approved by your health care provider.  You may urinate more often because the fetus is pressing on your bladder.  You may develop or continue to have  heartburn as a result of your pregnancy.  You may develop constipation because certain hormones are causing the muscles that push waste through your intestines to slow down.  You may develop hemorrhoids or swollen, bulging veins (varicose veins).  You may have back pain because of the weight gain and pregnancy hormones relaxing your joints between the bones in your pelvis and as a result of a shift in weight and the muscles that support your balance.  Your breasts will continue to grow and be tender.  Your gums may bleed and may be sensitive to brushing and flossing.  Dark spots or blotches (chloasma, mask of pregnancy) may develop on your face. This will likely fade after the baby is born.  A dark line from your belly button to the pubic area (linea nigra) may appear. This will likely fade after the baby is born.  You may have changes in your hair. These can include thickening of your hair, rapid growth, and changes in texture. Some women also have hair loss during or after pregnancy, or hair that feels dry or thin. Your hair will most likely return to normal after your baby is born. WHAT TO EXPECT AT YOUR PRENATAL VISITS During a routine prenatal visit:  You will be weighed to make sure you and the fetus are growing normally.  Your blood pressure will be taken.    Your abdomen will be measured to track your baby's growth.  The fetal heartbeat will be listened to.  Any test results from the previous visit will be discussed. Your health care provider may ask you:  How you are feeling.  If you are feeling the baby move.  If you have had any abnormal symptoms, such as leaking fluid, bleeding, severe headaches, or abdominal cramping.  If you have any questions. Other tests that may be performed during your second trimester include:  Blood tests that check for:  Low iron levels (anemia).  Gestational diabetes (between 24 and 28 weeks).  Rh antibodies.  Urine tests to check  for infections, diabetes, or protein in the urine.  An ultrasound to confirm the proper growth and development of the baby.  An amniocentesis to check for possible genetic problems.  Fetal screens for spina bifida and Down syndrome. HOME CARE INSTRUCTIONS   Avoid all smoking, herbs, alcohol, and unprescribed drugs. These chemicals affect the formation and growth of the baby.  Follow your health care provider's instructions regarding medicine use. There are medicines that are either safe or unsafe to take during pregnancy.  Exercise only as directed by your health care provider. Experiencing uterine cramps is a good sign to stop exercising.  Continue to eat regular, healthy meals.  Wear a good support bra for breast tenderness.  Do not use hot tubs, steam rooms, or saunas.  Wear your seat belt at all times when driving.  Avoid raw meat, uncooked cheese, cat litter boxes, and soil used by cats. These carry germs that can cause birth defects in the baby.  Take your prenatal vitamins.  Try taking a stool softener (if your health care provider approves) if you develop constipation. Eat more high-fiber foods, such as fresh vegetables or fruit and whole grains. Drink plenty of fluids to keep your urine clear or pale yellow.  Take warm sitz baths to soothe any pain or discomfort caused by hemorrhoids. Use hemorrhoid cream if your health care provider approves.  If you develop varicose veins, wear support hose. Elevate your feet for 15 minutes, 3-4 times a day. Limit salt in your diet.  Avoid heavy lifting, wear low heel shoes, and practice good posture.  Rest with your legs elevated if you have leg cramps or low back pain.  Visit your dentist if you have not gone yet during your pregnancy. Use a soft toothbrush to brush your teeth and be gentle when you floss.  A sexual relationship may be continued unless your health care provider directs you otherwise.  Continue to go to all your  prenatal visits as directed by your health care provider. SEEK MEDICAL CARE IF:   You have dizziness.  You have mild pelvic cramps, pelvic pressure, or nagging pain in the abdominal area.  You have persistent nausea, vomiting, or diarrhea.  You have a bad smelling vaginal discharge.  You have pain with urination. SEEK IMMEDIATE MEDICAL CARE IF:   You have a fever.  You are leaking fluid from your vagina.  You have spotting or bleeding from your vagina.  You have severe abdominal cramping or pain.  You have rapid weight gain or loss.  You have shortness of breath with chest pain.  You notice sudden or extreme swelling of your face, hands, ankles, feet, or legs.  You have not felt your baby move in over an hour.  You have severe headaches that do not go away with medicine.  You have vision changes.   Document Released: 08/22/2001 Document Revised: 09/02/2013 Document Reviewed: 10/29/2012 [employer] Patient Information 2015 [employer]. This information is not intended to replace advice given to you by your health care provider. Make sure you discuss any questions you have with your health care provider.     

## 2015-05-05 NOTE — Progress Notes (Signed)
Low-risk OB appointment G2P1001 [redacted]w[redacted]d Estimated Date of Delivery: 08/18/15 BP 114/62 mmHg  Pulse 92  Wt 193 lb (87.544 kg)  LMP 11/11/2014  BP, weight, and urine reviewed.  Refer to obstetrical flow sheet for FH & FHR.  Reports good fm.  Denies regular uc's, lof, vb, or uti s/s. Mid central back pain- no tenderness to palpation, no trigger points identified- discussed relief measures. Feet hurt when standing all day at work- wears sperry's- can try insoles or more supportive shoe.  Reviewed ptl s/s, fm. Plan:  Continue routine obstetrical care  F/U in 3wks for OB appointment and pn2

## 2015-05-25 ENCOUNTER — Other Ambulatory Visit: Payer: Self-pay | Admitting: Women's Health

## 2015-05-25 ENCOUNTER — Encounter: Payer: Self-pay | Admitting: Women's Health

## 2015-05-25 ENCOUNTER — Ambulatory Visit (INDEPENDENT_AMBULATORY_CARE_PROVIDER_SITE_OTHER): Payer: Medicaid Other

## 2015-05-25 ENCOUNTER — Other Ambulatory Visit: Payer: Medicaid Other

## 2015-05-25 ENCOUNTER — Ambulatory Visit (INDEPENDENT_AMBULATORY_CARE_PROVIDER_SITE_OTHER): Payer: Medicaid Other | Admitting: Women's Health

## 2015-05-25 VITALS — BP 98/56 | HR 68 | Wt 194.0 lb

## 2015-05-25 DIAGNOSIS — O9989 Other specified diseases and conditions complicating pregnancy, childbirth and the puerperium: Secondary | ICD-10-CM | POA: Diagnosis not present

## 2015-05-25 DIAGNOSIS — N2889 Other specified disorders of kidney and ureter: Secondary | ICD-10-CM

## 2015-05-25 DIAGNOSIS — Z131 Encounter for screening for diabetes mellitus: Secondary | ICD-10-CM

## 2015-05-25 DIAGNOSIS — Z331 Pregnant state, incidental: Secondary | ICD-10-CM

## 2015-05-25 DIAGNOSIS — Z363 Encounter for antenatal screening for malformations: Secondary | ICD-10-CM

## 2015-05-25 DIAGNOSIS — Z3493 Encounter for supervision of normal pregnancy, unspecified, third trimester: Secondary | ICD-10-CM

## 2015-05-25 DIAGNOSIS — Z1389 Encounter for screening for other disorder: Secondary | ICD-10-CM

## 2015-05-25 DIAGNOSIS — Z369 Encounter for antenatal screening, unspecified: Secondary | ICD-10-CM

## 2015-05-25 LAB — POCT URINALYSIS DIPSTICK
Glucose, UA: NEGATIVE
Ketones, UA: NEGATIVE
Leukocytes, UA: NEGATIVE
NITRITE UA: NEGATIVE
Protein, UA: NEGATIVE
RBC UA: NEGATIVE

## 2015-05-25 NOTE — Patient Instructions (Signed)

## 2015-05-25 NOTE — Progress Notes (Signed)
Low-risk OB appointment G2P1001 [redacted]w[redacted]d Estimated Date of Delivery: 08/18/15 BP 98/56 mmHg  Pulse 68  Wt 194 lb (87.998 kg)  LMP 11/11/2014  BP, weight, and urine reviewed.  Refer to obstetrical flow sheet for FH & FHR.  Reports good fm.  Denies regular uc's, lof, vb, or uti s/s. No complaints. Reviewed ptl s/s, fkc. Recommended Tdap at HD/PCP per CDC guidelines. Discussed mild Lt kidney caliectasis @ anatomy u/s w/ JVF, to repeat Plan:  Continue routine obstetrical care  F/U in asap for u/s to re-evaluate Lt fetal kidney (no visit), then 4wks for OB appointment  PN2 today

## 2015-05-25 NOTE — Progress Notes (Signed)
[place] 27+6wks,cx 4cm,cephalic,ant pl gr 1,rt renal pelvis 1.45mm,lt renal pelvis 4mm (wnl/[place]),efw 1195g 57.3%,afi 13.9cm,normal ov's bilat,fht 137bpm

## 2015-05-26 LAB — CBC
HEMOGLOBIN: 12.4 g/dL (ref 11.1–15.9)
Hematocrit: 35.1 % (ref 34.0–46.6)
MCH: 31.6 pg (ref 26.6–33.0)
MCHC: 35.3 g/dL (ref 31.5–35.7)
MCV: 89 fL (ref 79–97)
Platelets: 165 10*3/uL (ref 150–379)
RBC: 3.93 x10E6/uL (ref 3.77–5.28)
RDW: 12.9 % (ref 12.3–15.4)
WBC: 7.1 10*3/uL (ref 3.4–10.8)

## 2015-05-26 LAB — GLUCOSE TOLERANCE, 2 HOURS W/ 1HR
GLUCOSE, FASTING: 78 mg/dL (ref 65–91)
Glucose, 1 hour: 80 mg/dL (ref 65–179)
Glucose, 2 hour: 78 mg/dL (ref 65–152)

## 2015-05-26 LAB — HIV ANTIBODY (ROUTINE TESTING W REFLEX): HIV Screen 4th Generation wRfx: NONREACTIVE

## 2015-05-26 LAB — RPR: RPR Ser Ql: NONREACTIVE

## 2015-05-26 LAB — ANTIBODY SCREEN: ANTIBODY SCREEN: NEGATIVE

## 2015-05-26 LAB — HSV 2 ANTIBODY, IGG: HSV 2 GLYCOPROTEIN G AB, IGG: 8.13 {index} — AB (ref 0.00–0.90)

## 2015-06-22 ENCOUNTER — Ambulatory Visit (INDEPENDENT_AMBULATORY_CARE_PROVIDER_SITE_OTHER): Payer: Medicaid Other | Admitting: Women's Health

## 2015-06-22 ENCOUNTER — Encounter: Payer: Self-pay | Admitting: Women's Health

## 2015-06-22 VITALS — BP 110/52 | HR 96 | Wt 197.0 lb

## 2015-06-22 DIAGNOSIS — B009 Herpesviral infection, unspecified: Secondary | ICD-10-CM

## 2015-06-22 DIAGNOSIS — Z331 Pregnant state, incidental: Secondary | ICD-10-CM

## 2015-06-22 DIAGNOSIS — Z1389 Encounter for screening for other disorder: Secondary | ICD-10-CM

## 2015-06-22 DIAGNOSIS — Z3493 Encounter for supervision of normal pregnancy, unspecified, third trimester: Secondary | ICD-10-CM

## 2015-06-22 LAB — POCT URINALYSIS DIPSTICK
Blood, UA: NEGATIVE
Glucose, UA: NEGATIVE
Ketones, UA: NEGATIVE
LEUKOCYTES UA: NEGATIVE
NITRITE UA: NEGATIVE
PROTEIN UA: NEGATIVE

## 2015-06-22 MED ORDER — ACYCLOVIR 400 MG PO TABS: 400.0000 mg | ORAL_TABLET | Freq: Three times a day (TID) | ORAL | Status: DC

## 2015-06-22 NOTE — Progress Notes (Signed)
Pt denies any problems or concerns at this time.  

## 2015-06-22 NOTE — Patient Instructions (Signed)
Begin acyclovir @ 34 weeks (10/26)  Call the office [phone]) or go to [facility] if:  You begin to have strong, frequent contractions  Your water breaks.  Sometimes it is a big gush of fluid, sometimes it is just a trickle that keeps getting your panties wet or running down your legs  You have vaginal bleeding.  It is normal to have a small amount of spotting if your cervix was checked.   You don't feel your baby moving like normal.  If you don't, get you something to eat and drink and lay down and focus on feeling your baby move.  You should feel at least 10 movements in 2 hours.  If you don't, you should call the office or go to [facility].    Preterm Labor Information Preterm labor is when labor starts at less than 37 weeks of pregnancy. The normal length of a pregnancy is 39 to 41 weeks. CAUSES Often, there is no identifiable underlying cause as to why a woman goes into preterm labor. One of the most common known causes of preterm labor is infection. Infections of the uterus, cervix, vagina, amniotic sac, bladder, kidney, or even the lungs (pneumonia) can cause labor to start. Other suspected causes of preterm labor include:   Urogenital infections, such as yeast infections and bacterial vaginosis.   Uterine abnormalities (uterine shape, uterine septum, fibroids, or bleeding from the placenta).   A cervix that has been operated on (it may fail to stay closed).   Malformations in the fetus.   Multiple gestations (twins, triplets, and so on).   Breakage of the amniotic sac.  RISK FACTORS  Having a previous history of preterm labor.   Having premature rupture of membranes (PROM).   Having a placenta that covers the opening of the cervix (placenta previa).   Having a placenta that separates from the uterus (placental abruption).   Having a cervix that is too weak to hold the fetus in the uterus (incompetent cervix).   Having too much fluid in the  amniotic sac (polyhydramnios).   Taking illegal drugs or smoking while pregnant.   Not gaining enough weight while pregnant.   Being younger than 87 and older than 23 years old.   Having a low socioeconomic status.   Being African American. SYMPTOMS Signs and symptoms of preterm labor include:   Menstrual-like cramps, abdominal pain, or back pain.  Uterine contractions that are regular, as frequent as six in an hour, regardless of their intensity (may be mild or painful).  Contractions that start on the top of the uterus and spread down to the lower abdomen and back.   A sense of increased pelvic pressure.   A watery or bloody mucus discharge that comes from the vagina.  TREATMENT Depending on the length of the pregnancy and other circumstances, your health care provider may suggest bed rest. If necessary, there are medicines that can be given to stop contractions and to mature the fetal lungs. If labor happens before 34 weeks of pregnancy, a prolonged hospital stay may be recommended. Treatment depends on the condition of both you and the fetus.  WHAT SHOULD YOU DO IF YOU THINK YOU ARE IN PRETERM LABOR? Call your health care provider right away. You will need to go to the hospital to get checked immediately. HOW CAN YOU PREVENT PRETERM LABOR IN FUTURE PREGNANCIES? You should:   Stop smoking if you smoke.  Maintain healthy weight gain and avoid chemicals and drugs that  are not necessary.  Be watchful for any type of infection.  Inform your health care provider if you have a known history of preterm labor.   This information is not intended to replace advice given to you by your health care provider. Make sure you discuss any questions you have with your health care provider.   Document Released: 11/18/2003 Document Revised: 04/30/2013 Document Reviewed: 09/30/2012 [employer] Interactive Patient Education [employer]2016 [employer]

## 2015-06-22 NOTE — Progress Notes (Signed)
Low-risk OB appointment G2P1001 [redacted]w[redacted]d Estimated Date of Delivery: 08/18/15 BP 110/52 mmHg  Pulse 96  Wt 197 lb (89.359 kg)  LMP 11/11/2014  BP, weight, and urine reviewed.  Refer to obstetrical flow sheet for FH & FHR.  Reports good fm.  Denies regular uc's, lof, vb, or uti s/s. No complaints. Reviewed pn2 results, ptl s/s, fkc. 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  Plan:  Continue routine obstetrical care  F/U in 2wks for OB appointment  Recommended flu shot, pt declines today, may come back later

## 2015-07-06 ENCOUNTER — Encounter: Payer: Medicaid Other | Admitting: Women's Health

## 2015-07-06 ENCOUNTER — Encounter: Payer: Self-pay | Admitting: Women's Health

## 2015-07-06 ENCOUNTER — Other Ambulatory Visit: Payer: Self-pay | Admitting: Women's Health

## 2015-07-06 ENCOUNTER — Ambulatory Visit (INDEPENDENT_AMBULATORY_CARE_PROVIDER_SITE_OTHER): Payer: Medicaid Other | Admitting: Women's Health

## 2015-07-06 VITALS — BP 108/60 | HR 84 | Wt 200.0 lb

## 2015-07-06 DIAGNOSIS — Z331 Pregnant state, incidental: Secondary | ICD-10-CM

## 2015-07-06 DIAGNOSIS — Z1389 Encounter for screening for other disorder: Secondary | ICD-10-CM

## 2015-07-06 DIAGNOSIS — Z3493 Encounter for supervision of normal pregnancy, unspecified, third trimester: Secondary | ICD-10-CM

## 2015-07-06 DIAGNOSIS — O1213 Gestational proteinuria, third trimester: Secondary | ICD-10-CM

## 2015-07-06 LAB — POCT URINALYSIS DIPSTICK
Glucose, UA: NEGATIVE
Ketones, UA: NEGATIVE
NITRITE UA: NEGATIVE
RBC UA: NEGATIVE

## 2015-07-06 NOTE — Progress Notes (Signed)
Low-risk OB appointment G2P1001 9557w6d Estimated Date of Delivery: 08/18/15 BP 108/60 mmHg  Pulse 84  Wt 200 lb (90.719 kg)  LMP 11/11/2014  BP, weight, and urine reviewed.  Refer to obstetrical flow sheet for FH & FHR.  Reports good fm.  Denies regular uc's, lof, vb, or uti s/s. Occ pressure, more w/ urinating/bm, denies urinary frequency/urgency/dysuria- wants cx checked SVE: LTC, ballotable Reviewed ptl s/s, fkc. Plan:  Continue routine obstetrical care  F/U in 2wks for OB appointment  Will send urine cx, wants to wait til next visit for flu shot

## 2015-07-06 NOTE — Patient Instructions (Signed)
Call the office ([phone]) or go to [facility] if:  You begin to have strong, frequent contractions  Your water breaks.  Sometimes it is a big gush of fluid, sometimes it is just a trickle that keeps getting your panties wet or running down your legs  You have vaginal bleeding.  It is normal to have a small amount of spotting if your cervix was checked.   You don't feel your baby moving like normal.  If you don't, get you something to eat and drink and lay down and focus on feeling your baby move.  You should feel at least 10 movements in 2 hours.  If you don't, you should call the office or go to [facility].    Preterm Labor Information Preterm labor is when labor starts at less than 37 weeks of pregnancy. The normal length of a pregnancy is 39 to 41 weeks. CAUSES Often, there is no identifiable underlying cause as to why a woman goes into preterm labor. One of the most common known causes of preterm labor is infection. Infections of the uterus, cervix, vagina, amniotic sac, bladder, kidney, or even the lungs (pneumonia) can cause labor to start. Other suspected causes of preterm labor include:   Urogenital infections, such as yeast infections and bacterial vaginosis.   Uterine abnormalities (uterine shape, uterine septum, fibroids, or bleeding from the placenta).   A cervix that has been operated on (it may fail to stay closed).   Malformations in the fetus.   Multiple gestations (twins, triplets, and so on).   Breakage of the amniotic sac.  RISK FACTORS  Having a previous history of preterm labor.   Having premature rupture of membranes (PROM).   Having a placenta that covers the opening of the cervix (placenta previa).   Having a placenta that separates from the uterus (placental abruption).   Having a cervix that is too weak to hold the fetus in the uterus (incompetent cervix).   Having too much fluid in the amniotic sac (polyhydramnios).   Taking  illegal drugs or smoking while pregnant.   Not gaining enough weight while pregnant.   Being younger than 18 and older than 23 years old.   Having a low socioeconomic status.   Being African American. SYMPTOMS Signs and symptoms of preterm labor include:   Menstrual-like cramps, abdominal pain, or back pain.  Uterine contractions that are regular, as frequent as six in an hour, regardless of their intensity (may be mild or painful).  Contractions that start on the top of the uterus and spread down to the lower abdomen and back.   A sense of increased pelvic pressure.   A watery or bloody mucus discharge that comes from the vagina.  TREATMENT Depending on the length of the pregnancy and other circumstances, your health care provider may suggest bed rest. If necessary, there are medicines that can be given to stop contractions and to mature the fetal lungs. If labor happens before 34 weeks of pregnancy, a prolonged hospital stay may be recommended. Treatment depends on the condition of both you and the fetus.  WHAT SHOULD YOU DO IF YOU THINK YOU ARE IN PRETERM LABOR? Call your health care provider right away. You will need to go to the hospital to get checked immediately. HOW CAN YOU PREVENT PRETERM LABOR IN FUTURE PREGNANCIES? You should:   Stop smoking if you smoke.  Maintain healthy weight gain and avoid chemicals and drugs that are not necessary.  Be watchful for   any type of infection.  Inform your health care provider if you have a known history of preterm labor.   This information is not intended to replace advice given to you by your health care provider. Make sure you discuss any questions you have with your health care provider.   Document Released: 11/18/2003 Document Revised: 04/30/2013 Document Reviewed: 09/30/2012 Elsevier Interactive Patient Education 2016 Elsevier Inc.  

## 2015-07-08 LAB — URINE CULTURE

## 2015-07-20 ENCOUNTER — Ambulatory Visit (INDEPENDENT_AMBULATORY_CARE_PROVIDER_SITE_OTHER): Payer: Medicaid Other | Admitting: Women's Health

## 2015-07-20 ENCOUNTER — Encounter: Payer: Self-pay | Admitting: Women's Health

## 2015-07-20 VITALS — BP 100/54 | HR 88 | Wt 199.0 lb

## 2015-07-20 DIAGNOSIS — Z1389 Encounter for screening for other disorder: Secondary | ICD-10-CM

## 2015-07-20 DIAGNOSIS — Z331 Pregnant state, incidental: Secondary | ICD-10-CM

## 2015-07-20 DIAGNOSIS — Z3493 Encounter for supervision of normal pregnancy, unspecified, third trimester: Secondary | ICD-10-CM

## 2015-07-20 LAB — POCT URINALYSIS DIPSTICK
Glucose, UA: NEGATIVE
Ketones, UA: NEGATIVE
Nitrite, UA: NEGATIVE

## 2015-07-20 NOTE — Progress Notes (Signed)
Low-risk OB appointment G2P1001 6917w6d Estimated Date of Delivery: 08/18/15 BP 100/54 mmHg  Pulse 88  Wt 199 lb (90.266 kg)  LMP 11/11/2014  BP, weight, and urine reviewed.  Refer to obstetrical flow sheet for FH & FHR.  Reports good fm.  Denies regular uc's, lof, vb, or uti s/s. No complaints. Had said at last visit she wanted flu shot this visit, declines today- states she has to work tomorrow Reviewed ptl s/s, fkc. Plan:  Continue routine obstetrical care  F/U in 1wk for OB appointment and gbs

## 2015-07-28 ENCOUNTER — Ambulatory Visit (INDEPENDENT_AMBULATORY_CARE_PROVIDER_SITE_OTHER): Payer: Medicaid Other | Admitting: Women's Health

## 2015-07-28 ENCOUNTER — Encounter: Payer: Self-pay | Admitting: Women's Health

## 2015-07-28 VITALS — BP 110/60 | HR 76 | Wt 201.0 lb

## 2015-07-28 DIAGNOSIS — Z1389 Encounter for screening for other disorder: Secondary | ICD-10-CM

## 2015-07-28 DIAGNOSIS — Z3493 Encounter for supervision of normal pregnancy, unspecified, third trimester: Secondary | ICD-10-CM

## 2015-07-28 DIAGNOSIS — Z3685 Encounter for antenatal screening for Streptococcus B: Secondary | ICD-10-CM

## 2015-07-28 DIAGNOSIS — Z331 Pregnant state, incidental: Secondary | ICD-10-CM

## 2015-07-28 DIAGNOSIS — Z118 Encounter for screening for other infectious and parasitic diseases: Secondary | ICD-10-CM

## 2015-07-28 DIAGNOSIS — Z1159 Encounter for screening for other viral diseases: Secondary | ICD-10-CM

## 2015-07-28 LAB — POCT URINALYSIS DIPSTICK
Glucose, UA: NEGATIVE
Ketones, UA: NEGATIVE
LEUKOCYTES UA: NEGATIVE
Nitrite, UA: NEGATIVE
Protein, UA: 1
RBC UA: NEGATIVE

## 2015-07-28 LAB — OB RESULTS CONSOLE GBS: STREP GROUP B AG: POSITIVE

## 2015-07-28 NOTE — Patient Instructions (Signed)
Call the office ([phone]) or go to [facility] if:  You begin to have strong, frequent contractions  Your water breaks.  Sometimes it is a big gush of fluid, sometimes it is just a trickle that keeps getting your panties wet or running down your legs  You have vaginal bleeding.  It is normal to have a small amount of spotting if your cervix was checked.   You don't feel your baby moving like normal.  If you don't, get you something to eat and drink and lay down and focus on feeling your baby move.  You should feel at least 10 movements in 2 hours.  If you don't, you should call the office or go to [facility].    Braxton Hicks Contractions Contractions of the uterus can occur throughout pregnancy. Contractions are not always a sign that you are in labor.  WHAT ARE BRAXTON HICKS CONTRACTIONS?  Contractions that occur before labor are called Braxton Hicks contractions, or false labor. Toward the end of pregnancy (32-34 weeks), these contractions can develop more often and may become more forceful. This is not true labor because these contractions do not result in opening (dilatation) and thinning of the cervix. They are sometimes difficult to tell apart from true labor because these contractions can be forceful and people have different pain tolerances. You should not feel embarrassed if you go to the hospital with false labor. Sometimes, the only way to tell if you are in true labor is for your health care provider to look for changes in the cervix. If there are no prenatal problems or other health problems associated with the pregnancy, it is completely safe to be sent home with false labor and await the onset of true labor. HOW CAN YOU TELL THE DIFFERENCE BETWEEN TRUE AND FALSE LABOR? False Labor  The contractions of false labor are usually shorter and not as hard as those of true labor.   The contractions are usually irregular.   The contractions are often felt in the front of  the lower abdomen and in the groin.   The contractions may go away when you walk around or change positions while lying down.   The contractions get weaker and are shorter lasting as time goes on.   The contractions do not usually become progressively stronger, regular, and closer together as with true labor.  True Labor  Contractions in true labor last 30-70 seconds, become very regular, usually become more intense, and increase in frequency.   The contractions do not go away with walking.   The discomfort is usually felt in the top of the uterus and spreads to the lower abdomen and low back.   True labor can be determined by your health care provider with an exam. This will show that the cervix is dilating and getting thinner.  WHAT TO REMEMBER  Keep up with your usual exercises and follow other instructions given by your health care provider.   Take medicines as directed by your health care provider.   Keep your regular prenatal appointments.   Eat and drink lightly if you think you are going into labor.   If Braxton Hicks contractions are making you uncomfortable:   Change your position from lying down or resting to walking, or from walking to resting.   Sit and rest in a tub of warm water.   Drink 2-3 glasses of water. Dehydration may cause these contractions.   Do slow and deep breathing several times an hour.    WHEN SHOULD I SEEK IMMEDIATE MEDICAL CARE? Seek immediate medical care if:  Your contractions become stronger, more regular, and closer together.   You have fluid leaking or gushing from your vagina.   You have a fever.   You pass blood-tinged mucus.   You have vaginal bleeding.   You have continuous abdominal pain.   You have low back pain that you never had before.   You feel your baby's head pushing down and causing pelvic pressure.   Your baby is not moving as much as it used to.    This information is not intended to  replace advice given to you by your health care provider. Make sure you discuss any questions you have with your health care provider.   Document Released: 08/28/2005 Document Revised: 09/02/2013 Document Reviewed: 06/09/2013 Elsevier Interactive Patient Education 2016 Elsevier Inc.  

## 2015-07-28 NOTE — Progress Notes (Signed)
Low-risk OB appointment G2P1001 3642w0d Estimated Date of Delivery: 08/18/15 BP 110/60 mmHg  Pulse 76  Wt 201 lb (91.173 kg)  LMP 11/11/2014  BP, weight, and urine reviewed.  Refer to obstetrical flow sheet for FH & FHR.  Reports good fm.  Denies regular uc's, lof, vb, or uti s/s. No complaints. GBS collected SVE per request: 3/th/-2, vtx, posterior Reviewed labor s/s, fkc. Plan:  Continue routine obstetrical care  F/U in 1wk for OB appointment

## 2015-07-29 LAB — GC/CHLAMYDIA PROBE AMP
Chlamydia trachomatis, NAA: NEGATIVE
NEISSERIA GONORRHOEAE BY PCR: NEGATIVE

## 2015-08-01 LAB — CULTURE, BETA STREP (GROUP B ONLY): Strep Gp B Culture: POSITIVE — AB

## 2015-08-03 ENCOUNTER — Encounter: Payer: Self-pay | Admitting: Obstetrics & Gynecology

## 2015-08-03 ENCOUNTER — Ambulatory Visit (INDEPENDENT_AMBULATORY_CARE_PROVIDER_SITE_OTHER): Payer: Medicaid Other | Admitting: Obstetrics & Gynecology

## 2015-08-03 ENCOUNTER — Encounter: Payer: Medicaid Other | Admitting: Advanced Practice Midwife

## 2015-08-03 VITALS — BP 100/60 | HR 88 | Wt 201.0 lb

## 2015-08-03 DIAGNOSIS — Z23 Encounter for immunization: Secondary | ICD-10-CM

## 2015-08-03 DIAGNOSIS — Z3493 Encounter for supervision of normal pregnancy, unspecified, third trimester: Secondary | ICD-10-CM

## 2015-08-03 DIAGNOSIS — Z1389 Encounter for screening for other disorder: Secondary | ICD-10-CM

## 2015-08-03 DIAGNOSIS — Z331 Pregnant state, incidental: Secondary | ICD-10-CM

## 2015-08-03 LAB — POCT URINALYSIS DIPSTICK
Glucose, UA: NEGATIVE
Ketones, UA: NEGATIVE
NITRITE UA: NEGATIVE
Protein, UA: 2
RBC UA: 1

## 2015-08-03 NOTE — Progress Notes (Signed)
G2P1001 623w6d Estimated Date of Delivery: 08/18/15  Blood pressure 100/60, pulse 88, weight 201 lb (91.173 kg), last menstrual period 11/11/2014.   BP weight and urine results all reviewed and noted.  Please refer to the obstetrical flow sheet for the fundal height and fetal heart rate documentation:  Patient reports good fetal movement, denies any bleeding and no rupture of membranes symptoms or regular contractions. Patient is without complaints. All questions were answered.  Orders Placed This Encounter  Procedures  . POCT urinalysis dipstick    Plan:  Continued routine obstetrical care,   Return in about 1 week (around 08/10/2015).

## 2015-08-04 ENCOUNTER — Telehealth: Payer: Self-pay | Admitting: Women's Health

## 2015-08-04 NOTE — Telephone Encounter (Signed)
Spoke with pt. Pt is due in 2 weeks and needs a note  for housing stating she has come out of work. Pt wants to pick up on Monday. Thanks!! JSY

## 2015-08-09 ENCOUNTER — Telehealth: Payer: Self-pay | Admitting: Women's Health

## 2015-08-09 NOTE — Telephone Encounter (Signed)
Pt states she is needed to be out of work due to being near her due date. Informed pt to ask her employer was she eligible for FMLA and have them send us her forms to be completed. Pt verbalized understanding.

## 2015-08-11 ENCOUNTER — Ambulatory Visit (INDEPENDENT_AMBULATORY_CARE_PROVIDER_SITE_OTHER): Payer: Medicaid Other | Admitting: Obstetrics & Gynecology

## 2015-08-11 ENCOUNTER — Encounter (HOSPITAL_COMMUNITY): Payer: Self-pay | Admitting: *Deleted

## 2015-08-11 ENCOUNTER — Inpatient Hospital Stay (HOSPITAL_COMMUNITY)
Admission: AD | Admit: 2015-08-11 | Discharge: 2015-08-11 | Disposition: A | Discharge status: Home / Self Care | Payer: Medicaid Other | Source: Ambulatory Visit | Attending: Obstetrics & Gynecology | Admitting: Obstetrics & Gynecology

## 2015-08-11 ENCOUNTER — Encounter: Payer: Self-pay | Admitting: Obstetrics & Gynecology

## 2015-08-11 VITALS — BP 120/60 | HR 88 | Wt 203.0 lb

## 2015-08-11 DIAGNOSIS — O26853 Spotting complicating pregnancy, third trimester: Secondary | ICD-10-CM | POA: Diagnosis not present

## 2015-08-11 DIAGNOSIS — Z3A39 39 weeks gestation of pregnancy: Secondary | ICD-10-CM | POA: Diagnosis not present

## 2015-08-11 DIAGNOSIS — O99820 Streptococcus B carrier state complicating pregnancy: Secondary | ICD-10-CM | POA: Insufficient documentation

## 2015-08-11 DIAGNOSIS — Z3483 Encounter for supervision of other normal pregnancy, third trimester: Secondary | ICD-10-CM | POA: Diagnosis not present

## 2015-08-11 DIAGNOSIS — Z331 Pregnant state, incidental: Secondary | ICD-10-CM

## 2015-08-11 DIAGNOSIS — Z1389 Encounter for screening for other disorder: Secondary | ICD-10-CM

## 2015-08-11 LAB — POCT URINALYSIS DIPSTICK
Blood, UA: 3
GLUCOSE UA: NEGATIVE
KETONES UA: NEGATIVE
Leukocytes, UA: NEGATIVE
NITRITE UA: NEGATIVE
Protein, UA: 2

## 2015-08-11 NOTE — MAU Provider Note (Signed)
  History     CSN: [id number]  Arrival date and time: 08/11/15 2054   None     Chief Complaint  Patient presents with  . Labor Eval   HPI Amanda Perez is a 24yo G2P1001 @ 39.0wks who presents for eval of spotting today after having membranes swept today in the office. Denies reg ctx or leaking fluid. Reports +FM. 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 Other disorder of menstruation and other abnormal bleeding from female genital tract 07/10/2013    Bleeding daily since IUD inserted  . HSV-2 (herpes simplex virus 2) infection   . Pregnant 12/29/2014    Past Surgical History  Procedure Laterality Date  . No past surgeries      Family History  Problem Relation Age of Onset  . Hypertension Mother   . Seizures Mother   . Hypertension Paternal Grandmother   . Hypertension Maternal Grandmother     Social History  Substance Use Topics  . Smoking status: Never Smoker   . Smokeless tobacco: Never Used  . Alcohol Use: No    Allergies: No Known Allergies  Prescriptions prior to admission  Medication Sig Dispense Refill Last Dose  . acyclovir (ZOVIRAX) 400 MG tablet Take 1 tablet (400 mg total) by mouth 3 (three) times daily. 90 tablet 3 08/10/2015 at Unknown time  . prenatal vitamin w/FE, FA (PRENATAL 1 + 1) 27-1 MG TABS tablet Take 1 tablet by mouth daily at 12 noon. 30 each 11 08/10/2015 at Unknown time  . triamcinolone ointment (KENALOG) 0.1 % Apply 1 application topically daily as needed.   08/10/2015 at Unknown time    ROS Physical Exam   Blood pressure 114/62, pulse 103, temperature 98.5 F (36.9 C), temperature source Oral, resp. rate 18, height 5' 3.5" (1.613 m), weight 92.08 kg (203 lb), last menstrual period  11/11/2014, SpO2 98 %.  Physical Exam  Constitutional: She is oriented to person, place, and time. She appears well-developed.  HENT:  Head: Normocephalic.  Neck: Normal range of motion.  Cardiovascular:  Sl tachycardic 103  Respiratory: Effort normal.  GI:  EFM 130s, +accels, no decels Irreg ctx q 3-8 mins, mild  Genitourinary:  Cx very post/3/thick, sm pink mucousy d/c in glove; no active BRB  Musculoskeletal: Normal range of motion.  Neurological: She is alert and oriented to person, place, and time.  Skin: Skin is warm and dry.  Psychiatric: She has a normal mood and affect. Her behavior is normal. Thought content normal.    MAU Course  Procedures  MDM NST read Cx exam  Assessment and Plan  IUP@term  Spotting after membranes swept  D/C home w/ labor/ROM/bldg precautions [facility] or sooner prn  Cam HaiSHAW, Amanda Perez CNM 08/11/2015, 10:29 PM

## 2015-08-11 NOTE — Telephone Encounter (Signed)
Go ahead and give her the note she needs

## 2015-08-11 NOTE — MAU Note (Signed)
Pt states that she had membranes stripped today-had some bright red bleeding-wearing pad. Did not soak pad. +FM. Having irregular contractions.

## 2015-08-11 NOTE — Progress Notes (Signed)
G2P1001 [redacted]w[redacted]d Estimated Date of Delivery: 08/18/15  Blood pressure 120/60, pulse 88, weight 203 lb (92.08 kg), last menstrual period 11/11/2014.   BP weight and urine results all reviewed and noted.  Please refer to the obstetrical flow sheet for the fundal height and fetal heart rate documentation:  Patient reports good fetal movement, denies any bleeding and no rupture of membranes symptoms or regular contractions. Patient is without complaints. All questions were answered.  Orders Placed This Encounter  Procedures  . POCT urinalysis dipstick    Plan:  Continued routine obstetrical care,  Return in about 1 week (around 08/18/2015) for LROB.

## 2015-08-11 NOTE — Discharge Instructions (Signed)
Vaginal Bleeding During Pregnancy, Third Trimester °A small amount of bleeding (spotting) from the vagina is relatively common in pregnancy. Various things can cause bleeding or spotting in pregnancy. Sometimes the bleeding is normal and is not a problem. However, bleeding during the third trimester can also be a sign of something serious for the mother and the baby. Be sure to tell your health care provider about any vaginal bleeding right away.  °Some possible causes of vaginal bleeding during the third trimester include:  °· The placenta may be partially or completely covering the opening to the cervix (placenta previa).   °· The placenta may have separated from the uterus (abruption of the placenta).   °· There may be an infection or growth on the cervix.   °· You may be starting labor, called discharging of the mucus plug.   °· The placenta may grow into the muscle layer of the uterus (placenta accreta).   °HOME CARE INSTRUCTIONS  °Watch your condition for any changes. The following actions may help to lessen any discomfort you are feeling:  °· Follow your health care provider's instructions for limiting your activity. If your health care provider orders bed rest, you may need to stay in bed and only get up to use the bathroom. However, your health care provider may allow you to continue light activity. °· If needed, make plans for someone to help with your regular activities and responsibilities while you are on bed rest. °· Keep track of the number of pads you use each day, how often you change pads, and how soaked (saturated) they are. Write this down. °· Do not use tampons. Do not douche. °· Do not have sexual intercourse or orgasms until approved by your health care provider. °· Follow your health care provider's advice about lifting, driving, and physical activities. °· If you pass any tissue from your vagina, save the tissue so you can show it to your health care provider.   °· Only take over-the-counter  or prescription medicines as directed by your health care provider. °· Do not take aspirin because it can make you bleed.   °· Keep all follow-up appointments as directed by your health care provider. °SEEK MEDICAL CARE IF: °· You have any vaginal bleeding during any part of your pregnancy. °· You have cramps or labor pains. °· You have a fever, not controlled by medicine. °SEEK IMMEDIATE MEDICAL CARE IF:  °· You have severe cramps or pain in your back or belly (abdomen). °· You have chills. °· You have a gush of fluid from the vagina. °· You pass large clots or tissue from your vagina. °· Your bleeding increases. °· You feel light-headed or weak. °· You pass out. °· You feel less movement or no movement of the baby.   °MAKE SURE YOU: °· Understand these instructions. °· Will watch your condition. °· Will get help right away if you are not doing well or get worse. °  °This information is not intended to replace advice given to you by your health care provider. Make sure you discuss any questions you have with your health care provider. °  °Document Released: 11/18/2002 Document Revised: 09/02/2013 Document Reviewed: 05/05/2013 °Elsevier Interactive Patient Education ©2016 Elsevier Inc. ° °

## 2015-08-11 NOTE — Telephone Encounter (Signed)
Note written and will have Dr. Despina HiddenEure sign at pt's appt this afternoon. JSY

## 2015-08-15 ENCOUNTER — Inpatient Hospital Stay (HOSPITAL_COMMUNITY): Payer: Medicaid Other | Admitting: Anesthesiology

## 2015-08-15 ENCOUNTER — Encounter (HOSPITAL_COMMUNITY): Payer: Self-pay | Admitting: *Deleted

## 2015-08-15 ENCOUNTER — Encounter (HOSPITAL_COMMUNITY)
Admission: AD | Disposition: A | Discharge status: Home / Self Care | Payer: Self-pay | Source: Ambulatory Visit | Attending: Obstetrics & Gynecology

## 2015-08-15 ENCOUNTER — Inpatient Hospital Stay (HOSPITAL_COMMUNITY)
Admission: AD | Admit: 2015-08-15 | Discharge: 2015-08-17 | DRG: 768 | Disposition: A | Discharge status: Home / Self Care | Payer: Medicaid Other | Source: Ambulatory Visit | Attending: Obstetrics & Gynecology | Admitting: Obstetrics & Gynecology

## 2015-08-15 DIAGNOSIS — Z3493 Encounter for supervision of normal pregnancy, unspecified, third trimester: Secondary | ICD-10-CM

## 2015-08-15 DIAGNOSIS — IMO0001 Reserved for inherently not codable concepts without codable children: Secondary | ICD-10-CM

## 2015-08-15 DIAGNOSIS — Z3A39 39 weeks gestation of pregnancy: Secondary | ICD-10-CM

## 2015-08-15 DIAGNOSIS — O98811 Other maternal infectious and parasitic diseases complicating pregnancy, first trimester: Secondary | ICD-10-CM

## 2015-08-15 DIAGNOSIS — O99824 Streptococcus B carrier state complicating childbirth: Secondary | ICD-10-CM | POA: Diagnosis present

## 2015-08-15 DIAGNOSIS — Z8249 Family history of ischemic heart disease and other diseases of the circulatory system: Secondary | ICD-10-CM | POA: Diagnosis not present

## 2015-08-15 DIAGNOSIS — Z820 Family history of epilepsy and other diseases of the nervous system: Secondary | ICD-10-CM | POA: Diagnosis not present

## 2015-08-15 DIAGNOSIS — Z79899 Other long term (current) drug therapy: Secondary | ICD-10-CM

## 2015-08-15 DIAGNOSIS — A749 Chlamydial infection, unspecified: Secondary | ICD-10-CM | POA: Diagnosis present

## 2015-08-15 DIAGNOSIS — O9882 Other maternal infectious and parasitic diseases complicating childbirth: Secondary | ICD-10-CM | POA: Diagnosis present

## 2015-08-15 DIAGNOSIS — Z3490 Encounter for supervision of normal pregnancy, unspecified, unspecified trimester: Secondary | ICD-10-CM

## 2015-08-15 DIAGNOSIS — B009 Herpesviral infection, unspecified: Secondary | ICD-10-CM | POA: Diagnosis not present

## 2015-08-15 HISTORY — PX: DILATION AND EVACUATION: SHX1459

## 2015-08-15 LAB — BLOOD GAS, ARTERIAL
ACID-BASE DEFICIT: 7 mmol/L — AB (ref 0.0–2.0)
ACID-BASE DEFICIT: 1.3 mmol/L (ref 0.0–2.0)
BICARBONATE: 21.2 meq/L (ref 20.0–24.0)
Bicarbonate: 18.2 mEq/L — ABNORMAL LOW (ref 20.0–24.0)
DRAWN BY: 291651
Drawn by: 143
FIO2: 0.57
FIO2: 0.21
O2 SAT: 95 %
O2 Saturation: 98 %
PCO2 ART: 29.5 mmHg — AB (ref 35.0–45.0)
PEEP/CPAP: 0 cmH2O
PH ART: 7.307 — AB (ref 7.350–7.450)
PH ART: 7.469 — AB (ref 7.350–7.450)
PO2 ART: 81.7 mmHg (ref 80.0–100.0)
RATE: 10 resp/min
TCO2: 19.3 mmol/L (ref 0–100)
TCO2: 22.1 mmol/L (ref 0–100)
VT: 735 mL
pCO2 arterial: 37.4 mmHg (ref 35.0–45.0)
pO2, Arterial: 160 mmHg — ABNORMAL HIGH (ref 80.0–100.0)

## 2015-08-15 LAB — MASSIVE TRANSFUSION PROTOCOL ORDER (BLOOD BANK NOTIFICATION)

## 2015-08-15 LAB — DIC (DISSEMINATED INTRAVASCULAR COAGULATION)PANEL
D-Dimer, Quant: >20 ug/mL-FEU — ABNORMAL HIGH (ref 0.00–0.50)
Fibrinogen: 251 mg/dL (ref 204–475)
Platelets: 203 10*3/uL (ref 150–400)
Smear Review: NONE SEEN
aPTT: 31 seconds (ref 24–37)

## 2015-08-15 LAB — CBC WITH DIFFERENTIAL/PLATELET
BASOS ABS: 0 10*3/uL (ref 0.0–0.1)
BASOS PCT: 0 %
Eosinophils Absolute: 0 10*3/uL (ref 0.0–0.7)
Eosinophils Relative: 0 %
HEMATOCRIT: 33.4 % — AB (ref 36.0–46.0)
HEMOGLOBIN: 11.7 g/dL — AB (ref 12.0–15.0)
LYMPHS PCT: 6 %
Lymphs Abs: 1.4 10*3/uL (ref 0.7–4.0)
MCH: 30.7 pg (ref 26.0–34.0)
MCHC: 35 g/dL (ref 30.0–36.0)
MCV: 87.7 fL (ref 78.0–100.0)
MONO ABS: 1.5 10*3/uL — AB (ref 0.1–1.0)
MONOS PCT: 7 %
NEUTROS ABS: 19.5 10*3/uL — AB (ref 1.7–7.7)
NEUTROS PCT: 87 %
Platelets: 163 10*3/uL (ref 150–400)
RBC: 3.81 MIL/uL — ABNORMAL LOW (ref 3.87–5.11)
RDW: 13.7 % (ref 11.5–15.5)
WBC: 22.3 10*3/uL — ABNORMAL HIGH (ref 4.0–10.5)

## 2015-08-15 LAB — CBC
HCT: 34.3 % — ABNORMAL LOW (ref 36.0–46.0)
HCT: 31.2 % — ABNORMAL LOW (ref 36.0–46.0)
HEMATOCRIT: 32.2 % — AB (ref 36.0–46.0)
HEMOGLOBIN: 11.7 g/dL — AB (ref 12.0–15.0)
HEMOGLOBIN: 10.8 g/dL — AB (ref 12.0–15.0)
Hemoglobin: 10.8 g/dL — ABNORMAL LOW (ref 12.0–15.0)
MCH: 30.7 pg (ref 26.0–34.0)
MCH: 30.3 pg (ref 26.0–34.0)
MCH: 30.3 pg (ref 26.0–34.0)
MCHC: 34.1 g/dL (ref 30.0–36.0)
MCHC: 33.5 g/dL (ref 30.0–36.0)
MCHC: 34.6 g/dL (ref 30.0–36.0)
MCV: 90 fL (ref 78.0–100.0)
MCV: 90.2 fL (ref 78.0–100.0)
MCV: 87.6 fL (ref 78.0–100.0)
PLATELETS: 178 10*3/uL (ref 150–400)
Platelets: 195 10*3/uL (ref 150–400)
Platelets: 160 10*3/uL (ref 150–400)
RBC: 3.81 MIL/uL — AB (ref 3.87–5.11)
RBC: 3.57 MIL/uL — AB (ref 3.87–5.11)
RBC: 3.56 MIL/uL — AB (ref 3.87–5.11)
RDW: 13.9 % (ref 11.5–15.5)
RDW: 13.6 % (ref 11.5–15.5)
RDW: 13.6 % (ref 11.5–15.5)
WBC: 12.2 10*3/uL — ABNORMAL HIGH (ref 4.0–10.5)
WBC: 18.3 10*3/uL — AB (ref 4.0–10.5)
WBC: 22.9 10*3/uL — ABNORMAL HIGH (ref 4.0–10.5)

## 2015-08-15 LAB — MRSA PCR SCREENING: MRSA BY PCR: NEGATIVE

## 2015-08-15 LAB — PREPARE RBC (CROSSMATCH)

## 2015-08-15 LAB — RPR: RPR: NONREACTIVE

## 2015-08-15 LAB — DIC (DISSEMINATED INTRAVASCULAR COAGULATION) PANEL
INR: 1.27 (ref 0.00–1.49)
PROTHROMBIN TIME: 16.1 s — AB (ref 11.6–15.2)

## 2015-08-15 SURGERY — DILATION AND EVACUATION, UTERUS
Anesthesia: General | Site: Cervix

## 2015-08-15 MED ORDER — PHENYLEPHRINE HCL 10 MG/ML IJ SOLN
20.0000 mg | INTRAMUSCULAR | Status: DC | PRN
  Administered 2015-08-15: 60 ug via INTRAVENOUS

## 2015-08-15 MED ORDER — LACTATED RINGERS IV SOLN: 500.0000 mL | INTRAVENOUS | Status: DC | PRN

## 2015-08-15 MED ORDER — ONDANSETRON HCL 4 MG/2ML IJ SOLN: 4.0000 mg | Freq: Four times a day (QID) | INTRAMUSCULAR | Status: DC | PRN

## 2015-08-15 MED ORDER — PROPOFOL 10 MG/ML IV BOLUS
INTRAVENOUS | Status: DC | PRN
  Administered 2015-08-15: 180 mg via INTRAVENOUS

## 2015-08-15 MED ORDER — MISOPROSTOL 200 MCG PO TABS
ORAL_TABLET | ORAL | Status: AC
  Filled 2015-08-15: qty 5

## 2015-08-15 MED ORDER — IBUPROFEN 600 MG PO TABS
600.0000 mg | ORAL_TABLET | Freq: Four times a day (QID) | ORAL | Status: DC
  Administered 2015-08-15 – 2015-08-17 (×7): 600 mg via ORAL
  Filled 2015-08-15 (×7): qty 1

## 2015-08-15 MED ORDER — ONDANSETRON HCL 4 MG PO TABS
4.0000 mg | ORAL_TABLET | ORAL | Status: DC | PRN
Start: 2015-08-15 — End: 2015-08-17

## 2015-08-15 MED ORDER — LACTATED RINGERS IV SOLN
INTRAVENOUS | Status: DC | PRN
  Administered 2015-08-15: 11:00:00 via INTRAVENOUS

## 2015-08-15 MED ORDER — METHYLERGONOVINE MALEATE 0.2 MG/ML IJ SOLN
INTRAMUSCULAR | Status: AC
  Administered 2015-08-15: 0.2 mg
  Filled 2015-08-15: qty 1

## 2015-08-15 MED ORDER — FLEET ENEMA 7-19 GM/118ML RE ENEM: 1.0000 | ENEMA | RECTAL | Status: DC | PRN

## 2015-08-15 MED ORDER — ONDANSETRON HCL 4 MG/2ML IJ SOLN: 4.0000 mg | INTRAMUSCULAR | Status: DC | PRN

## 2015-08-15 MED ORDER — DIPHENHYDRAMINE HCL 50 MG/ML IJ SOLN: 12.5000 mg | INTRAMUSCULAR | Status: DC | PRN

## 2015-08-15 MED ORDER — LACTATED RINGERS IV SOLN
INTRAVENOUS | Status: DC
  Administered 2015-08-15 – 2015-08-16 (×4): via INTRAVENOUS

## 2015-08-15 MED ORDER — FENTANYL CITRATE (PF) 100 MCG/2ML IJ SOLN: 25.0000 ug | INTRAMUSCULAR | Status: DC | PRN

## 2015-08-15 MED ORDER — OXYTOCIN 40 UNITS IN LACTATED RINGERS INFUSION - SIMPLE MED: 62.5000 mL/h | INTRAVENOUS | Status: DC | PRN

## 2015-08-15 MED ORDER — FENTANYL CITRATE (PF) 100 MCG/2ML IJ SOLN
INTRAMUSCULAR | Status: AC
  Filled 2015-08-15: qty 2

## 2015-08-15 MED ORDER — PROMETHAZINE HCL 25 MG/ML IJ SOLN: 6.2500 mg | INTRAMUSCULAR | Status: DC | PRN

## 2015-08-15 MED ORDER — SODIUM CHLORIDE 0.9 % IV SOLN: Freq: Once | INTRAVENOUS | Status: DC

## 2015-08-15 MED ORDER — ZOLPIDEM TARTRATE 5 MG PO TABS: 5.0000 mg | ORAL_TABLET | Freq: Every evening | ORAL | Status: DC | PRN

## 2015-08-15 MED ORDER — SENNOSIDES-DOCUSATE SODIUM 8.6-50 MG PO TABS
2.0000 | ORAL_TABLET | ORAL | Status: DC
  Administered 2015-08-16 (×2): 2 via ORAL
  Filled 2015-08-15: qty 2

## 2015-08-15 MED ORDER — SIMETHICONE 80 MG PO CHEW: 80.0000 mg | CHEWABLE_TABLET | ORAL | Status: DC | PRN

## 2015-08-15 MED ORDER — CARBOPROST TROMETHAMINE 250 MCG/ML IM SOLN: 250.0000 ug | Freq: Once | INTRAMUSCULAR | Status: DC

## 2015-08-15 MED ORDER — HYDROMORPHONE HCL 1 MG/ML IJ SOLN
2.0000 mg | Freq: Once | INTRAMUSCULAR | Status: AC
  Administered 2015-08-15: 2 mg via INTRAVENOUS

## 2015-08-15 MED ORDER — PIPERACILLIN-TAZOBACTAM 3.375 G IVPB 30 MIN
3.3750 g | Freq: Once | INTRAVENOUS | Status: AC
  Administered 2015-08-15: 3.375 g via INTRAVENOUS
  Filled 2015-08-15: qty 50

## 2015-08-15 MED ORDER — GLYCOPYRROLATE 0.2 MG/ML IJ SOLN
INTRAMUSCULAR | Status: DC | PRN
  Administered 2015-08-15: 0.2 mg via INTRAVENOUS

## 2015-08-15 MED ORDER — CARBOPROST TROMETHAMINE 250 MCG/ML IM SOLN
INTRAMUSCULAR | Status: AC
  Administered 2015-08-15: 250 ug
  Filled 2015-08-15: qty 1

## 2015-08-15 MED ORDER — METHYLERGONOVINE MALEATE 0.2 MG/ML IJ SOLN
INTRAMUSCULAR | Status: DC | PRN
  Administered 2015-08-15: 0.2 mg via INTRAMUSCULAR

## 2015-08-15 MED ORDER — DIPHENHYDRAMINE HCL 25 MG PO CAPS: 25.0000 mg | ORAL_CAPSULE | Freq: Four times a day (QID) | ORAL | Status: DC | PRN

## 2015-08-15 MED ORDER — DEXAMETHASONE SODIUM PHOSPHATE 4 MG/ML IJ SOLN
INTRAMUSCULAR | Status: AC
  Filled 2015-08-15: qty 1

## 2015-08-15 MED ORDER — BENZOCAINE-MENTHOL 20-0.5 % EX AERO: 1.0000 "application " | INHALATION_SPRAY | CUTANEOUS | Status: DC | PRN

## 2015-08-15 MED ORDER — DEXAMETHASONE SODIUM PHOSPHATE 10 MG/ML IJ SOLN
INTRAMUSCULAR | Status: DC | PRN
  Administered 2015-08-15: 4 mg via INTRAVENOUS

## 2015-08-15 MED ORDER — MEPERIDINE HCL 25 MG/ML IJ SOLN: 6.2500 mg | INTRAMUSCULAR | Status: DC | PRN

## 2015-08-15 MED ORDER — PROPOFOL 10 MG/ML IV BOLUS
INTRAVENOUS | Status: AC
  Filled 2015-08-15: qty 20

## 2015-08-15 MED ORDER — HYDROMORPHONE HCL 1 MG/ML IJ SOLN
INTRAMUSCULAR | Status: AC
  Administered 2015-08-15: 2 mg via INTRAVENOUS
  Filled 2015-08-15: qty 2

## 2015-08-15 MED ORDER — LIDOCAINE HCL (CARDIAC) 20 MG/ML IV SOLN
INTRAVENOUS | Status: DC | PRN
  Administered 2015-08-15: 80 mg via INTRAVENOUS

## 2015-08-15 MED ORDER — WITCH HAZEL-GLYCERIN EX PADS: 1.0000 "application " | MEDICATED_PAD | CUTANEOUS | Status: DC | PRN

## 2015-08-15 MED ORDER — MEASLES, MUMPS & RUBELLA VAC ~~LOC~~ INJ
0.5000 mL | INJECTION | Freq: Once | SUBCUTANEOUS | Status: DC
  Filled 2015-08-15: qty 0.5

## 2015-08-15 MED ORDER — IBUPROFEN 600 MG PO TABS
600.0000 mg | ORAL_TABLET | Freq: Four times a day (QID) | ORAL | Status: DC | PRN
  Administered 2015-08-15: 600 mg via ORAL
  Filled 2015-08-15: qty 1

## 2015-08-15 MED ORDER — OXYCODONE-ACETAMINOPHEN 5-325 MG PO TABS
1.0000 | ORAL_TABLET | ORAL | Status: DC | PRN
  Administered 2015-08-15: 1 via ORAL
  Filled 2015-08-15: qty 1

## 2015-08-15 MED ORDER — EPHEDRINE 5 MG/ML INJ: 10.0000 mg | INTRAVENOUS | Status: DC | PRN

## 2015-08-15 MED ORDER — PRENATAL MULTIVITAMIN CH
1.0000 | ORAL_TABLET | Freq: Every day | ORAL | Status: DC
  Administered 2015-08-16: 1 via ORAL
  Filled 2015-08-15: qty 1

## 2015-08-15 MED ORDER — MIDAZOLAM HCL 2 MG/2ML IJ SOLN
INTRAMUSCULAR | Status: AC
  Filled 2015-08-15: qty 2

## 2015-08-15 MED ORDER — SODIUM CHLORIDE 0.9 % IV SOLN
2.0000 g | Freq: Once | INTRAVENOUS | Status: AC
  Administered 2015-08-15: 2 g via INTRAVENOUS
  Filled 2015-08-15: qty 2000

## 2015-08-15 MED ORDER — DIBUCAINE 1 % RE OINT: 1.0000 "application " | TOPICAL_OINTMENT | RECTAL | Status: DC | PRN

## 2015-08-15 MED ORDER — OXYCODONE-ACETAMINOPHEN 5-325 MG PO TABS: 2.0000 | ORAL_TABLET | ORAL | Status: DC | PRN

## 2015-08-15 MED ORDER — MISOPROSTOL 200 MCG PO TABS
ORAL_TABLET | ORAL | Status: AC
  Administered 2015-08-15: 1000 ug
  Filled 2015-08-15: qty 5

## 2015-08-15 MED ORDER — CITRIC ACID-SODIUM CITRATE 334-500 MG/5ML PO SOLN
30.0000 mL | ORAL | Status: DC | PRN
  Administered 2015-08-15: 30 mL via ORAL
  Filled 2015-08-15 (×2): qty 15

## 2015-08-15 MED ORDER — OXYTOCIN 40 UNITS IN LACTATED RINGERS INFUSION - SIMPLE MED
62.5000 mL/h | INTRAVENOUS | Status: DC
  Filled 2015-08-15: qty 1000

## 2015-08-15 MED ORDER — FENTANYL CITRATE (PF) 100 MCG/2ML IJ SOLN
INTRAMUSCULAR | Status: DC | PRN
  Administered 2015-08-15 (×2): 25 ug via INTRAVENOUS

## 2015-08-15 MED ORDER — ONDANSETRON HCL 4 MG/2ML IJ SOLN
INTRAMUSCULAR | Status: DC | PRN
  Administered 2015-08-15: 4 mg via INTRAVENOUS

## 2015-08-15 MED ORDER — SUCCINYLCHOLINE CHLORIDE 20 MG/ML IJ SOLN
INTRAMUSCULAR | Status: DC | PRN
  Administered 2015-08-15: 120 mg via INTRAVENOUS

## 2015-08-15 MED ORDER — SODIUM CHLORIDE 0.9 % IV SOLN
INTRAVENOUS | Status: DC | PRN
  Administered 2015-08-15: 11:00:00 via INTRAVENOUS

## 2015-08-15 MED ORDER — NEOSTIGMINE METHYLSULFATE 10 MG/10ML IV SOLN
INTRAVENOUS | Status: DC | PRN
  Administered 2015-08-15: 1 mg via INTRAVENOUS

## 2015-08-15 MED ORDER — PHENYLEPHRINE 40 MCG/ML (10ML) SYRINGE FOR IV PUSH (FOR BLOOD PRESSURE SUPPORT)
PREFILLED_SYRINGE | INTRAVENOUS | Status: AC
  Filled 2015-08-15: qty 20

## 2015-08-15 MED ORDER — ROCURONIUM BROMIDE 100 MG/10ML IV SOLN
INTRAVENOUS | Status: DC | PRN
  Administered 2015-08-15: 20 mg via INTRAVENOUS

## 2015-08-15 MED ORDER — ACETAMINOPHEN 325 MG PO TABS: 650.0000 mg | ORAL_TABLET | ORAL | Status: DC | PRN

## 2015-08-15 MED ORDER — MISOPROSTOL 200 MCG PO TABS
ORAL_TABLET | ORAL | Status: AC
  Filled 2015-08-15: qty 1

## 2015-08-15 MED ORDER — LIDOCAINE HCL (CARDIAC) 20 MG/ML IV SOLN
INTRAVENOUS | Status: AC
  Filled 2015-08-15: qty 5

## 2015-08-15 MED ORDER — ONDANSETRON HCL 4 MG/2ML IJ SOLN
INTRAMUSCULAR | Status: AC
  Filled 2015-08-15: qty 2

## 2015-08-15 MED ORDER — OXYTOCIN BOLUS FROM INFUSION
500.0000 mL | INTRAVENOUS | Status: DC
  Administered 2015-08-15: 500 mL via INTRAVENOUS

## 2015-08-15 MED ORDER — PHENYLEPHRINE 40 MCG/ML (10ML) SYRINGE FOR IV PUSH (FOR BLOOD PRESSURE SUPPORT): 80.0000 ug | PREFILLED_SYRINGE | INTRAVENOUS | Status: DC | PRN

## 2015-08-15 MED ORDER — FENTANYL CITRATE (PF) 100 MCG/2ML IJ SOLN
100.0000 ug | INTRAMUSCULAR | Status: DC | PRN
  Filled 2015-08-15: qty 2

## 2015-08-15 MED ORDER — LIDOCAINE HCL (PF) 1 % IJ SOLN
30.0000 mL | INTRAMUSCULAR | Status: DC | PRN
  Filled 2015-08-15 (×2): qty 30

## 2015-08-15 MED ORDER — ACETAMINOPHEN 325 MG PO TABS
650.0000 mg | ORAL_TABLET | ORAL | Status: DC | PRN
Start: 2015-08-15 — End: 2015-08-17

## 2015-08-15 MED ORDER — FENTANYL 2.5 MCG/ML BUPIVACAINE 1/10 % EPIDURAL INFUSION (WH - ANES): 14.0000 mL/h | INTRAMUSCULAR | Status: DC | PRN

## 2015-08-15 MED ORDER — LANOLIN HYDROUS EX OINT: TOPICAL_OINTMENT | CUTANEOUS | Status: DC | PRN

## 2015-08-15 MED ORDER — TETANUS-DIPHTH-ACELL PERTUSSIS 5-2.5-18.5 LF-MCG/0.5 IM SUSP
0.5000 mL | Freq: Once | INTRAMUSCULAR | Status: DC
  Filled 2015-08-15: qty 0.5

## 2015-08-15 MED ORDER — LACTATED RINGERS IV SOLN
INTRAVENOUS | Status: DC
  Administered 2015-08-15 (×2): via INTRAVENOUS

## 2015-08-15 MED ORDER — FENTANYL 2.5 MCG/ML BUPIVACAINE 1/10 % EPIDURAL INFUSION (WH - ANES)
INTRAMUSCULAR | Status: AC
  Filled 2015-08-15: qty 125

## 2015-08-15 MED ORDER — DIPHENOXYLATE-ATROPINE 2.5-0.025 MG PO TABS
1.0000 | ORAL_TABLET | Freq: Four times a day (QID) | ORAL | Status: DC | PRN
  Administered 2015-08-15: 1 via ORAL
  Filled 2015-08-15: qty 1

## 2015-08-15 SURGICAL SUPPLY — 31 items
BALLN POSTPARTUM SOS BAKRI (BALLOONS) ×3
BALLOON POSTPARTUM SOS BAKRI (BALLOONS) ×1 IMPLANT
BNDG GAUZE ELAST 4 BULKY (GAUZE/BANDAGES/DRESSINGS) ×3 IMPLANT
CATH ROBINSON RED A/P 16FR (CATHETERS) ×3 IMPLANT
CLOTH BEACON ORANGE TIMEOUT ST (SAFETY) ×3 IMPLANT
COUNTER NEEDLE 1200 MAGNETIC (NEEDLE) ×3 IMPLANT
DECANTER SPIKE VIAL GLASS SM (MISCELLANEOUS) ×3 IMPLANT
GLOVE BIOGEL PI IND STRL 7.0 (GLOVE) ×3 IMPLANT
GLOVE BIOGEL PI INDICATOR 7.0 (GLOVE) ×6
GLOVE ECLIPSE 7.0 STRL STRAW (GLOVE) ×3 IMPLANT
GOWN STRL REUS W/TWL LRG LVL3 (GOWN DISPOSABLE) ×6 IMPLANT
KIT BERKELEY 1ST TRIMESTER 3/8 (MISCELLANEOUS) ×3 IMPLANT
NS IRRIG 1000ML POUR BTL (IV SOLUTION) ×3 IMPLANT
PACK VAGINAL MINOR WOMEN LF (CUSTOM PROCEDURE TRAY) ×3 IMPLANT
PAD OB MATERNITY 4.3X12.25 (PERSONAL CARE ITEMS) ×3 IMPLANT
PAD PREP 24X48 CUFFED NSTRL (MISCELLANEOUS) ×3 IMPLANT
SET BERKELEY SUCTION TUBING (SUCTIONS) ×3 IMPLANT
SPONGE GAUZE 4X4 12PLY STER LF (GAUZE/BANDAGES/DRESSINGS) ×3 IMPLANT
SUT CHROMIC 2 0 CT 1 (SUTURE) ×6 IMPLANT
SUT VIC AB 0 CT1 27 (SUTURE) ×2
SUT VIC AB 0 CT1 27XBRD ANBCTR (SUTURE) ×1 IMPLANT
SUT VIC AB 0 CTX 36 (SUTURE) ×2
SUT VIC AB 0 CTX36XBRD ANBCTRL (SUTURE) ×1 IMPLANT
SUT VIC AB 2-0 SH 27 (SUTURE) ×4
SUT VIC AB 2-0 SH 27XBRD (SUTURE) ×2 IMPLANT
TOWEL OR 17X24 6PK STRL BLUE (TOWEL DISPOSABLE) ×6 IMPLANT
VACURETTE 10 RIGID CVD (CANNULA) IMPLANT
VACURETTE 6 ASPIR F TIP BERK (CANNULA) IMPLANT
VACURETTE 7MM CVD STRL WRAP (CANNULA) IMPLANT
VACURETTE 8 RIGID CVD (CANNULA) IMPLANT
VACURETTE 9 RIGID CVD (CANNULA) IMPLANT

## 2015-08-15 NOTE — Brief Op Note (Signed)
08/15/2015  12:30 PM  PATIENT:  Amanda Perez  24 y.o. female  PRE-OPERATIVE DIAGNOSIS:  24 yo G2P2002 with postpartum hemorrhage and periurethral laceration  POST-OPERATIVE DIAGNOSIS:  24 yo G2P2002 with LUS bleeding, posterior cervical laceration, and periurethral laceration  PROCEDURE:  Procedure(s): DILATATION and curretage, repair of laceration (N/A)  SURGEON:  Surgeon(s) and Role:  Elsie LincolnKelly Kellen Dutch, MD  ASSISTANTS: none   ANESTHESIA:   general  EBL:  Total I/O In: 3737 [I.V.:2800; Blood:937] Out: 2172 [Urine:400; Blood:1772]  BLOOD ADMINISTERED:2 CC PRBC and 1 FFP  DRAINS: Bakri balloon   LOCAL MEDICATIONS USED:  NONE  SPECIMEN:  No Specimen  DISPOSITION OF SPECIMEN:  N/A  COUNTS:  YES  TOURNIQUET:  * No tourniquets in log *  DICTATION: .Dragon Dictation  PLAN OF CARE: Admit to inpatient   PATIENT DISPOSITION:  ICU - extubated and stable.   Delay start of Pharmacological VTE agent (>24hrs) due to surgical blood loss or risk of bleeding: yes  Indications for procedure:  After a spontaneous NSVD, patient was noted to have BRB from the vagina of bright red blood with uterine massage. I was called to the room for evaluation. 200 mL of clot removed from the patient's vagina and vaginal vault. Methergine was given IM and 1000 mg of Cytotec was placed in the rectum. 2 mg of Dilaudid were given IV for patient comfort. Foley was placed into the bladder. Patient was unable to tolerate further exam so I  decided to go to the operating room for exam under anesthesia, repair of periurethral laceration, possible D&C and placement of Bakri balloon.  Risks of the procedure include but not limited to bleeding infection damage to the uterus including scarring preventing future fertility. Patient was also informed that hysterectomy could possibly be needed if bleeding could not be stopped with conservative measures. Anesthesia was called to determine route of anesthesia.  Procedure:  After informed consent was obtained patient was taken to the operating room where general anesthesia was induced. The patient is placed in dorsal lithotomy position and prepared and draped in normal sterile fashion. Foley was already in the bladder.  Zosyn was given IV to help with prevention of infection.  The periurethral laceration was noted to already be hemostatic. It did not require repair. Bimanual exam wasn't performed. The uterus was slightly enlarged with a small amount of clot at the fundus. I was able to completely feel the entire lining of the uterus and there did not seem to be any placenta or retained products of conception. There still was bright red bleeding that seemed to be coming from the lower uterine tract was placed to the abdomen and the cervix was grasped with a ring forcep a gentle D&C was performed with products of conceptions obtained. Warming forceps were used to walk around the cervix to see if there was a cervical laceration is a small posterior cervical laceration and this was repaired with 2-0 Vicryl and 2-0 chromic. There continued to be bright red bleeding from the uterus. A second bimanual was done with no products of conception or clot retrieved from the fundus. Methergine was given IM. The Bakri balloon was inserted and inflated with 240 mL of normal saline. This helped with the bleeding from the lower uterine segment.  The vagina, cervix, and periurethral area were inspected one last time. There was good hemostasis.  Kerlex was placed in the vagina to assure hemostasis of posterior cervix and help keep Barki balloon in the uterus.  Pt  went to the PACU in stable condition.  All counts correct x2.

## 2015-08-15 NOTE — Anesthesia Postprocedure Evaluation (Signed)
Anesthesia Post Note  Patient: Amanda Perez  Procedure(s) Performed: Procedure(s) (LRB): DILATATION and curretage, repair of laceration (N/A)  Patient location during evaluation: PACU Anesthesia Type: General Level of consciousness: awake and alert Pain management: pain level controlled Vital Signs Assessment: post-procedure vital signs reviewed and stable Respiratory status: spontaneous breathing, nonlabored ventilation, respiratory function stable and patient connected to nasal cannula oxygen Cardiovascular status: blood pressure returned to baseline and stable Postop Assessment: no signs of nausea or vomiting Anesthetic complications: no Comments: Patient SP MTP in OR.  Had 2 units RBC and 1 unit FFP.  Arterial line and 2nd IV will remain overnight.  VSS, patient looks goood    Last Vitals:  Filed Vitals:   08/15/15 1030 08/15/15 1235  BP: 119/71 127/66  Pulse: 82 77  Temp:  37.5 C  Resp:  16    Last Pain:  Filed Vitals:   08/15/15 1302  PainSc: 10-Worst pain ever                 Jaeshawn Silvio

## 2015-08-15 NOTE — MAU Note (Signed)
Having regular ctxs since 0400. Denies LOF or bleeding. 3.5-4cm last sve

## 2015-08-15 NOTE — Anesthesia Procedure Notes (Signed)
Procedure Name: MAC Date/Time: 08/15/2015 11:00 AM Performed by: Damontre Millea G Pre-anesthesia Checklist: Patient identified, Timeout performed, Emergency Drugs available, Suction available and Patient being monitored Patient Re-evaluated:Patient Re-evaluated prior to inductionOxygen Delivery Method: Circle system utilized Preoxygenation: Pre-oxygenation with 100% oxygen Intubation Type: IV induction Laryngoscope Size: Mac and 3 Grade View: Grade I Tube type: Oral Number of attempts: 1 (Manny MD performed continued laryngoscopy to confirm tube placemtent) Airway Equipment and Method: Stylet Placement Confirmation: ETT inserted through vocal cords under direct vision,  positive ETCO2 and breath sounds checked- equal and bilateral Secured at: 22 cm Tube secured with: Tape Dental Injury: Teeth and Oropharynx as per pre-operative assessment

## 2015-08-15 NOTE — Transfer of Care (Signed)
Immediate Anesthesia Transfer of Care Note  Patient: Amanda SettlerBriana Perez  Procedure(s) Performed: Procedure(s): DILATATION and curretage, repair of laceration (N/A)  Patient Location: PACU  Anesthesia Type:General  Level of Consciousness: awake and alert   Airway & Oxygen Therapy: Patient Spontanous Breathing  Post-op Assessment: Report given to RN and Post -op Vital signs reviewed and stable  Post vital signs: Reviewed and stable  Last Vitals:  Filed Vitals:   08/15/15 1011 08/15/15 1030  BP:  119/71  Pulse:  82  Temp:    Resp: 18     Complications: No apparent anesthesia complications

## 2015-08-15 NOTE — Progress Notes (Signed)
Results for Amanda Perez, [name] (MRN [id number]) as of 08/15/2015 21:07  Ref. Range 08/15/2015 20:35  Sample type Unknown ARTERIAL  Delivery systems Unknown ROOM AIR  FIO2 Unknown 0.21  pH, Arterial Latest Ref Range: 7.350-7.450  7.469 (H)  pCO2 arterial Latest Ref Range: 35.0-45.0 mmHg 29.5 (L)  pO2, Arterial Latest Ref Range: 80.0-100.0 mmHg 81.7  Bicarbonate Latest Ref Range: 20.0-24.0 mEq/L 21.2  TCO2 Latest Ref Range: 0-100 mmol/L 22.1  Acid-base deficit Latest Ref Range: 0.0-2.0 mmol/L 1.3  O2 Saturation Latest Units: % 95.0  Collection site Unknown ARTERIAL LINE  ABG results called  In to MD.

## 2015-08-15 NOTE — H&P (Signed)
LABOR AND DELIVERY ADMISSION HISTORY AND PHYSICAL NOTE  Amanda SettlerBriana Perez is a 24 y.o. female G2P1001 with IUP at 5714w4d by L/10 presenting for contractions. Painful and frequent for the past few hours. She reports +FMs, No LOF, no VB, no blurry vision, headaches or peripheral edema, or RUQ pain.  Taking acyclovir, no recent rash or burning/pain.   Prenatal History/Complications:  Past Medical History: Past Medical History  Diagnosis Date  . Other disorder of menstruation and other abnormal bleeding from female genital tract 07/10/2013    Bleeding daily since IUD inserted  . HSV-2 (herpes simplex virus 2) infection   . Pregnant 12/29/2014    Past Surgical History: Past Surgical History  Procedure Laterality Date  . No past surgeries      Obstetrical History: OB History    Gravida Para Term Preterm AB TAB SAB Ectopic Multiple Living   2 1 1       1       Social History: Social History   Social History  . Marital Status: Single    Spouse Name: N/A  . Number of Children: N/A  . Years of Education: N/A   Social History Main Topics  . Smoking status: Never Smoker   . Smokeless tobacco: Never Used  . Alcohol Use: No  . Drug Use: No  . Sexual Activity: Not Currently    Birth Control/ Protection: None   Other Topics Concern  . None   Social History Narrative    Family History: Family History  Problem Relation Age of Onset  . Hypertension Mother   . Seizures Mother   . Hypertension Paternal Grandmother   . Hypertension Maternal Grandmother     Allergies: No Known Allergies  Prescriptions prior to admission  Medication Sig Dispense Refill Last Dose  . acyclovir (ZOVIRAX) 400 MG tablet Take 1 tablet (400 mg total) by mouth 3 (three) times daily. 90 tablet 3 08/14/2015 at Unknown time  . prenatal vitamin w/FE, FA (PRENATAL 1 + 1) 27-1 MG TABS tablet Take 1 tablet by mouth daily at 12 noon. 30 each 11 08/14/2015 at Unknown time  . triamcinolone ointment (KENALOG) 0.1 %  Apply 1 application topically daily as needed.   08/14/2015 at Unknown time     Review of Systems   All systems reviewed and negative except as stated in HPI  Blood pressure 119/76, pulse 107, temperature 97.2 F (36.2 C), resp. rate 18, height 5' 3.5" (1.613 m), weight 199 lb 6.4 oz (90.447 kg), last menstrual period 11/11/2014. General appearance: alert, cooperative and appears stated age Lungs: clear to auscultation bilaterally Heart: regular rate and rhythm Abdomen: soft, non-tender; bowel sounds normal Extremities: No calf swelling or tenderness Presentation: cephalic Fetal monitoring: 140/mod/+a/early decels Uterine activity: q 2-3 min  Dilation: 6 Effacement (%): 90 Station: -2 Exam by:: Amanda Stanley RN   Prenatal labs: ABO, Rh: A/Positive/-- (06/01 1058) Antibody: Negative (09/13 0909) Rubella: !Error!immune RPR: Non Reactive (09/13 0909)  HBsAg: Negative (06/01 1058)  HIV: Non Reactive (09/13 0909)  GBS: Positive (11/16 0000)  2 hr glucola: 78/80/78 Genetic screening  First wnl Anatomy US wnl safe for caliectasis  Prenatal Transfer Tool  Maternal Diabetes: No Genetic Screening: Normal Maternal Ultrasounds/Referrals: fetal kidney abnormality Fetal Ultrasounds or other Referrals:  None Maternal Substance Abuse:  No Significant Maternal Medications:  None Significant Maternal Lab Results: Lab values include: Group Amanda Strep positive  No results found for this or any previous visit (from the past 24 hour(s)).  Patient Active  Problem List   Diagnosis Date Noted  . Normal labor 08/15/2015  . Monilial vulvovaginitis 03/18/2015  . Chlamydia infection affecting pregnancy in first trimester, antepartum 02/02/2015  . Supervision of normal pregnancy 12/29/2014  . HSV-2 (herpes simplex virus 2) infection     Assessment: Amanda Perez is a 24 y.o. G2P1001 at [redacted]w[redacted]d here for SOL. In active labor.  #Labor: expectant #Pain: epidural #FWB: Cat 1 #ID:  gbs positive,  starting ampicillin #MOF: breast #MOC: Mirena #Circ:  Yes, outpatient #HSV: suppressed, no signs/symptoms active lesions #Caliectasis: peds made aware  Amanda Perez 08/15/2015, 6:44 AM

## 2015-08-15 NOTE — Anesthesia Preprocedure Evaluation (Addendum)
Anesthesia Evaluation  Patient identified by MRN, date of birth, ID band Patient awake and Patient confused    Reviewed: Allergy & Precautions, H&P , NPO status , Patient's Chart, lab work & pertinent test results  Airway Mallampati: II   Neck ROM: full    Dental  (+) Teeth Intact, Dental Advidsory Given   Pulmonary    Pulmonary exam normal breath sounds clear to auscultation       Cardiovascular Exercise Tolerance: Good Normal cardiovascular exam Rhythm:regular Rate:Normal     Neuro/Psych    GI/Hepatic   Endo/Other    Renal/GU      Musculoskeletal   Abdominal   Peds  Hematology   Anesthesia Other Findings   Reproductive/Obstetrics (+) Pregnancy                             Anesthesia Physical Anesthesia Plan  ASA: II and emergent  Anesthesia Plan: General ETT   Post-op Pain Management:    Induction:   Airway Management Planned:   Additional Equipment:   Intra-op Plan:   Post-operative Plan:   Informed Consent:   Plan Discussed with:   Anesthesia Plan Comments:        Anesthesia Quick Evaluation

## 2015-08-15 NOTE — Lactation Note (Signed)
This note was copied from the chart of Amanda Perez [place]. Lactation Consultation Note  Patient Name: Amanda Perez Garver WUJWJ'XToday's Date: 08/15/2015 Reason for consult: Initial assessment;Other (Comment) (PPH); in AICU  Follow-up at 10 hrs of age; GA 39.4; BW 8 lbs, 3.4 oz;  Mom is a P2 with 6 weeks BF experience with first child. Mom in AICU with Hx PPH after delivery, consistent bright red blood.  [facility] and pt has A-Line in Left arm with board under arm for stabilization.  Difficulty with breastfeeding d/t lack of mobility of arms.  AICU called LC for BF assistance.   Infant has breastfed x1 (30 min) + formula x2 (10-15 ml); voids-1; stools-0 since birth 10 hrs ago. Taught mom hand expression with return demonstration and observation of several large drops of colostrum easily expressed.   LC unwrapped infant and put him on right breast cross-cradle hold.  Taught mom how to sandwich breast tissue and latch with asymmetrical latching.  Infant easily latched to right breast with depth, flanged lips, and rhythmical sucking; few swallows heard.  LS-8 Infant fed for 10 minutes and then came off.\ LC taught MGM (support person) how to assist with latching using teacup hold.  MGM and Mom independently latched infant to second side (left) in cross-cradle hold with wide mouth and flanged lips.   DEBP set up for extra stimulation d/t PPH and EBM supplementation as available.   Taught MGM how to set up pump on "initiate" setting with 3-4 teardrops.   Curved-tip syringe, spoons, and colostrum collection container given.  Verbally explained to [facility] how to spoon feed EBM to infant.   Educated on feeding cues and size of infant's stomach, and normal newborn behavior of feeding at least 8-12 times per day.   Lactation brochure given and encouraged keeping feeding log.  Encouraged to call RN for assistance with latching as needed.    Infant was still on breast when LC  left room.   Report given to RN.     Maternal Data Formula Feeding for Exclusion: Yes Reason for exclusion: Admission to Intensive Care Unit (ICU) post-partum Has patient been taught Hand Expression?: Yes (several drops colostrum easily expressed) Does the patient have breastfeeding experience prior to this delivery?: Yes  Feeding Feeding Type: Breast Fed  LATCH Score/Interventions Latch: Grasps breast easily, tongue down, lips flanged, rhythmical sucking.  Audible Swallowing: A few with stimulation  Type of Nipple: Everted at rest and after stimulation  Comfort (Breast/Nipple): Soft / non-tender     Hold (Positioning): Assistance needed to correctly position infant at breast and maintain latch. Intervention(s): Breastfeeding basics reviewed;Support Pillows;Skin to skin  LATCH Score: 8  Lactation Tools Discussed/Used WIC Program: Yes Aaron Edelman([place]) Pump Review: Setup, frequency, and cleaning;Milk Storage Initiated by:: RN & Burna SisS. Melitta Tigue, RN, IBCLC Date initiated:: 08/15/15   Consult Status Consult Status: Follow-up Date: 08/16/15 Follow-up type: In-patient    Lendon KaVann, Arliene Rosenow Walker 08/15/2015, 6:28 PM

## 2015-08-15 NOTE — Progress Notes (Signed)
Dr leggett at bedside to check on pts bleeding - speculum exam performed and packing placed in vagina - pt made aware that she would need to go to or for repair

## 2015-08-15 NOTE — Progress Notes (Signed)
Called to Code MTP, RT called to set up arterial line and stick for arterial line. RT made one attempt and anesthesia doctor wanted to try. Arterial line placed by anesthesia doctor and line was set up by RT. Anesthesia doctor and anesthesia nurse taped and secured the line.

## 2015-08-15 NOTE — Progress Notes (Signed)
Dr Ashok PallWouk in to see pt and discuss admission

## 2015-08-16 ENCOUNTER — Encounter (HOSPITAL_COMMUNITY)
Admission: AD | Disposition: A | Discharge status: Home / Self Care | Payer: Self-pay | Source: Ambulatory Visit | Attending: Obstetrics & Gynecology

## 2015-08-16 ENCOUNTER — Encounter (HOSPITAL_COMMUNITY): Payer: Self-pay | Admitting: *Deleted

## 2015-08-16 LAB — CBC WITH DIFFERENTIAL/PLATELET
Basophils Absolute: 0 10*3/uL (ref 0.0–0.1)
Basophils Relative: 0 %
Eosinophils Absolute: 0.1 10*3/uL (ref 0.0–0.7)
Eosinophils Relative: 1 %
HEMATOCRIT: 28.1 % — AB (ref 36.0–46.0)
HEMOGLOBIN: 10 g/dL — AB (ref 12.0–15.0)
LYMPHS ABS: 2.6 10*3/uL (ref 0.7–4.0)
Lymphocytes Relative: 18 %
MCH: 31.2 pg (ref 26.0–34.0)
MCHC: 35.6 g/dL (ref 30.0–36.0)
MCV: 87.5 fL (ref 78.0–100.0)
MONO ABS: 1.2 10*3/uL — AB (ref 0.1–1.0)
MONOS PCT: 8 %
NEUTROS ABS: 10.7 10*3/uL — AB (ref 1.7–7.7)
NEUTROS PCT: 73 %
Platelets: 139 10*3/uL — ABNORMAL LOW (ref 150–400)
RBC: 3.21 MIL/uL — ABNORMAL LOW (ref 3.87–5.11)
RDW: 14 % (ref 11.5–15.5)
WBC: 14.7 10*3/uL — ABNORMAL HIGH (ref 4.0–10.5)

## 2015-08-16 SURGERY — DILATION AND EVACUATION, UTERUS
Anesthesia: Choice

## 2015-08-16 MED ORDER — SODIUM CHLORIDE 0.9 % IJ SOLN
3.0000 mL | INTRAMUSCULAR | Status: DC | PRN
  Administered 2015-08-16: 3 mL via INTRAVENOUS
  Filled 2015-08-16: qty 3

## 2015-08-16 MED ORDER — SODIUM CHLORIDE 0.9 % IJ SOLN
3.0000 mL | Freq: Two times a day (BID) | INTRAMUSCULAR | Status: DC
  Administered 2015-08-16: 3 mL via INTRAVENOUS

## 2015-08-16 MED ORDER — METHYLERGONOVINE MALEATE 0.2 MG PO TABS
0.2000 mg | ORAL_TABLET | Freq: Four times a day (QID) | ORAL | Status: DC
  Administered 2015-08-16 – 2015-08-17 (×5): 0.2 mg via ORAL
  Filled 2015-08-16 (×5): qty 1

## 2015-08-16 NOTE — Lactation Note (Signed)
This note was copied from the chart of Amanda [name]. Lactation Consultation Note  Patient Name: Amanda Perez ZOXWR'UToday's Date: 08/16/2015 Reason for consult: Follow-up assessment  Baby 31 hours old. Mom reports that baby just had a bottle of formula within the last hour. Mom reports that baby nursed earlier and she feels comfortable with latching baby. Mom states that CN nurses have helped her with latching. Set mom up with pillows for her arms and discussed offering baby the breast first at each feeding, then supplementing with EBM/formula, and then post-pumping if baby not nursing well. Mom states that she does not have a DEBP, but has been active with WIC. Mom given hand pump with review. Mom aware of OP/BFSG and LC phone line assistance after D/C.  Maternal Data    Feeding Feeding Type: Bottle Fed - Formula  LATCH Score/Interventions                      Lactation Tools Discussed/Used     Consult Status Consult Status: Follow-up Date: 08/17/15 Follow-up type: In-patient    Geralynn OchsWILLIARD, Marisol Glazer 08/16/2015, 3:22 PM

## 2015-08-16 NOTE — Progress Notes (Addendum)
Vaginal packing & Bakri balloon removed by Dr Debroah LoopArnold - balloon was completely deflated.

## 2015-08-16 NOTE — Progress Notes (Signed)
Stopcock removed from Bakri balloon port - 40cc withdrawn easily, but unable to withdraw more.

## 2015-08-16 NOTE — Addendum Note (Signed)
Addendum  created 08/16/15 0809 by Junious SilkMelinda Travone Georg, CRNA   Modules edited: Clinical Notes   Clinical Notes:  File: [id number]

## 2015-08-16 NOTE — Progress Notes (Signed)
1 Day Post-Op Procedure(s) (LRB): DILATATION and curretage, repair of laceration (N/A) Postpartum hemorrhage  Subjective: Patient reports minimal pain, + flatus, not ambulatng yet due to bakri  Objective: I have reviewed patient's vital signs, intake and output, medications and labs.  General: alert, cooperative and no distress Resp: clear to auscultation bilaterally Cardio: regular rate and rhythm GI: soft, fundus firm below umbilicus, no rebound, no guarding, non tender Extremities: extremities normal, atraumatic, no cyanosis or edema Vaginal Bleeding: 40 cc from Barki last 12 hours, scant bleeding on pad (sponge in vagina putting pressure on posterior cervix  CBC    Component Value Date/Time   WBC 14.7* 08/16/2015 0705   WBC 7.1 05/25/2015 0909   RBC 3.21* 08/16/2015 0705   RBC 3.93 05/25/2015 0909   HGB 10.0* 08/16/2015 0705   HCT 28.1* 08/16/2015 0705   HCT 35.1 05/25/2015 0909   PLT 139* 08/16/2015 0705   MCV 87.5 08/16/2015 0705   MCH 31.2 08/16/2015 0705   MCH 31.6 05/25/2015 0909   MCHC 35.6 08/16/2015 0705   MCHC 35.3 05/25/2015 0909   RDW 14.0 08/16/2015 0705   RDW 12.9 05/25/2015 0909   LYMPHSABS 2.6 08/16/2015 0705   MONOABS 1.2* 08/16/2015 0705   EOSABS 0.1 08/16/2015 0705   BASOSABS 0.0 08/16/2015 0705     Assessment: s/p Procedure(s): DILATATION and curretage, repair of laceration (N/A): stable  Plan: Continue methergine series  Will decrease bakri balloon slowly after 24 hours in situ Sips of clears   LOS: 1 day    Enaya Howze H. 08/16/2015, 8:10 AM

## 2015-08-16 NOTE — Progress Notes (Signed)
1300 Unable to withdraw fluid from Bakri balloon via stopcock - Dr Debroah LoopArnold notified.  1310 Dr Debroah LoopArnold here - also unable to withdraw fluid. Stopcock removed by Dr Debroah LoopArnold - free flow of fluid from catheter, syringe applied & 45 cc clear fluid withdrawn, stopcock put back on. Bed pad changed with estimated 30cc of fluid leaked from catheter.

## 2015-08-16 NOTE — Progress Notes (Signed)
Post Partum Day 1 Subjective: no complaints  Objective: Blood pressure 105/65, pulse 80, temperature 98.4 F (36.9 C), temperature source Oral, resp. rate 18, height 5\' 4"  (1.626 m), weight 88.089 kg (194 lb 3.2 oz), last menstrual period 11/11/2014, SpO2 97 %, unknown if currently breastfeeding.  Physical Exam:  General: alert, cooperative and no distress Lochia: appropriate Uterine Fundus: firm Incision:   DVT Evaluation: No evidence of DVT seen on physical exam. Bakri balloon deflated and removed, all packing removed  Recent Labs  08/15/15 1850 08/16/15 0705  HGB 10.8* 10.0*  HCT 31.2* 28.1*    Assessment/Plan: Breastfeeding When stable may transfer from ICU  LOS: 1 day   Yissel Habermehl 08/16/2015, 4:02 PM

## 2015-08-16 NOTE — Anesthesia Postprocedure Evaluation (Signed)
Anesthesia Post Note  Patient: Amanda Perez  Procedure(s) Performed: Procedure(s) (LRB): DILATATION and curretage, repair of laceration (N/A)  Patient location during evaluation: [facility] Unit Anesthesia Type: General Level of consciousness: awake, awake and alert and oriented Pain management: pain level controlled Vital Signs Assessment: post-procedure vital signs reviewed and stable Respiratory status: spontaneous breathing, nonlabored ventilation and respiratory function stable Cardiovascular status: blood pressure returned to baseline Postop Assessment: no headache, no backache, patient able to bend at knees, no signs of nausea or vomiting and adequate PO intake Anesthetic complications: no    Last Vitals:  Filed Vitals:   08/16/15 0645 08/16/15 0700  BP:    Pulse: 81 82  Temp:    Resp:      Last Pain:  Filed Vitals:   08/16/15 0737  PainSc: 1                  Norberto Wishon

## 2015-08-16 NOTE — Progress Notes (Signed)
Unable to withdraw fluid from Bakri catheter - Dr Debroah LoopArnold notified.

## 2015-08-17 ENCOUNTER — Encounter: Payer: Medicaid Other | Admitting: Women's Health

## 2015-08-17 DIAGNOSIS — IMO0001 Reserved for inherently not codable concepts without codable children: Secondary | ICD-10-CM

## 2015-08-17 LAB — PREPARE FRESH FROZEN PLASMA
UNIT DIVISION: 0
UNIT DIVISION: 0
UNIT DIVISION: 0
Unit division: 0

## 2015-08-17 MED ORDER — IBUPROFEN 600 MG PO TABS
600.0000 mg | ORAL_TABLET | Freq: Four times a day (QID) | ORAL | Status: DC
Start: 2015-08-17 — End: 2018-05-31

## 2015-08-17 NOTE — Discharge Instructions (Signed)

## 2015-08-17 NOTE — Discharge Summary (Signed)
OB Discharge Summary     Patient Name: Amanda Perez DOB: 02/29/1991 MRN: [id number]  Date of admission: 08/15/2015 Delivering MD: Shonna ChockWOUK, NOAH BEDFORD   Date of discharge: 08/17/2015  Admitting diagnosis: 39wks, frequent contractions vaginal bleeding Intrauterine pregnancy: 6219w4d     Secondary diagnosis:  Principal Problem:   Status post vaginal delivery Active Problems:   HSV-2 (herpes simplex virus 2) infection   Supervision of normal pregnancy   Chlamydia infection affecting pregnancy in first trimester, antepartum   Normal labor  Additional problems:  Post-partum hemorrhage requiring Bakri placement      Discharge diagnosis: Term Pregnancy Delivered                                                                                                Post partum procedures:blood transfusion and Bakri placement   Augmentation: None  Complications: None  Hospital course:  Onset of Labor With Vaginal Delivery     24 y.o. yo [id number] at 5919w4d was admitted in Active Laboron 08/15/2015. Patient was GBS positive and received ampicillin, followed shortly by preciptious delivery an hour later.  Patient had an uncomplicated labor course as follows:  Membrane Rupture Time/Date: 7:25 AM ,08/15/2015   Intrapartum Procedures: Episiotomy: None [1]                                         Lacerations:  None [1]  Patient had a delivery of a Viable infant. 08/15/2015  Information for the patient's newborn:  Amanda SpearingJohnson, Boy Amanda [[id number]][[id number]]  Delivery Method: Vaginal, Spontaneous Delivery (Filed from Delivery Summary)    Pateint had with postpartum hemorrhage, with Bakri placement to stop lower uterine bleeding. Durning procedure patient received 2 units of pRBCs and FFP. After 24 hours, Bakri was removed and patient states that bleeding has since improved significantly.  Patient's hemoglobin remained stable throughout (hgb 11>2pRBC> 10.8> 10) She is ambulating, tolerating a regular diet, passing flatus,  and urinating well. Patient is discharged home in stable condition on 08/17/2015    Physical exam  Filed Vitals:   08/16/15 1640 08/16/15 1800 08/16/15 2200 08/17/15 0552  BP: 108/82 123/71 113/61 114/76  Pulse: 88 72 70 59  Temp:  98.4 F (36.9 C) 98.5 F (36.9 C) 97.8 F (36.6 C)  TempSrc:  Oral Oral Core (Comment)  Resp:  18 18 18   Height:      Weight:      SpO2: 97% 99% 99%    General: alert, cooperative and no distress Lochia: appropriate Uterine Fundus: firm Incision: N/A DVT Evaluation: No evidence of DVT seen on physical exam. Labs: Lab Results  Component Value Date   WBC 14.7* 08/16/2015   HGB 10.0* 08/16/2015   HCT 28.1* 08/16/2015   MCV 87.5 08/16/2015   PLT 139* 08/16/2015   No flowsheet data found.  Discharge instruction: per After Visit Summary and "Baby and Me Booklet".  After visit meds:    Medication List    STOP taking these medications  acyclovir 400 MG tablet  Commonly known as:  ZOVIRAX      TAKE these medications        ibuprofen 600 MG tablet  Commonly known as:  ADVIL,MOTRIN  Take 1 tablet (600 mg total) by mouth every 6 (six) hours.      ASK your doctor about these medications        prenatal multivitamin Tabs tablet  Take 1 tablet by mouth at bedtime.     triamcinolone ointment 0.1 %  Commonly known as:  KENALOG  Apply 1 application topically daily as needed (for itching).        Diet: routine diet  Activity: Advance as tolerated. Pelvic rest for 6 weeks.   Outpatient follow up:6 weeks Follow up Appt:Future Appointments Date Time Provider Department Center  09/16/2015 1:30 PM Jacklyn Shell, CNM [facility]   Follow up Visit:No Follow-up on file.  Postpartum contraception: IUD Mirena  Newborn Data: Live born female  Birth Weight: 8 lb 3.4 oz (3725 g) APGAR: 9, 9  Baby Feeding: Breast Disposition:home with mother   08/17/2015 Amanda Maiers, MD  OB fellow attestation I have seen and  examined this patient and agree with above documentation in the resident's note.   Amanda Perez is a 24 y.o. [id number] s/p NSVD with PPH, HGB stable.   Pain is well controlled.  Plan for birth control is IUD.  Method of Feeding: Breast/Bottle  PE:  BP 114/76 mmHg  Pulse 59  Temp(Src) 97.8 F (36.6 C) (Core (Comment))  Resp 18  Ht  (1.626 m)  Wt 194 lb 3.2 oz (88.089 kg)  BMI 33.32 kg/m2  SpO2 99%  LMP 11/11/2014  Breastfeeding? Unknown Gen: well appearing Heart: reg rate Lungs: normal WOB Fundus firm Ext: soft, no pain, no edema   Recent Labs  08/15/15 1850 08/16/15 0705  HGB 10.8* 10.0*  HCT 31.2* 28.1*   Plan: discharge today - postpartum care discussed - f/u clinic in 6 weeks for postpartum visit   Federico Flake, MD 8:55 AM

## 2015-08-17 NOTE — Lactation Note (Signed)
This note was copied from the chart of Amanda Sharion SettlerBriana Penn. Lactation Consultation Note: Mother's breast are full and firm. Assist mother with massage and sat her pump up to pump. Advised mother to pump for 20 mins. Mother to do good breast massage and to ice breast every 3-4 hours. Advised mother to breast feed infant 8-12 times in 24 hours. Mother plans to follow up with [facility]. She is aware of available LC services and community support.   Patient Name: Amanda Perez VWUJW'JToday's Date: 08/17/2015 Reason for consult: Follow-up assessment   Maternal Data    Feeding    [facility]Interventions                      Lactation Tools Discussed/Used     Consult Status      Amanda Perez, Amanda Perez 08/17/2015, 12:39 PM

## 2015-08-18 LAB — TYPE AND SCREEN
ABO/RH(D): A POS
ANTIBODY SCREEN: NEGATIVE
UNIT DIVISION: 0
UNIT DIVISION: 0
UNIT DIVISION: 0
Unit division: 0
Unit division: 0
Unit division: 0

## 2015-08-20 ENCOUNTER — Telehealth: Payer: Self-pay | Admitting: *Deleted

## 2015-08-20 NOTE — Telephone Encounter (Signed)
[facility]/2015 SVD, Hgb today 9.6, pt taking PNV and informed to eat foods rich in iron.

## 2015-08-24 MED ORDER — FUSION PLUS PO CAPS: 1.0000 | ORAL_CAPSULE | ORAL | Status: DC

## 2015-08-24 NOTE — Telephone Encounter (Signed)
Pt informed Rx for iron sent to pharmacy, continue to take PNV and eat foods rich in iron. Pt verbalized understanding.

## 2015-09-16 ENCOUNTER — Encounter: Payer: Self-pay | Admitting: Advanced Practice Midwife

## 2015-09-16 ENCOUNTER — Ambulatory Visit (INDEPENDENT_AMBULATORY_CARE_PROVIDER_SITE_OTHER): Payer: Medicaid Other | Admitting: Advanced Practice Midwife

## 2015-09-16 NOTE — Progress Notes (Signed)
Amanda SettlerBriana Perez is a 25 y.o. who presents for a postpartum visit. She is 4 weeks postpartum following a spontaneous vaginal delivery. I have fully reviewed the prenatal and intrapartum course. The delivery was at 39.4 gestational weeks.  Anesthesia: none. Postpartum course has been uneventful. She did have to go to OR for lac repair.and Bakri balloon. She did have to get blood Baby's course has been uneventful. Baby is feeding by breast and bottle. Bleeding: staining only. Bowel function is normal. Bladder function is normal. Patient is not sexually active.She and the FOB are not together.  BC options discussed . Will try Nexplanon   Current outpatient prescriptions:  .  ibuprofen (ADVIL,MOTRIN) 600 MG tablet, Take 1 tablet (600 mg total) by mouth every 6 (six) hours., Disp: 90 tablet, Rfl: 0 .  Iron-FA-B Cmp-C-Biot-Probiotic (FUSION PLUS) CAPS, Take 1 capsule by mouth See admin instructions. 1 time daily between meals, Disp: 30 capsule, Rfl: 6 .  Prenatal Vit-Fe Fumarate-FA (PRENATAL MULTIVITAMIN) TABS tablet, Take 1 tablet by mouth at bedtime., Disp: , Rfl:   Review of Systems   Constitutional: Negative for fever and chills Eyes: Negative for visual disturbances Respiratory: Negative for shortness of breath, dyspnea Cardiovascular: Negative for chest pain or palpitations  Gastrointestinal: Negative for vomiting, diarrhea and constipation Genitourinary: Negative for dysuria and urgency Musculoskeletal: Negative for back pain, joint pain, myalgias  Neurological: Negative for dizziness and headaches   Objective:     Filed Vitals:   09/16/15 1340  BP: 100/70  Pulse: 80   General:  alert, cooperative and no distress   Breasts:  negative  Lungs: clear to auscultation bilaterally  Heart:  regular rate and rhythm  Abdomen: Soft, nontender   Vulva:  normal  Vagina: normal vagina  Cervix:  closed  Corpus: Well involuted     Rectal Exam: no hemorrhoids        Assessment:    normal  postpartum exam.  Plan:    1. Contraception:  2. Follow up in:  3 weeks for Nexpalnon or as needed.

## 2015-09-16 NOTE — Patient Instructions (Signed)
Etonogestrel implant What is this medicine? ETONOGESTREL (et oh noe JES trel) is a contraceptive (birth control) device. It is used to prevent pregnancy. It can be used for up to 3 years. This medicine may be used for other purposes; ask your health care provider or pharmacist if you have questions. What should I tell my health care provider before I take this medicine? They need to know if you have any of these conditions: -abnormal vaginal bleeding -blood vessel disease or blood clots -cancer of the breast, cervix, or liver -depression -diabetes -gallbladder disease -headaches -heart disease or recent heart attack -high blood pressure -high cholesterol -kidney disease -liver disease -renal disease -seizures -tobacco smoker -an unusual or allergic reaction to etonogestrel, other hormones, anesthetics or antiseptics, medicines, foods, dyes, or preservatives -pregnant or trying to get pregnant -breast-feeding How should I use this medicine? This device is inserted just under the skin on the inner side of your upper arm by a health care professional. Talk to your pediatrician regarding the use of this medicine in children. Special care may be needed. Overdosage: If you think you have taken too much of this medicine contact a poison control center or emergency room at once. NOTE: This medicine is only for you. Do not share this medicine with others. What if I miss a dose? This does not apply. What may interact with this medicine? Do not take this medicine with any of the following medications: -amprenavir -bosentan -fosamprenavir This medicine may also interact with the following medications: -barbiturate medicines for inducing sleep or treating seizures -certain medicines for fungal infections like ketoconazole and itraconazole -griseofulvin -medicines to treat seizures like carbamazepine, felbamate, oxcarbazepine, phenytoin,  topiramate -modafinil -phenylbutazone -rifampin -some medicines to treat HIV infection like atazanavir, indinavir, lopinavir, nelfinavir, tipranavir, ritonavir -St. John's wort This list may not describe all possible interactions. Give your health care provider a list of all the medicines, herbs, non-prescription drugs, or dietary supplements you use. Also tell them if you smoke, drink alcohol, or use illegal drugs. Some items may interact with your medicine. What should I watch for while using this medicine? This product does not protect you against HIV infection (AIDS) or other sexually transmitted diseases. You should be able to feel the implant by pressing your fingertips over the skin where it was inserted. Contact your doctor if you cannot feel the implant, and use a non-hormonal birth control method (such as condoms) until your doctor confirms that the implant is in place. If you feel that the implant may have broken or become bent while in your arm, contact your healthcare provider. What side effects may I notice from receiving this medicine? Side effects that you should report to your doctor or health care professional as soon as possible: -allergic reactions like skin rash, itching or hives, swelling of the face, lips, or tongue -breast lumps -changes in emotions or moods -depressed mood -heavy or prolonged menstrual bleeding -pain, irritation, swelling, or bruising at the insertion site -scar at site of insertion -signs of infection at the insertion site such as fever, and skin redness, pain or discharge -signs of pregnancy -signs and symptoms of a blood clot such as breathing problems; changes in vision; chest pain; severe, sudden headache; pain, swelling, warmth in the leg; trouble speaking; sudden numbness or weakness of the face, arm or leg -signs and symptoms of liver injury like dark yellow or brown urine; general ill feeling or flu-like symptoms; light-colored stools; loss of  appetite; nausea; right upper belly   pain; unusually weak or tired; yellowing of the eyes or skin -unusual vaginal bleeding, discharge -signs and symptoms of a stroke like changes in vision; confusion; trouble speaking or understanding; severe headaches; sudden numbness or weakness of the face, arm or leg; trouble walking; dizziness; loss of balance or coordination Side effects that usually do not require medical attention (Report these to your doctor or health care professional if they continue or are bothersome.): -acne -back pain -breast pain -changes in weight -dizziness -general ill feeling or flu-like symptoms -headache -irregular menstrual bleeding -nausea -sore throat -vaginal irritation or inflammation This list may not describe all possible side effects. Call your doctor for medical advice about side effects. You may report side effects to FDA at 1-800-FDA-1088. Where should I keep my medicine? This drug is given in a hospital or clinic and will not be stored at home. NOTE: This sheet is a summary. It may not cover all possible information. If you have questions about this medicine, talk to your doctor, pharmacist, or health care provider.    2016, Elsevier/Gold Standard. (2014-06-12 14:07:06)  

## 2015-09-28 ENCOUNTER — Telehealth: Payer: Self-pay | Admitting: *Deleted

## 2015-09-28 NOTE — Telephone Encounter (Signed)
Pt requesting a return to work note for asap. Pt had her postpartum appt on 09/16/2015 with Cathie Beams, CNM, Laceration repair 08/15/2015. Please advise.

## 2015-09-29 NOTE — Telephone Encounter (Signed)
She can go back to work without restrictions whenever she wants.

## 2015-09-29 NOTE — Telephone Encounter (Signed)
Return to work note left at front desk for pick up.

## 2015-10-13 ENCOUNTER — Encounter: Payer: Self-pay | Admitting: Advanced Practice Midwife

## 2015-10-13 ENCOUNTER — Ambulatory Visit (INDEPENDENT_AMBULATORY_CARE_PROVIDER_SITE_OTHER): Payer: Medicaid Other | Admitting: Advanced Practice Midwife

## 2015-10-13 VITALS — BP 120/80 | HR 66 | Wt 173.0 lb

## 2015-10-13 DIAGNOSIS — Z3202 Encounter for pregnancy test, result negative: Secondary | ICD-10-CM | POA: Diagnosis not present

## 2015-10-13 DIAGNOSIS — Z30017 Encounter for initial prescription of implantable subdermal contraceptive: Secondary | ICD-10-CM | POA: Diagnosis not present

## 2015-10-13 LAB — POCT URINE PREGNANCY: Preg Test, Ur: NEGATIVE

## 2015-10-13 NOTE — Progress Notes (Signed)
  HPI:  Amanda Perez is a 25 y.o. year old African American female here for Nexplanon insertion.  She has not been sexually active since delivery (not together with FOB) , and her pregnancy test today was negative.  Risks/benefits/side effects of Nexplanon have been discussed and her questions have been answered.  Specifically, a failure rate of 09/998 has been reported, with an increased failure rate if pt takes St. John's Wort and/or antiseizure medicaitons.  Amanda Perez is aware of the common side effect of irregular bleeding, which the incidence of decreases over time.   Past Medical History: Past Medical History  Diagnosis Date  . Other disorder of menstruation and other abnormal bleeding from female genital tract 07/10/2013    Bleeding daily since IUD inserted  . HSV-2 (herpes simplex virus 2) infection   . Pregnant 12/29/2014    Past Surgical History: Past Surgical History  Procedure Laterality Date  . No past surgeries    . Dilation and evacuation N/A 08/15/2015    Procedure: DILATATION and curretage, repair of laceration;  Surgeon: Tereso Newcomer, MD;  Location: [facility];  Service: Gynecology;  Laterality: N/A;    Family History: Family History  Problem Relation Age of Onset  . Hypertension Mother   . Seizures Mother   . Hypertension Paternal Grandmother   . Hypertension Maternal Grandmother     Social History: Social History  Substance Use Topics  . Smoking status: Never Smoker   . Smokeless tobacco: Never Used  . Alcohol Use: No    Allergies: No Known Allergies    Her left arm, approximatly 4 inches proximal from the elbow, was cleansed with alcohol and anesthetized with 2cc of 2% Lidocaine.  The area was cleansed again and the Nexplanon was inserted without difficulty.  A pressure bandage was applied.  Pt was instructed to remove pressure bandage in a few hours, and keep insertion site covered with a bandaid for 3 days.  Back up contraception was  recommended for 2 weeks prn.  Follow-up scheduled PRN problems     Has thrush on Left nipple (baby only nurses from left) Painted with gentian vioilet.  Baby getting drops  CRESENZO-DISHMAN,Amanda Perez 10/13/2015 11:06 AM

## 2016-05-10 ENCOUNTER — Emergency Department (HOSPITAL_COMMUNITY)
Admission: EM | Admit: 2016-05-10 | Discharge: 2016-05-10 | Disposition: A | Discharge status: Home / Self Care | Payer: Medicaid Other | Attending: Emergency Medicine | Admitting: Emergency Medicine

## 2016-05-10 ENCOUNTER — Encounter (HOSPITAL_COMMUNITY): Payer: Self-pay

## 2016-05-10 DIAGNOSIS — L259 Unspecified contact dermatitis, unspecified cause: Secondary | ICD-10-CM | POA: Diagnosis not present

## 2016-05-10 DIAGNOSIS — L309 Dermatitis, unspecified: Secondary | ICD-10-CM

## 2016-05-10 DIAGNOSIS — N390 Urinary tract infection, site not specified: Secondary | ICD-10-CM | POA: Insufficient documentation

## 2016-05-10 DIAGNOSIS — R300 Dysuria: Secondary | ICD-10-CM | POA: Diagnosis present

## 2016-05-10 LAB — URINE MICROSCOPIC-ADD ON

## 2016-05-10 LAB — URINALYSIS, ROUTINE W REFLEX MICROSCOPIC
Bilirubin Urine: NEGATIVE
Glucose, UA: NEGATIVE mg/dL
Ketones, ur: NEGATIVE mg/dL
Nitrite: POSITIVE — AB
Protein, ur: 100 mg/dL — AB
Specific Gravity, Urine: 1.02 (ref 1.005–1.030)
pH: 6 (ref 5.0–8.0)

## 2016-05-10 LAB — PREGNANCY, URINE: Preg Test, Ur: NEGATIVE

## 2016-05-10 MED ORDER — CEPHALEXIN 500 MG PO CAPS
500.0000 mg | ORAL_CAPSULE | Freq: Once | ORAL | Status: AC
  Administered 2016-05-10: 500 mg via ORAL
  Filled 2016-05-10: qty 1

## 2016-05-10 MED ORDER — CEPHALEXIN 500 MG PO CAPS: 500.0000 mg | ORAL_CAPSULE | Freq: Four times a day (QID) | ORAL | 0 refills | Status: DC

## 2016-05-10 MED ORDER — TRIAMCINOLONE ACETONIDE 0.1 % EX OINT: 1.0000 "application " | TOPICAL_OINTMENT | Freq: Two times a day (BID) | CUTANEOUS | 0 refills | Status: DC

## 2016-05-10 NOTE — Discharge Instructions (Signed)
Drink plenty of water.  Take the antibiotic as directed until its finished.  Follow-up with your doctor or return here if not improving

## 2016-05-10 NOTE — ED Provider Notes (Signed)
[facility]Y DEPT Provider Note   CSN: [id number] Arrival date & time: 05/10/16  1641     History   Chief Complaint Chief Complaint  Patient presents with  . Other    cloudy urine  . Abrasion    HPI Amanda Perez is a 25 y.o. female.  HPI   Amanda Perez is a 25 y.o. female who presents to the Emergency Department complaining of dysuria and rash.  She reports noticing malodorous, cloudy urine for several days associated with a slight urgency.  She also describes a "pressure" and aching to her lower abdomen.  She denies fever, back pain, vaginal bleeding, discharge or new sexual partners.  She also complains of itching, rash to her back and arms and states that she has eczema and states she has an out of her eczema cream.     Past Medical History:  Diagnosis Date  . HSV-2 (herpes simplex virus 2) infection   . Other disorder of menstruation and other abnormal bleeding from female genital tract 07/10/2013   Bleeding daily since IUD inserted  . Pregnant 12/29/2014    Patient Active Problem List   Diagnosis Date Noted  . Nexplanon insertion 10/13/2015  . Monilial vulvovaginitis 03/18/2015  . HSV-2 (herpes simplex virus 2) infection     Past Surgical History:  Procedure Laterality Date  . DILATION AND EVACUATION N/A 08/15/2015   Procedure: DILATATION and curretage, repair of laceration;  Surgeon: Tereso Newcomer, MD;  Location: [facility];  Service: Gynecology;  Laterality: N/A;  . NO PAST SURGERIES      OB History    Gravida Para Term Preterm AB Living   2 2 2     2    SAB TAB Ectopic Multiple Live Births         0 2       Home Medications    Prior to Admission medications   Medication Sig Start Date End Date Taking? Authorizing Provider  ibuprofen (ADVIL,MOTRIN) 600 MG tablet Take 1 tablet (600 mg total) by mouth every 6 (six) hours. 08/17/15   Asiyah Mayra Reel, MD  Iron-FA-B Cmp-C-Biot-Probiotic (FUSION PLUS) CAPS Take 1 capsule by mouth See admin  instructions. 1 time daily between meals 08/24/15   Cheral Marker, CNM  Prenatal Vit-Fe Fumarate-FA (PRENATAL MULTIVITAMIN) TABS tablet Take 1 tablet by mouth at bedtime.    Historical Provider, MD    Family History Family History  Problem Relation Age of Onset  . Hypertension Mother   . Seizures Mother   . Hypertension Paternal Grandmother   . Hypertension Maternal Grandmother     Social History Social History  Substance Use Topics  . Smoking status: Never Smoker  . Smokeless tobacco: Never Used  . Alcohol use No     Allergies   Review of patient's allergies indicates no known allergies.   Review of Systems Review of Systems  Constitutional: Negative for activity change, appetite change, chills and fever.  Respiratory: Negative for chest tightness and shortness of breath.   Gastrointestinal: Negative for abdominal pain, nausea and vomiting.  Genitourinary: Positive for dysuria and urgency. Negative for decreased urine volume, difficulty urinating, flank pain, frequency, hematuria, vaginal bleeding and vaginal discharge.  Musculoskeletal: Negative for back pain.  Skin: Negative for rash.  Neurological: Negative for dizziness, weakness and numbness.  Hematological: Negative for adenopathy.  Psychiatric/Behavioral: Negative for confusion.  All other systems reviewed and are negative.    Physical Exam Updated Vital Signs BP 128/82 (BP Location: Right Arm)  Pulse 80   Temp 98.2 F (36.8 C) (Oral)   Resp 12   Ht 5\' 3"  (1.6 m)   Wt 79.4 kg   SpO2 100%   BMI 31.00 kg/m   Physical Exam  Constitutional: She is oriented to person, place, and time. She appears well-developed and well-nourished. No distress.  HENT:  Head: Normocephalic.  Mouth/Throat: Oropharynx is clear and moist.  Cardiovascular: Normal rate, regular rhythm and intact distal pulses.   No murmur heard. Pulmonary/Chest: Effort normal and breath sounds normal. No respiratory distress. She has no  wheezes. She has no rales.  Abdominal: Soft. Normal appearance. She exhibits no distension and no mass. There is no hepatosplenomegaly. There is tenderness in the suprapubic area. There is no rigidity, no rebound, no guarding, no CVA tenderness and no tenderness at McBurney's point.  Mild ttp of the suprapubic region.  Remaining abdomen is soft, non-tender without guarding or rebound tenderness. No CVA tenderness  Musculoskeletal: Normal range of motion. She exhibits no edema.  Neurological: She is alert and oriented to person, place, and time. Coordination normal.  Skin: Skin is warm and dry. No rash noted.  Nursing note and vitals reviewed.    ED Treatments / Results  Labs (all labs ordered are listed, but only abnormal results are displayed) Labs Reviewed  URINALYSIS, ROUTINE W REFLEX MICROSCOPIC (NOT AT [facility]) - Abnormal; Notable for the following:       Result Value   Hgb urine dipstick LARGE (*)    Protein, ur 100 (*)    Nitrite POSITIVE (*)    Leukocytes, UA LARGE (*)    All other components within normal limits  URINE MICROSCOPIC-ADD ON - Abnormal; Notable for the following:    Squamous Epithelial / LPF 0-5 (*)    Bacteria, UA MANY (*)    All other components within normal limits  URINE CULTURE  PREGNANCY, URINE    EKG  EKG Interpretation None       Radiology No results found.  Procedures Procedures (including critical care time)  Medications Ordered in ED Medications - No data to display   Initial Impression / Assessment and Plan / ED Course  I have reviewed the triage vital signs and the nursing notes.  Pertinent labs & imaging results that were available during my care of the patient were reviewed by me and considered in my medical decision making (see chart for details).  Clinical Course   Pt well appearing, vitals stable.  UTI w/o clinical evidence for pyelonephritis. urine culture pending.    Rx for keflex, return precautions given.      Final  Clinical Impressions(s) / ED Diagnoses   Final diagnoses:  Urinary tract infection, acute  Eczema    New Prescriptions Discharge Medication List as of 05/10/2016  6:52 PM    START taking these medications   Details  cephALEXin (KEFLEX) 500 MG capsule Take 1 capsule (500 mg total) by mouth 4 (four) times daily. For 7 days, Starting Wed 05/10/2016, Print    triamcinolone ointment (KENALOG) 0.1 % Apply 1 application topically 2 (two) times daily., Starting Wed 05/10/2016, Print         Vernel Donlan Tularosariplett, PA-C 05/11/16 [id number]    Raeford RazorStephen Kohut, MD 05/17/16 [phone]

## 2016-05-10 NOTE — ED Triage Notes (Signed)
Pt reports cloudy urine and strong odor to urine since Monday.  Also reports her eczema is flaring up.

## 2016-05-13 LAB — URINE CULTURE

## 2017-06-07 ENCOUNTER — Emergency Department (HOSPITAL_BASED_OUTPATIENT_CLINIC_OR_DEPARTMENT_OTHER)
Admission: EM | Admit: 2017-06-07 | Discharge: 2017-06-07 | Disposition: A | Discharge status: Home / Self Care | Payer: Self-pay | Attending: Emergency Medicine | Admitting: Emergency Medicine

## 2017-06-07 ENCOUNTER — Encounter (HOSPITAL_BASED_OUTPATIENT_CLINIC_OR_DEPARTMENT_OTHER): Payer: Self-pay | Admitting: *Deleted

## 2017-06-07 DIAGNOSIS — Y929 Unspecified place or not applicable: Secondary | ICD-10-CM | POA: Insufficient documentation

## 2017-06-07 DIAGNOSIS — Y939 Activity, unspecified: Secondary | ICD-10-CM | POA: Insufficient documentation

## 2017-06-07 DIAGNOSIS — S161XXA Strain of muscle, fascia and tendon at neck level, initial encounter: Secondary | ICD-10-CM | POA: Insufficient documentation

## 2017-06-07 DIAGNOSIS — Y999 Unspecified external cause status: Secondary | ICD-10-CM | POA: Insufficient documentation

## 2017-06-07 MED ORDER — ACETAMINOPHEN 500 MG PO TABS: 500.0000 mg | ORAL_TABLET | Freq: Four times a day (QID) | ORAL | 0 refills | Status: DC | PRN

## 2017-06-07 MED ORDER — IBUPROFEN 600 MG PO TABS: 600.0000 mg | ORAL_TABLET | Freq: Four times a day (QID) | ORAL | 0 refills | Status: DC | PRN

## 2017-06-07 MED ORDER — CYCLOBENZAPRINE HCL 10 MG PO TABS: 10.0000 mg | ORAL_TABLET | Freq: Two times a day (BID) | ORAL | 0 refills | Status: AC | PRN
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
## 2017-07-04 ENCOUNTER — Other Ambulatory Visit: Payer: Self-pay | Admitting: Advanced Practice Midwife

## 2017-07-25 ENCOUNTER — Ambulatory Visit (INDEPENDENT_AMBULATORY_CARE_PROVIDER_SITE_OTHER): Payer: Medicaid Other | Admitting: Advanced Practice Midwife

## 2017-07-25 ENCOUNTER — Other Ambulatory Visit (HOSPITAL_COMMUNITY)
Admission: RE | Admit: 2017-07-25 | Discharge: 2017-07-25 | Disposition: A | Discharge status: Home / Self Care | Payer: Medicaid Other | Source: Ambulatory Visit | Attending: Advanced Practice Midwife | Admitting: Advanced Practice Midwife

## 2017-07-25 ENCOUNTER — Other Ambulatory Visit: Payer: Self-pay

## 2017-07-25 ENCOUNTER — Encounter: Payer: Self-pay | Admitting: Advanced Practice Midwife

## 2017-07-25 VITALS — BP 104/60 | HR 78 | Ht 64.0 in | Wt 191.0 lb

## 2017-07-25 DIAGNOSIS — Z01419 Encounter for gynecological examination (general) (routine) without abnormal findings: Secondary | ICD-10-CM | POA: Insufficient documentation

## 2017-07-25 DIAGNOSIS — Z309 Encounter for contraceptive management, unspecified: Secondary | ICD-10-CM

## 2017-07-25 DIAGNOSIS — Z113 Encounter for screening for infections with a predominantly sexual mode of transmission: Secondary | ICD-10-CM

## 2017-07-25 MED ORDER — NORELGESTROMIN-ETH ESTRADIOL 150-35 MCG/24HR TD PTWK: 1.0000 | MEDICATED_PATCH | TRANSDERMAL | 12 refills | Status: DC

## 2017-07-25 NOTE — Progress Notes (Signed)
Amanda Perez 26 y.o.  Vitals:   07/25/17 1004  BP: 104/60  Pulse: 78     Filed Weights   07/25/17 1004  Weight: 191 lb (86.6 kg)    Past Medical History: Past Medical History:  Diagnosis Date  . HSV-2 (herpes simplex virus 2) infection   . Other disorder of menstruation and other abnormal bleeding from female genital tract 07/10/2013   Bleeding daily since IUD inserted  . Pregnant 12/29/2014    Past Surgical History: Past Surgical History:  Procedure Laterality Date  . NO PAST SURGERIES      Family History: Family History  Problem Relation Age of Onset  . Hypertension Mother   . Seizures Mother   . Hypertension Paternal Grandmother   . Hypertension Maternal Grandmother     Social History: Social History   Tobacco Use  . Smoking status: Never Smoker  . Smokeless tobacco: Never Used  Substance Use Topics  . Alcohol use: No  . Drug use: No    Allergies: No Known Allergies    Current Outpatient Medications:  .  cephALEXin (KEFLEX) 500 MG capsule, Take 1 capsule (500 mg total) by mouth 4 (four) times daily. For 7 days (Patient not taking: Reported on 07/25/2017), Disp: 28 capsule, Rfl: 0 .  ibuprofen (ADVIL,MOTRIN) 600 MG tablet, Take 1 tablet (600 mg total) by mouth every 6 (six) hours. (Patient not taking: Reported on 07/25/2017), Disp: 90 tablet, Rfl: 0 .  Iron-FA-B Cmp-C-Biot-Probiotic (FUSION PLUS) CAPS, Take 1 capsule by mouth See admin instructions. 1 time daily between meals (Patient not taking: Reported on 07/25/2017), Disp: 30 capsule, Rfl: 6 .  norelgestromin-ethinyl estradiol (ORTHO EVRA) 150-35 MCG/24HR transdermal patch, Place 1 patch once a week onto the skin., Disp: 3 patch, Rfl: 12 .  Prenatal Vit-Fe Fumarate-FA (PRENATAL MULTIVITAMIN) TABS tablet, Take 1 tablet by mouth at bedtime., Disp: , Rfl:  .  triamcinolone ointment (KENALOG) 0.1 %, Apply 1 application topically 2 (two) times daily. (Patient not taking: Reported on 07/25/2017), Disp: 80  g, Rfl: 0  History of Present Illness: Here for pap and physical Last pap 2015, normal. Has nexplanon, but has gained weight and wants to change. Doesn't want IUD. Will try patch.    Review of Systems   Patient denies any headaches, blurred vision, shortness of breath, chest pain, abdominal pain, problems with bowel movements, urination, or intercourse.   Physical Exam: General:  Well developed, well nourished, no acute distress Skin:  Warm and dry Neck:  Midline trachea, normal thyroid Lungs; Clear to auscultation bilaterally Breast:  No dominant palpable mass, retraction, or nipple discharge Cardiovascular: Regular rate and rhythm Abdomen:  Soft, non tender, no hepatosplenomegaly Pelvic:  External genitalia is normal in appearance.  The vagina is normal in appearance.  The cervix is bulbous.  Uterus is felt to be normal size, shape, and contour.  No adnexal masses or tenderness noted.  Extremities:  No swelling or varicosities noted Psych:  No mood changes.     Impression: normal GYN exam Weight gain w/Nexplanon     Plan: Start ortho evra now, remove Nexplanon in 3 weeks

## 2017-07-26 LAB — CYTOLOGY - PAP
Adequacy: ABSENT
CHLAMYDIA, DNA PROBE: NEGATIVE
DIAGNOSIS: NEGATIVE
NEISSERIA GONORRHEA: NEGATIVE

## 2017-07-26 LAB — RPR: RPR Ser Ql: NONREACTIVE

## 2017-07-26 LAB — HIV ANTIBODY (ROUTINE TESTING W REFLEX): HIV Screen 4th Generation wRfx: NONREACTIVE

## 2017-08-15 ENCOUNTER — Encounter: Payer: Self-pay | Admitting: Advanced Practice Midwife

## 2017-08-15 ENCOUNTER — Ambulatory Visit (INDEPENDENT_AMBULATORY_CARE_PROVIDER_SITE_OTHER): Payer: Medicaid Other | Admitting: Advanced Practice Midwife

## 2017-08-15 VITALS — BP 124/80 | HR 78 | Ht 64.0 in | Wt 192.0 lb

## 2017-08-15 DIAGNOSIS — Z3049 Encounter for surveillance of other contraceptives: Secondary | ICD-10-CM | POA: Diagnosis not present

## 2017-08-15 DIAGNOSIS — Z3046 Encounter for surveillance of implantable subdermal contraceptive: Secondary | ICD-10-CM

## 2017-08-16 DIAGNOSIS — Z3046 Encounter for surveillance of implantable subdermal contraceptive: Secondary | ICD-10-CM | POA: Insufficient documentation

## 2017-08-16 NOTE — Progress Notes (Signed)
HPI:  Amanda SettlerBriana Kingsford 26 y.o. here for Nexplanon removal.  Her future plans for birth control are ortho evra.  She has been wearing it for the past 3 weeks and likes it.  Wants to stop nursing her 25 yo (only at night, "all night") really just using her for a pacifier.  He and his 314 yo sister still sleep in the bed with her.  Advised to stop BF and move to own bed at same time, he WILL cry about it.  Can also ask pediatrician for advice. .  Past Medical History: Past Medical History:  Diagnosis Date  . HSV-2 (herpes simplex virus 2) infection   . Other disorder of menstruation and other abnormal bleeding from female genital tract 07/10/2013   Bleeding daily since IUD inserted  . Pregnant 12/29/2014    Past Surgical History: Past Surgical History:  Procedure Laterality Date  . DILATION AND EVACUATION N/A 08/15/2015   Procedure: DILATATION and curretage, repair of laceration;  Surgeon: Tereso NewcomerUgonna A Anyanwu, MD;  Location: [facility];  Service: Gynecology;  Laterality: N/A;  . NO PAST SURGERIES      Family History: Family History  Problem Relation Age of Onset  . Hypertension Mother   . Seizures Mother   . Hypertension Paternal Grandmother   . Hypertension Maternal Grandmother     Social History: Social History   Tobacco Use  . Smoking status: Never Smoker  . Smokeless tobacco: Never Used  Substance Use Topics  . Alcohol use: No  . Drug use: No    Allergies: No Known Allergies  Meds:  (Not in a hospital admission)    Patient given informed consent for removal of her Nexplanon, time out was performed.  Signed copy in the chart.  Appropriate time out taken. Implanon site identified.  Area prepped in usual sterile fashon. One cc of 1% lidocaine was used to anesthetize the area at the distal end of the implant. A small stab incision was made right beside the implant on the distal portion.  The Nexplanon rod was grasped using hemostats and removed without difficulty.  There was less than 3  cc blood loss. There were no complications.  A small amount of antibiotic ointment and steri-strips were applied over the small incision.  A pressure bandage was applied to reduce any bruising.  The patient tolerated the procedure well and was given post procedure instructions.

## 2017-11-16 DIAGNOSIS — H52221 Regular astigmatism, right eye: Secondary | ICD-10-CM | POA: Diagnosis not present

## 2017-11-16 DIAGNOSIS — H5213 Myopia, bilateral: Secondary | ICD-10-CM | POA: Diagnosis not present

## 2018-01-03 ENCOUNTER — Ambulatory Visit: Payer: Medicaid Other | Admitting: Women's Health

## 2018-04-11 DIAGNOSIS — L700 Acne vulgaris: Secondary | ICD-10-CM | POA: Diagnosis not present

## 2018-04-11 DIAGNOSIS — L2084 Intrinsic (allergic) eczema: Secondary | ICD-10-CM | POA: Diagnosis not present

## 2018-05-31 ENCOUNTER — Encounter: Payer: Self-pay | Admitting: Adult Health

## 2018-05-31 ENCOUNTER — Ambulatory Visit: Payer: Medicaid Other | Admitting: Adult Health

## 2018-05-31 ENCOUNTER — Other Ambulatory Visit: Payer: Self-pay

## 2018-05-31 VITALS — BP 130/73 | HR 85 | Ht 64.5 in | Wt 206.0 lb

## 2018-05-31 DIAGNOSIS — N926 Irregular menstruation, unspecified: Secondary | ICD-10-CM | POA: Diagnosis not present

## 2018-05-31 DIAGNOSIS — Z3A01 Less than 8 weeks gestation of pregnancy: Secondary | ICD-10-CM

## 2018-05-31 DIAGNOSIS — O3680X0 Pregnancy with inconclusive fetal viability, not applicable or unspecified: Secondary | ICD-10-CM

## 2018-05-31 DIAGNOSIS — Z3201 Encounter for pregnancy test, result positive: Secondary | ICD-10-CM | POA: Diagnosis not present

## 2018-05-31 LAB — POCT URINE PREGNANCY: PREG TEST UR: POSITIVE — AB

## 2018-05-31 NOTE — Progress Notes (Signed)
  Subjective:     Patient ID: Sharion SettlerBriana Fetterman, female   DOB: 11/15/1990, 27 y.o.   MRN: [id number]  HPI [name] is a 27 year old black female in for UPT, has missed a period and had 3+HPTs.  Review of Systems +missed period, with 3+HPTs Denies any nausea/vomiting or bleeding Reviewed past medical,surgical, social and family history. Reviewed medications and allergies.     Objective:   Physical Exam BP 130/73 (BP Location: Right Arm, Patient Position: Sitting, Cuff Size: Normal)   Pulse 85   Ht 5' 4.5" (1.638 m)   Wt 206 lb (93.4 kg)   LMP 04/26/2018   BMI 34.81 kg/m UPT+, about 5 weeks by LMP with EDD 01/31/19.Skin warm and dry. Neck: mid line trachea, normal thyroid, good ROM, no lymphadenopathy noted. Lungs: clear to ausculation bilaterally. Cardiovascular: regular rate and rhythm. Abdomen is soft and non tender.     Assessment:     1. Positive pregnancy test   2. Less than [redacted] weeks gestation of pregnancy   3. Encounter to determine fetal viability of pregnancy, single or unspecified fetus       Plan:     Continue PNV Dating [facility] 10/4 at [facility]  Return in 4 weeks for new OB Review handouts on First trimester and by [facility]

## 2018-05-31 NOTE — Patient Instructions (Signed)
First Trimester of Pregnancy The first trimester of pregnancy is from week 1 until the end of week 13 (months 1 through 3). A week after a sperm fertilizes an egg, the egg will implant on the wall of the uterus. This embryo will begin to develop into a baby. Genes from you and your partner will form the baby. The female genes will determine whether the baby will be a boy or a girl. At 6-8 weeks, the eyes and face will be formed, and the heartbeat can be seen on ultrasound. At the end of 12 weeks, all the baby's organs will be formed. Now that you are pregnant, you will want to do everything you can to have a healthy baby. Two of the most important things are to get good prenatal care and to follow your health care provider's instructions. Prenatal care is all the medical care you receive before the baby's birth. This care will help prevent, find, and treat any problems during the pregnancy and childbirth. Body changes during your first trimester Your body goes through many changes during pregnancy. The changes vary from woman to woman.  You may gain or lose a couple of pounds at first.  You may feel sick to your stomach (nauseous) and you may throw up (vomit). If the vomiting is uncontrollable, call your health care provider.  You may tire easily.  You may develop headaches that can be relieved by medicines. All medicines should be approved by your health care provider.  You may urinate more often. Painful urination may mean you have a bladder infection.  You may develop heartburn as a result of your pregnancy.  You may develop constipation because certain hormones are causing the muscles that push stool through your intestines to slow down.  You may develop hemorrhoids or swollen veins (varicose veins).  Your breasts may begin to grow larger and become tender. Your nipples may stick out more, and the tissue that surrounds them (areola) may become darker.  Your gums may bleed and may be  sensitive to brushing and flossing.  Dark spots or blotches (chloasma, mask of pregnancy) may develop on your face. This will likely fade after the baby is born.  Your menstrual periods will stop.  You may have a loss of appetite.  You may develop cravings for certain kinds of food.  You may have changes in your emotions from day to day, such as being excited to be pregnant or being concerned that something may go wrong with the pregnancy and baby.  You may have more vivid and strange dreams.  You may have changes in your hair. These can include thickening of your hair, rapid growth, and changes in texture. Some women also have hair loss during or after pregnancy, or hair that feels dry or thin. Your hair will most likely return to normal after your baby is born.  What to expect at prenatal visits During a routine prenatal visit:  You will be weighed to make sure you and the baby are growing normally.  Your blood pressure will be taken.  Your abdomen will be measured to track your baby's growth.  The fetal heartbeat will be listened to between weeks 10 and 14 of your pregnancy.  Test results from any previous visits will be discussed.  Your health care provider may ask you:  How you are feeling.  If you are feeling the baby move.  If you have had any abnormal symptoms, such as leaking fluid, bleeding, severe headaches,   or abdominal cramping.  If you are using any tobacco products, including cigarettes, chewing tobacco, and electronic cigarettes.  If you have any questions.  Other tests that may be performed during your first trimester include:  Blood tests to find your blood type and to check for the presence of any previous infections. The tests will also be used to check for low iron levels (anemia) and protein on red blood cells (Rh antibodies). Depending on your risk factors, or if you previously had diabetes during pregnancy, you may have tests to check for high blood  sugar that affects pregnant women (gestational diabetes).  Urine tests to check for infections, diabetes, or protein in the urine.  An ultrasound to confirm the proper growth and development of the baby.  Fetal screens for spinal cord problems (spina bifida) and Down syndrome.  HIV (human immunodeficiency virus) testing. Routine prenatal testing includes screening for HIV, unless you choose not to have this test.  You may need other tests to make sure you and the baby are doing well.  Follow these instructions at home: Medicines  Follow your health care provider's instructions regarding medicine use. Specific medicines may be either safe or unsafe to take during pregnancy.  Take a prenatal vitamin that contains at least 600 micrograms (mcg) of folic acid.  If you develop constipation, try taking a stool softener if your health care provider approves. Eating and drinking  Eat a balanced diet that includes fresh fruits and vegetables, whole grains, good sources of protein such as meat, eggs, or tofu, and low-fat dairy. Your health care provider will help you determine the amount of weight gain that is right for you.  Avoid raw meat and uncooked cheese. These carry germs that can cause birth defects in the baby.  Eating four or five small meals rather than three large meals a day may help relieve nausea and vomiting. If you start to feel nauseous, eating a few soda crackers can be helpful. Drinking liquids between meals, instead of during meals, also seems to help ease nausea and vomiting.  Limit foods that are high in fat and processed sugars, such as fried and sweet foods.  To prevent constipation: ? Eat foods that are high in fiber, such as fresh fruits and vegetables, whole grains, and beans. ? Drink enough fluid to keep your urine clear or pale yellow. Activity  Exercise only as directed by your health care provider. Most women can continue their usual exercise routine during  pregnancy. Try to exercise for 30 minutes at least 5 days a week. Exercising will help you: ? Control your weight. ? Stay in shape. ? Be prepared for labor and delivery.  Experiencing pain or cramping in the lower abdomen or lower back is a good sign that you should stop exercising. Check with your health care provider before continuing with normal exercises.  Try to avoid standing for long periods of time. Move your legs often if you must stand in one place for a long time.  Avoid heavy lifting.  Wear low-heeled shoes and practice good posture.  You may continue to have sex unless your health care provider tells you not to. Relieving pain and discomfort  Wear a good support bra to relieve breast tenderness.  Take warm sitz baths to soothe any pain or discomfort caused by hemorrhoids. Use hemorrhoid cream if your health care provider approves.  Rest with your legs elevated if you have leg cramps or low back pain.  If you develop   varicose veins in your legs, wear support hose. Elevate your feet for 15 minutes, 3-4 times a day. Limit salt in your diet. Prenatal care  Schedule your prenatal visits by the twelfth week of pregnancy. They are usually scheduled monthly at first, then more often in the last 2 months before delivery.  Write down your questions. Take them to your prenatal visits.  Keep all your prenatal visits as told by your health care provider. This is important. Safety  Wear your seat belt at all times when driving.  Make a list of emergency phone numbers, including numbers for family, friends, the hospital, and police and fire departments. General instructions  Ask your health care provider for a referral to a local prenatal education class. Begin classes no later than the beginning of month 6 of your pregnancy.  Ask for help if you have counseling or nutritional needs during pregnancy. Your health care provider can offer advice or refer you to specialists for help  with various needs.  Do not use hot tubs, steam rooms, or saunas.  Do not douche or use tampons or scented sanitary pads.  Do not cross your legs for long periods of time.  Avoid cat litter boxes and soil used by cats. These carry germs that can cause birth defects in the baby and possibly loss of the fetus by miscarriage or stillbirth.  Avoid all smoking, herbs, alcohol, and medicines not prescribed by your health care provider. Chemicals in these products affect the formation and growth of the baby.  Do not use any products that contain nicotine or tobacco, such as cigarettes and e-cigarettes. If you need help quitting, ask your health care provider. You may receive counseling support and other resources to help you quit.  Schedule a dentist appointment. At home, brush your teeth with a soft toothbrush and be gentle when you floss. Contact a health care provider if:  You have dizziness.  You have mild pelvic cramps, pelvic pressure, or nagging pain in the abdominal area.  You have persistent nausea, vomiting, or diarrhea.  You have a bad smelling vaginal discharge.  You have pain when you urinate.  You notice increased swelling in your face, hands, legs, or ankles.  You are exposed to fifth disease or chickenpox.  You are exposed to German measles (rubella) and have never had it. Get help right away if:  You have a fever.  You are leaking fluid from your vagina.  You have spotting or bleeding from your vagina.  You have severe abdominal cramping or pain.  You have rapid weight gain or loss.  You vomit blood or material that looks like coffee grounds.  You develop a severe headache.  You have shortness of breath.  You have any kind of trauma, such as from a fall or a car accident. Summary  The first trimester of pregnancy is from week 1 until the end of week 13 (months 1 through 3).  Your body goes through many changes during pregnancy. The changes vary from  woman to woman.  You will have routine prenatal visits. During those visits, your health care provider will examine you, discuss any test results you may have, and talk with you about how you are feeling. This information is not intended to replace advice given to you by your health care provider. Make sure you discuss any questions you have with your health care provider. Document Released: 08/22/2001 Document Revised: 08/09/2016 Document Reviewed: 08/09/2016 [employer] Interactive Patient Education  2018 [employer]  

## 2018-06-14 ENCOUNTER — Other Ambulatory Visit: Payer: Self-pay | Admitting: Adult Health

## 2018-06-14 ENCOUNTER — Ambulatory Visit (HOSPITAL_COMMUNITY)
Admission: RE | Admit: 2018-06-14 | Discharge: 2018-06-14 | Disposition: A | Discharge status: Home / Self Care | Payer: Medicaid Other | Source: Ambulatory Visit | Attending: Adult Health | Admitting: Adult Health

## 2018-06-14 DIAGNOSIS — O26841 Uterine size-date discrepancy, first trimester: Secondary | ICD-10-CM | POA: Diagnosis not present

## 2018-06-14 DIAGNOSIS — O30041 Twin pregnancy, dichorionic/diamniotic, first trimester: Secondary | ICD-10-CM | POA: Insufficient documentation

## 2018-06-14 DIAGNOSIS — Z3A08 8 weeks gestation of pregnancy: Secondary | ICD-10-CM | POA: Diagnosis not present

## 2018-06-14 DIAGNOSIS — O3680X0 Pregnancy with inconclusive fetal viability, not applicable or unspecified: Secondary | ICD-10-CM | POA: Diagnosis present

## 2018-06-28 ENCOUNTER — Ambulatory Visit (INDEPENDENT_AMBULATORY_CARE_PROVIDER_SITE_OTHER): Payer: Medicaid Other | Admitting: Women's Health

## 2018-06-28 ENCOUNTER — Encounter: Payer: Self-pay | Admitting: Women's Health

## 2018-06-28 ENCOUNTER — Ambulatory Visit: Payer: Medicaid Other | Admitting: *Deleted

## 2018-06-28 VITALS — BP 133/87 | HR 93 | Wt 203.0 lb

## 2018-06-28 DIAGNOSIS — O09299 Supervision of pregnancy with other poor reproductive or obstetric history, unspecified trimester: Secondary | ICD-10-CM | POA: Insufficient documentation

## 2018-06-28 DIAGNOSIS — O0990 Supervision of high risk pregnancy, unspecified, unspecified trimester: Secondary | ICD-10-CM | POA: Insufficient documentation

## 2018-06-28 DIAGNOSIS — O98511 Other viral diseases complicating pregnancy, first trimester: Secondary | ICD-10-CM

## 2018-06-28 DIAGNOSIS — O0991 Supervision of high risk pregnancy, unspecified, first trimester: Secondary | ICD-10-CM

## 2018-06-28 DIAGNOSIS — B009 Herpesviral infection, unspecified: Secondary | ICD-10-CM

## 2018-06-28 DIAGNOSIS — Z23 Encounter for immunization: Secondary | ICD-10-CM

## 2018-06-28 DIAGNOSIS — O2341 Unspecified infection of urinary tract in pregnancy, first trimester: Secondary | ICD-10-CM | POA: Insufficient documentation

## 2018-06-28 DIAGNOSIS — Z3A09 9 weeks gestation of pregnancy: Secondary | ICD-10-CM | POA: Diagnosis not present

## 2018-06-28 DIAGNOSIS — O30049 Twin pregnancy, dichorionic/diamniotic, unspecified trimester: Secondary | ICD-10-CM | POA: Insufficient documentation

## 2018-06-28 DIAGNOSIS — O30041 Twin pregnancy, dichorionic/diamniotic, first trimester: Secondary | ICD-10-CM

## 2018-06-28 DIAGNOSIS — Z3682 Encounter for antenatal screening for nuchal translucency: Secondary | ICD-10-CM

## 2018-06-28 DIAGNOSIS — O09291 Supervision of pregnancy with other poor reproductive or obstetric history, first trimester: Secondary | ICD-10-CM

## 2018-06-28 DIAGNOSIS — Z331 Pregnant state, incidental: Secondary | ICD-10-CM

## 2018-06-28 DIAGNOSIS — Z1389 Encounter for screening for other disorder: Secondary | ICD-10-CM

## 2018-06-28 LAB — POCT URINALYSIS DIPSTICK OB
Blood, UA: NEGATIVE
GLUCOSE, UA: NEGATIVE
KETONES UA: NEGATIVE
Leukocytes, UA: NEGATIVE
Nitrite, UA: POSITIVE

## 2018-06-28 MED ORDER — DOXYLAMINE-PYRIDOXINE 10-10 MG PO TBEC: DELAYED_RELEASE_TABLET | ORAL | 6 refills | Status: DC

## 2018-06-28 MED ORDER — CEPHALEXIN 500 MG PO CAPS: 500.0000 mg | ORAL_CAPSULE | Freq: Four times a day (QID) | ORAL | 0 refills | Status: DC

## 2018-06-28 MED ORDER — CITRANATAL ASSURE 35-1 & 300 MG PO MISC
ORAL | 11 refills | Status: DC
Start: 2018-06-28 — End: 2019-11-03

## 2018-06-28 NOTE — Patient Instructions (Addendum)
Amanda Perez, I greatly value your feedback.  If you receive a survey following your visit with [place] today, we appreciate you taking the time to fill it out.  Thanks, Joellyn Haff, CNM, WHNP-BC  Begin taking a 81mg  baby aspirin daily at 12 weeks of pregnancy (11/8)  to decrease risk of preeclampsia during pregnancy    Nausea & Vomiting  Have saltine crackers or pretzels by your bed and eat a few bites before you raise your head out of bed in the morning  Eat small frequent meals throughout the day instead of large meals  Drink plenty of fluids throughout the day to stay hydrated, just don't drink a lot of fluids with your meals.  This can make your stomach fill up faster making you feel sick  Do not brush your teeth right after you eat  Products with real ginger are good for nausea, like ginger ale and ginger hard candy Make sure it says made with real ginger!  Sucking on sour candy like lemon heads is also good for nausea  If your prenatal vitamins make you nauseated, take them at night so you will sleep through the nausea  Sea Bands  If you feel like you need medicine for the nausea & vomiting please let us know  If you are unable to keep any fluids or food down please let us know   Constipation  Drink plenty of fluid, preferably water, throughout the day  Eat foods high in fiber such as fruits, vegetables, and grains  Exercise, such as walking, is a good way to keep your bowels regular  Drink warm fluids, especially warm prune juice, or decaf coffee  Eat a 1/2 cup of real oatmeal (not instant), 1/2 cup applesauce, and 1/2-1 cup warm prune juice every day  If needed, you may take Colace (docusate sodium) stool softener once or twice a day to help keep the stool soft. If you are pregnant, wait until you are out of your first trimester (12-14 weeks of pregnancy)  If you still are having problems with constipation, you may take Miralax once daily as needed to help keep your  bowels regular.  If you are pregnant, wait until you are out of your first trimester (12-14 weeks of pregnancy)   First Trimester of Pregnancy The first trimester of pregnancy is from week 1 until the end of week 12 (months 1 through 3). A week after a sperm fertilizes an egg, the egg will implant on the wall of the uterus. This embryo will begin to develop into a baby. Genes from you and your partner are forming the baby. The female genes determine whether the baby is a boy or a girl. At 6-8 weeks, the eyes and face are formed, and the heartbeat can be seen on ultrasound. At the end of 12 weeks, all the baby's organs are formed.  Now that you are pregnant, you will want to do everything you can to have a healthy baby. Two of the most important things are to get good prenatal care and to follow your health care provider's instructions. Prenatal care is all the medical care you receive before the baby's birth. This care will help prevent, find, and treat any problems during the pregnancy and childbirth. BODY CHANGES Your body goes through many changes during pregnancy. The changes vary from woman to woman.   You may gain or lose a couple of pounds at first.  You may feel sick to your stomach (nauseous) and throw  up (vomit). If the vomiting is uncontrollable, call your health care provider.  You may tire easily.  You may develop headaches that can be relieved by medicines approved by your health care provider.  You may urinate more often. Painful urination may mean you have a bladder infection.  You may develop heartburn as a result of your pregnancy.  You may develop constipation because certain hormones are causing the muscles that push waste through your intestines to slow down.  You may develop hemorrhoids or swollen, bulging veins (varicose veins).  Your breasts may begin to grow larger and become tender. Your nipples may stick out more, and the tissue that surrounds them (areola) may become  darker.  Your gums may bleed and may be sensitive to brushing and flossing.  Dark spots or blotches (chloasma, mask of pregnancy) may develop on your face. This will likely fade after the baby is born.  Your menstrual periods will stop.  You may have a loss of appetite.  You may develop cravings for certain kinds of food.  You may have changes in your emotions from day to day, such as being excited to be pregnant or being concerned that something may go wrong with the pregnancy and baby.  You may have more vivid and strange dreams.  You may have changes in your hair. These can include thickening of your hair, rapid growth, and changes in texture. Some women also have hair loss during or after pregnancy, or hair that feels dry or thin. Your hair will most likely return to normal after your baby is born. WHAT TO EXPECT AT YOUR PRENATAL VISITS During a routine prenatal visit:  You will be weighed to make sure you and the baby are growing normally.  Your blood pressure will be taken.  Your abdomen will be measured to track your baby's growth.  The fetal heartbeat will be listened to starting around week 10 or 12 of your pregnancy.  Test results from any previous visits will be discussed. Your health care provider may ask you:  How you are feeling.  If you are feeling the baby move.  If you have had any abnormal symptoms, such as leaking fluid, bleeding, severe headaches, or abdominal cramping.  If you have any questions. Other tests that may be performed during your first trimester include:  Blood tests to find your blood type and to check for the presence of any previous infections. They will also be used to check for low iron levels (anemia) and Rh antibodies. Later in the pregnancy, blood tests for diabetes will be done along with other tests if problems develop.  Urine tests to check for infections, diabetes, or protein in the urine.  An ultrasound to confirm the proper  growth and development of the baby.  An amniocentesis to check for possible genetic problems.  Fetal screens for spina bifida and Down syndrome.  You may need other tests to make sure you and the baby are doing well. HOME CARE INSTRUCTIONS  Medicines  Follow your health care provider's instructions regarding medicine use. Specific medicines may be either safe or unsafe to take during pregnancy.  Take your prenatal vitamins as directed.  If you develop constipation, try taking a stool softener if your health care provider approves. Diet  Eat regular, well-balanced meals. Choose a variety of foods, such as meat or vegetable-based protein, fish, milk and low-fat dairy products, vegetables, fruits, and whole grain breads and cereals. Your health care provider will help you determine  the amount of weight gain that is right for you.  Avoid raw meat and uncooked cheese. These carry germs that can cause birth defects in the baby.  Eating four or five small meals rather than three large meals a day may help relieve nausea and vomiting. If you start to feel nauseous, eating a few soda crackers can be helpful. Drinking liquids between meals instead of during meals also seems to help nausea and vomiting.  If you develop constipation, eat more high-fiber foods, such as fresh vegetables or fruit and whole grains. Drink enough fluids to keep your urine clear or pale yellow. Activity and Exercise  Exercise only as directed by your health care provider. Exercising will help you:  Control your weight.  Stay in shape.  Be prepared for labor and delivery.  Experiencing pain or cramping in the lower abdomen or low back is a good sign that you should stop exercising. Check with your health care provider before continuing normal exercises.  Try to avoid standing for long periods of time. Move your legs often if you must stand in one place for a long time.  Avoid heavy lifting.  Wear low-heeled  shoes, and practice good posture.  You may continue to have sex unless your health care provider directs you otherwise. Relief of Pain or Discomfort  Wear a good support bra for breast tenderness.   Take warm sitz baths to soothe any pain or discomfort caused by hemorrhoids. Use hemorrhoid cream if your health care provider approves.   Rest with your legs elevated if you have leg cramps or low back pain.  If you develop varicose veins in your legs, wear support hose. Elevate your feet for 15 minutes, 3-4 times a day. Limit salt in your diet. Prenatal Care  Schedule your prenatal visits by the twelfth week of pregnancy. They are usually scheduled monthly at first, then more often in the last 2 months before delivery.  Write down your questions. Take them to your prenatal visits.  Keep all your prenatal visits as directed by your health care provider. Safety  Wear your seat belt at all times when driving.  Make a list of emergency phone numbers, including numbers for family, friends, the hospital, and police and fire departments. General Tips  Ask your health care provider for a referral to a local prenatal education class. Begin classes no later than at the beginning of month 6 of your pregnancy.  Ask for help if you have counseling or nutritional needs during pregnancy. Your health care provider can offer advice or refer you to specialists for help with various needs.  Do not use hot tubs, steam rooms, or saunas.  Do not douche or use tampons or scented sanitary pads.  Do not cross your legs for long periods of time.  Avoid cat litter boxes and soil used by cats. These carry germs that can cause birth defects in the baby and possibly loss of the fetus by miscarriage or stillbirth.  Avoid all smoking, herbs, alcohol, and medicines not prescribed by your health care provider. Chemicals in these affect the formation and growth of the baby.  Schedule a dentist appointment. At  home, brush your teeth with a soft toothbrush and be gentle when you floss. SEEK MEDICAL CARE IF:   You have dizziness.  You have mild pelvic cramps, pelvic pressure, or nagging pain in the abdominal area.  You have persistent nausea, vomiting, or diarrhea.  You have a bad smelling vaginal discharge.  You have pain with urination.  You notice increased swelling in your face, hands, legs, or ankles. SEEK IMMEDIATE MEDICAL CARE IF:   You have a fever.  You are leaking fluid from your vagina.  You have spotting or bleeding from your vagina.  You have severe abdominal cramping or pain.  You have rapid weight gain or loss.  You vomit blood or material that looks like coffee grounds.  You are exposed to [place] measles and have never had them.  You are exposed to fifth disease or chickenpox.  You develop a severe headache.  You have shortness of breath.  You have any kind of trauma, such as from a fall or a car accident. Document Released: 08/22/2001 Document Revised: 01/12/2014 Document Reviewed: 07/08/2013 [facility] Patient Information 2015 [place], [place]. This information is not intended to replace advice given to you by your health care provider. Make sure you discuss any questions you have with your health care provider.

## 2018-06-28 NOTE — Progress Notes (Signed)
INITIAL OBSTETRICAL VISIT Patient name: Amanda Perez MRN [id number]  Date of birth: [birth year] Chief Complaint:   Initial Prenatal Visit (nausea)  History of Present Illness:   Amanda Perez is a 27 y.o. G73P2002 African American female at [redacted]w[redacted]d by LMP c/w 7wk u/s, with an Estimated Date of Delivery: 01/31/19 being seen today for her initial obstetrical visit.   Her obstetrical history is significant for term SVB x 2, PPH after 2nd w/ D&C,Bakri balloon, PRBC/FFP.  Some odor to urine and cloudy urine, w/ dysuria x 1 after sex.  Today she reports nausea- requests meds.  Patient's last menstrual period was 04/26/2018. Last pap 07/25/17. Results were: normal Review of Systems:   Pertinent items are noted in HPI Denies cramping/contractions, leakage of fluid, vaginal bleeding, abnormal vaginal discharge w/ itching/odor/irritation, headaches, visual changes, shortness of breath, chest pain, abdominal pain, severe nausea/vomiting, or problems with urination or bowel movements unless otherwise stated above.  Pertinent History Reviewed:  Reviewed past medical,surgical, social, obstetrical and family history.  Reviewed problem list, medications and allergies. OB History  Gravida Para Term Preterm AB Living  3 2 2     2   SAB TAB Ectopic Multiple Live Births        0 2    # Outcome Date GA Lbr Len/2nd Weight Sex Delivery Anes PTL Lv  3 Current           2 Term 08/15/15 [redacted]w[redacted]d 02:59 / 00:06 8 lb 3.4 oz (3.725 kg) M Vag-Spont None N LIV  1 Term 05/13/13 [redacted]w[redacted]d 19:16 / 02:00 7 lb 8.1 oz (3.405 kg) F Vag-Spont EPI N LIV   Physical Assessment:   Vitals:   06/28/18 0949  BP: 133/87  Pulse: 93  Weight: 203 lb (92.1 kg)  Body mass index is 34.31 kg/m.       Physical Examination:  General appearance - well appearing, and in no distress  Mental status - alert, oriented to person, place, and time  Psych:  She has a normal mood and affect  Skin - warm and dry, normal color, no suspicious lesions  noted  Chest - effort normal, all lung fields clear to auscultation bilaterally  Heart - normal rate and regular rhythm  Abdomen - soft, nontender  Extremities:  No swelling or varicosities noted  Thin prep pap is not done   Fetal Heart Rate (bpm): + x 2 u/s via informal transabdominal u/s  Results for orders placed or performed in visit on 06/28/18 (from the past 24 hour(s))  POC Urinalysis Dipstick OB   Collection Time: 06/28/18  9:49 AM  Result Value Ref Range   Color, UA     Clarity, UA     Glucose, UA Negative Negative   Bilirubin, UA     Ketones, UA neg    Spec Grav, UA     Blood, UA neg    pH, UA     POC Protein UA Trace Negative, Trace   Urobilinogen, UA     Nitrite, UA positive    Leukocytes, UA Negative Negative   Appearance     Odor      Assessment & Plan:  1) High-Risk Pregnancy G3P2002 at [redacted]w[redacted]d with an Estimated Date of Delivery: 01/31/19   2) Initial OB visit  3) Di-Di twins> baby ASA daily @ 12wks  4) H/O PPH> w/ D&C, Bakri balloon, PRBC/FFP  5) UTI> + nitrates, some odor to urine per pt, rx keflex, send cx  Meds:  Meds ordered this  encounter  Medications  . cephALEXin (KEFLEX) 500 MG capsule    Sig: Take 1 capsule (500 mg total) by mouth 4 (four) times daily. X 7 days    Dispense:  28 capsule    Refill:  0    Order Specific Question:   Supervising Provider    Answer:   EURE, LUTHER H [[id number]]  . Doxylamine-Pyridoxine (DICLEGIS) 10-10 MG TBEC    Sig: 2 tabs q hs, if sx persist add 1 tab q am on day 3, if sx persist add 1 tab q afternoon on day 4    Dispense:  100 tablet    Refill:  6    Order Specific Question:   Supervising Provider    Answer:   Despina Hidden, LUTHER H [[id number]]  . Prenat w/o A-FeCbGl-DSS-FA-DHA (CITRANATAL ASSURE) 35-1 & 300 MG tablet    Sig: One tablet and one capsule daily    Dispense:  60 tablet    Refill:  11    Order Specific Question:   Supervising Provider    Answer:   Duane Lope H [[id number]]    Initial labs obtained Continue  prenatal vitamins Reviewed n/v relief measures and warning s/s to report Reviewed recommended weight gain based on pre-gravid BMI Encouraged well-balanced diet Genetic Screening discussed Integrated Screen: requested Cystic fibrosis screening discussed neg prev preg Ultrasound discussed; fetal survey: requested CCNC completed>PCM not here Flu shot today  Follow-up: Return in about 4 weeks (around 07/26/2018) for HROB, Twins, US:NT+1stIT.   Orders Placed This Encounter  Procedures  . GC/Chlamydia Probe Amp  . Urine Culture  . US Fetal Nuchal Translucency Measurement  . US Fetal Nuchal Trans AddL Gest  . Flu Vaccine QUAD 36+ mos IM  . Urinalysis, Routine w reflex microscopic  . Obstetric Panel, Including HIV  . Pain Management Screening Profile (10S)  . POC Urinalysis Dipstick OB    Cheral Marker CNM, [facility] 06/28/2018 10:56 AM

## 2018-06-29 LAB — MED LIST OPTION NOT SELECTED

## 2018-06-30 LAB — GC/CHLAMYDIA PROBE AMP
CHLAMYDIA, DNA PROBE: NEGATIVE
Neisseria gonorrhoeae by PCR: NEGATIVE

## 2018-06-30 LAB — URINE CULTURE

## 2018-07-01 LAB — PMP SCREEN PROFILE (10S), URINE
Amphetamine Scrn, Ur: NEGATIVE ng/mL
BARBITURATE SCREEN URINE: NEGATIVE ng/mL
BENZODIAZEPINE SCREEN, URINE: NEGATIVE ng/mL
CANNABINOIDS UR QL SCN: NEGATIVE ng/mL
Cocaine (Metab) Scrn, Ur: NEGATIVE ng/mL
Creatinine(Crt), U: 143.2 mg/dL (ref 20.0–300.0)
Methadone Screen, Urine: NEGATIVE ng/mL
OXYCODONE+OXYMORPHONE UR QL SCN: NEGATIVE ng/mL
Opiate Scrn, Ur: NEGATIVE ng/mL
Ph of Urine: 6.9 (ref 4.5–8.9)
Phencyclidine Qn, Ur: NEGATIVE ng/mL
Propoxyphene Scrn, Ur: NEGATIVE ng/mL

## 2018-07-03 DIAGNOSIS — Z3A09 9 weeks gestation of pregnancy: Secondary | ICD-10-CM | POA: Diagnosis not present

## 2018-07-03 DIAGNOSIS — O0990 Supervision of high risk pregnancy, unspecified, unspecified trimester: Secondary | ICD-10-CM | POA: Diagnosis not present

## 2018-07-04 LAB — OBSTETRIC PANEL, INCLUDING HIV
Antibody Screen: NEGATIVE
BASOS ABS: 0 10*3/uL (ref 0.0–0.2)
Basos: 0 %
EOS (ABSOLUTE): 0.1 10*3/uL (ref 0.0–0.4)
Eos: 2 %
HIV Screen 4th Generation wRfx: NONREACTIVE
Hematocrit: 34.1 % (ref 34.0–46.6)
Hemoglobin: 11.5 g/dL (ref 11.1–15.9)
Hepatitis B Surface Ag: NEGATIVE
IMMATURE GRANULOCYTES: 0 %
Immature Grans (Abs): 0 10*3/uL (ref 0.0–0.1)
LYMPHS ABS: 1.5 10*3/uL (ref 0.7–3.1)
Lymphs: 26 %
MCH: 29 pg (ref 26.6–33.0)
MCHC: 33.7 g/dL (ref 31.5–35.7)
MCV: 86 fL (ref 79–97)
MONOCYTES: 7 %
MONOS ABS: 0.4 10*3/uL (ref 0.1–0.9)
Neutrophils Absolute: 3.6 10*3/uL (ref 1.4–7.0)
Neutrophils: 65 %
PLATELETS: 200 10*3/uL (ref 150–450)
RBC: 3.97 x10E6/uL (ref 3.77–5.28)
RDW: 11.8 % — AB (ref 12.3–15.4)
RPR: NONREACTIVE
Rh Factor: POSITIVE
Rubella Antibodies, IGG: 3.22 index (ref >0.99)
WBC: 5.6 10*3/uL (ref 3.4–10.8)

## 2018-07-04 LAB — URINALYSIS, ROUTINE W REFLEX MICROSCOPIC
BILIRUBIN UA: NEGATIVE
Glucose, UA: NEGATIVE
Ketones, UA: NEGATIVE
Nitrite, UA: NEGATIVE
PH UA: 6 (ref 5.0–7.5)
PROTEIN UA: NEGATIVE
RBC UA: NEGATIVE
SPEC GRAV UA: 1.019 (ref 1.005–1.030)
Urobilinogen, Ur: 0.2 mg/dL (ref 0.2–1.0)

## 2018-07-04 LAB — MICROSCOPIC EXAMINATION
Casts: NONE SEEN /lpf
Epithelial Cells (non renal): >10 /hpf — AB (ref 0–10)

## 2018-07-05 ENCOUNTER — Other Ambulatory Visit: Payer: Self-pay

## 2018-07-05 ENCOUNTER — Encounter (HOSPITAL_COMMUNITY): Payer: Self-pay

## 2018-07-05 ENCOUNTER — Emergency Department (HOSPITAL_COMMUNITY)
Admission: EM | Admit: 2018-07-05 | Discharge: 2018-07-05 | Disposition: A | Discharge status: Home / Self Care | Payer: Medicaid Other | Attending: Emergency Medicine | Admitting: Emergency Medicine

## 2018-07-05 DIAGNOSIS — O200 Threatened abortion: Secondary | ICD-10-CM | POA: Insufficient documentation

## 2018-07-05 DIAGNOSIS — Z3A10 10 weeks gestation of pregnancy: Secondary | ICD-10-CM | POA: Insufficient documentation

## 2018-07-05 DIAGNOSIS — O209 Hemorrhage in early pregnancy, unspecified: Secondary | ICD-10-CM | POA: Diagnosis present

## 2018-07-05 LAB — URINALYSIS, ROUTINE W REFLEX MICROSCOPIC
Bacteria, UA: NONE SEEN
Bilirubin Urine: NEGATIVE
Glucose, UA: NEGATIVE mg/dL
Ketones, ur: NEGATIVE mg/dL
Nitrite: NEGATIVE
Protein, ur: NEGATIVE mg/dL
Specific Gravity, Urine: 1.011 (ref 1.005–1.030)
pH: 6 (ref 5.0–8.0)

## 2018-07-05 LAB — HCG, QUANTITATIVE, PREGNANCY: hCG, Beta Chain, Quant, S: 250375 m[IU]/mL — ABNORMAL HIGH (ref <5)

## 2018-07-05 NOTE — ED Triage Notes (Signed)
Pt reports some light vaginal bleeding noted this am when she wiped.  Pt states also light discomfort in vaginal area, itching and burning.

## 2018-07-05 NOTE — Discharge Instructions (Addendum)
Follow-up with [facility].  Call them to let them know about the spotting.  A quantitative human pregnancy hormone number was ordered today they will be to use that for follow-up to determine how well things are developing.

## 2018-07-05 NOTE — ED Provider Notes (Signed)
[facility] EMERGENCY DEPARTMENT Provider Note   CSN: [id number] Arrival date & time: 07/05/18  4098     History   Chief Complaint Chief Complaint  Patient presents with  . Vaginal Bleeding    10 weeks preg    HPI Amanda Perez is a 27 y.o. female.  Patient presents with the onset of some light vaginal bleeding noted this morning when she wiped.  No blood in the toilet water.  No passage of tissue.  Patient is currently being followed by [facility].  She is currently approximately 9 to [redacted] weeks pregnant.  Just seen in their office on October 18 had ultrasound which showed twin uterine pregnancies.  Patient is gravida 3 para 2.  Patient's blood count from review of records in the past is known to be a positive.  Patient's due date is Jan 29, 2019.  Patient is on prenatal vitamins.  Patient without any significant abdominal pain.  Ultrasound clearly showed viable pregnancy with both heartbeats present on the 18th.     Past Medical History:  Diagnosis Date  . HSV-2 (herpes simplex virus 2) infection   . Other disorder of menstruation and other abnormal bleeding from female genital tract 07/10/2013   Bleeding daily since IUD inserted  . Pregnant 12/29/2014  . Pregnant     Patient Active Problem List   Diagnosis Date Noted  . Supervision of high risk pregnancy, antepartum 06/28/2018  . Twin gestation, dichorionic diamniotic 06/28/2018  . UTI (urinary tract infection) during pregnancy, first trimester 06/28/2018  . History of postpartum hemorrhage, currently pregnant 06/28/2018  . Monilial vulvovaginitis 03/18/2015  . HSV-2 (herpes simplex virus 2) infection     Past Surgical History:  Procedure Laterality Date  . DILATION AND EVACUATION N/A 08/15/2015   Procedure: DILATATION and curretage, repair of laceration;  Surgeon: Tereso Newcomer, MD;  Location: [facility];  Service: Gynecology;  Laterality: N/A;     OB History    Gravida  3   Para  2   Term  2   Preterm      AB      Living  2     SAB      TAB      Ectopic      Multiple  0   Live Births  2            Home Medications    Prior to Admission medications   Medication Sig Start Date End Date Taking? Authorizing Provider  cephALEXin (KEFLEX) 500 MG capsule Take 1 capsule (500 mg total) by mouth 4 (four) times daily. X 7 days 06/28/18   Cheral Marker, CNM  Doxylamine-Pyridoxine (DICLEGIS) 10-10 MG TBEC 2 tabs q hs, if sx persist add 1 tab q am on day 3, if sx persist add 1 tab q afternoon on day 4 06/28/18   Cheral Marker, CNM  Prenat w/o A-FeCbGl-DSS-FA-DHA [facility] ASSURE) 35-1 & 300 MG tablet One tablet and one capsule daily 06/28/18   Cheral Marker, CNM  Prenatal Vit-Fe Fumarate-FA (PRENATAL MULTIVITAMIN) TABS tablet Take 1 tablet by mouth at bedtime.    [provider]    Family History Family History  Problem Relation Age of Onset  . Hypertension Mother   . Seizures Mother   . Hypertension Paternal Grandmother   . Hypertension Maternal Grandmother     Social History Social History   Tobacco Use  . Smoking status: Never Smoker  . Smokeless tobacco: Never Used  Substance Use Topics  . Alcohol use: Not Currently    Frequency: Never  . Drug use: No     Allergies   Patient has no known allergies.   Review of Systems Review of Systems  Constitutional: Negative for fever.  HENT: Negative for congestion.   Eyes: Negative for redness.  Respiratory: Negative for shortness of breath.   Gastrointestinal: Negative for abdominal pain.  Genitourinary: Positive for vaginal bleeding. Negative for dysuria.  Musculoskeletal: Negative for back pain.  Skin: Negative for rash.  Neurological: Negative for headaches.  Hematological: Does not bruise/bleed easily.  Psychiatric/Behavioral: Negative for confusion.     Physical Exam Updated Vital Signs Ht 1.6 m (5\' 3" )   Wt 92.1 kg   LMP 04/26/2018   BMI 35.96 kg/m   Physical  Exam  Constitutional: She is oriented to person, place, and time. She appears well-developed and well-nourished. No distress.  HENT:  Head: Normocephalic and atraumatic.  Mouth/Throat: Oropharynx is clear and moist.  Eyes: Pupils are equal, round, and reactive to light. EOM are normal.  Neck: Normal range of motion. Neck supple.  Cardiovascular: Normal rate, regular rhythm and normal heart sounds.  Pulmonary/Chest: Effort normal and breath sounds normal.  Abdominal: Soft. Bowel sounds are normal. There is no tenderness.  Musculoskeletal: Normal range of motion.  Neurological: She is alert and oriented to person, place, and time. No cranial nerve deficit or sensory deficit. She exhibits normal muscle tone. Coordination normal.  Skin: Skin is warm. No rash noted.  Nursing note and vitals reviewed.    ED Treatments / Results  Labs (all labs ordered are listed, but only abnormal results are displayed) Labs Reviewed  URINALYSIS, ROUTINE W REFLEX MICROSCOPIC - Abnormal; Notable for the following components:      Result Value   APPearance HAZY (*)    Hgb urine dipstick MODERATE (*)    Leukocytes, UA SMALL (*)    All other components within normal limits  HCG, QUANTITATIVE, PREGNANCY    EKG None  Radiology No results found.  Procedures Procedures (including critical care time)  Medications Ordered in ED Medications - No data to display   Initial Impression / Assessment and Plan / ED Course  I have reviewed the triage vital signs and the nursing notes.  Pertinent labs & imaging results that were available during my care of the patient were reviewed by me and considered in my medical decision making (see chart for details).    Patient with full work-up needed on 18 October at [facility] had ultrasound showing viable twin pregnancy.  In the uterus.  Her blood type is documented in our records to be a positive.  So program with no concern.  Patient nontoxic no acute  distress.  Quantitative hCG ordered here today for follow-up purposes.  Patient will follow back up with [facility].  Patient understands that due to the first trimester nature of the pregnancy there is nothing that can be done if she is miscarrying or miscarrying 1 of the twins.   Final Clinical Impressions(s) / ED Diagnoses   Final diagnoses:  Threatened miscarriage    ED Discharge Orders    None       Vanetta Mulders, MD 07/05/18 [phone]

## 2018-07-12 DIAGNOSIS — L309 Dermatitis, unspecified: Secondary | ICD-10-CM | POA: Diagnosis not present

## 2018-07-12 DIAGNOSIS — L709 Acne, unspecified: Secondary | ICD-10-CM | POA: Diagnosis not present

## 2018-07-26 ENCOUNTER — Ambulatory Visit (INDEPENDENT_AMBULATORY_CARE_PROVIDER_SITE_OTHER): Payer: Medicaid Other

## 2018-07-26 ENCOUNTER — Ambulatory Visit (INDEPENDENT_AMBULATORY_CARE_PROVIDER_SITE_OTHER): Payer: Medicaid Other | Admitting: Women's Health

## 2018-07-26 ENCOUNTER — Encounter: Payer: Self-pay | Admitting: Women's Health

## 2018-07-26 VITALS — BP 113/77 | HR 103 | Wt 206.0 lb

## 2018-07-26 DIAGNOSIS — Z87440 Personal history of urinary (tract) infections: Secondary | ICD-10-CM

## 2018-07-26 DIAGNOSIS — Z1389 Encounter for screening for other disorder: Secondary | ICD-10-CM

## 2018-07-26 DIAGNOSIS — Z3682 Encounter for antenatal screening for nuchal translucency: Secondary | ICD-10-CM | POA: Diagnosis not present

## 2018-07-26 DIAGNOSIS — O0990 Supervision of high risk pregnancy, unspecified, unspecified trimester: Secondary | ICD-10-CM

## 2018-07-26 DIAGNOSIS — Z3A13 13 weeks gestation of pregnancy: Secondary | ICD-10-CM

## 2018-07-26 DIAGNOSIS — Z331 Pregnant state, incidental: Secondary | ICD-10-CM

## 2018-07-26 DIAGNOSIS — O0991 Supervision of high risk pregnancy, unspecified, first trimester: Secondary | ICD-10-CM

## 2018-07-26 DIAGNOSIS — O30041 Twin pregnancy, dichorionic/diamniotic, first trimester: Secondary | ICD-10-CM

## 2018-07-26 LAB — POCT URINALYSIS DIPSTICK OB
GLUCOSE, UA: NEGATIVE
Nitrite, UA: NEGATIVE
POC,PROTEIN,UA: NEGATIVE
RBC UA: NEGATIVE

## 2018-07-26 NOTE — Progress Notes (Signed)
US 13 wks,DI/DI twins,normal ovaries bilat BABY A: anterior placenta gr 0,NB present,fht 157 bpm,crl 83.94 mm,NT 1.8 mm BABY B: anterior placenta gr 0,NB present,fhr 150 bpm,crl 81.88 mm,NT 1.5 mm

## 2018-07-26 NOTE — Patient Instructions (Addendum)
Amanda Perez, I greatly value your feedback.  If you receive a survey following your visit with [place] today, we appreciate you taking the time to fill it out.  Thanks, Joellyn Haff, CNM, WHNP-BC  Begin taking a 81mg  baby aspirin daily to decrease risk of preeclampsia during pregnancy   Constipation  Drink plenty of fluid, preferably water, throughout the day  Eat foods high in fiber such as fruits, vegetables, and grains  Exercise, such as walking, is a good way to keep your bowels regular  Drink warm fluids, especially warm prune juice, or decaf coffee  Eat a 1/2 cup of real oatmeal (not instant), 1/2 cup applesauce, and 1/2-1 cup warm prune juice every day  If needed, you may take Colace (docusate sodium) stool softener once or twice a day to help keep the stool soft. If you are pregnant, wait until you are out of your first trimester (12-14 weeks of pregnancy)  If you still are having problems with constipation, you may take Miralax once daily as needed to help keep your bowels regular.  If you are pregnant, wait until you are out of your first trimester (12-14 weeks of pregnancy)      Second Trimester of Pregnancy The second trimester is from week 14 through week 27 (months 4 through 6). The second trimester is often a time when you feel your best. Your body has adjusted to being pregnant, and you begin to feel better physically. Usually, morning sickness has lessened or quit completely, you may have more energy, and you may have an increase in appetite. The second trimester is also a time when the fetus is growing rapidly. At the end of the sixth month, the fetus is about 9 inches long and weighs about 1 pounds. You will likely begin to feel the baby move (quickening) between 16 and 20 weeks of pregnancy. Body changes during your second trimester Your body continues to go through many changes during your second trimester. The changes vary from woman to woman.  Your weight will  continue to increase. You will notice your lower abdomen bulging out.  You may begin to get stretch marks on your hips, abdomen, and breasts.  You may develop headaches that can be relieved by medicines. The medicines should be approved by your health care provider.  You may urinate more often because the fetus is pressing on your bladder.  You may develop or continue to have heartburn as a result of your pregnancy.  You may develop constipation because certain hormones are causing the muscles that push waste through your intestines to slow down.  You may develop hemorrhoids or swollen, bulging veins (varicose veins).  You may have back pain. This is caused by: ? Weight gain. ? Pregnancy hormones that are relaxing the joints in your pelvis. ? A shift in weight and the muscles that support your balance.  Your breasts will continue to grow and they will continue to become tender.  Your gums may bleed and may be sensitive to brushing and flossing.  Dark spots or blotches (chloasma, mask of pregnancy) may develop on your face. This will likely fade after the baby is born.  A dark line from your belly button to the pubic area (linea nigra) may appear. This will likely fade after the baby is born.  You may have changes in your hair. These can include thickening of your hair, rapid growth, and changes in texture. Some women also have hair loss during or after pregnancy, or  hair that feels dry or thin. Your hair will most likely return to normal after your baby is born.  What to expect at prenatal visits During a routine prenatal visit:  You will be weighed to make sure you and the fetus are growing normally.  Your blood pressure will be taken.  Your abdomen will be measured to track your baby's growth.  The fetal heartbeat will be listened to.  Any test results from the previous visit will be discussed.  Your health care provider may ask you:  How you are feeling.  If you are  feeling the baby move.  If you have had any abnormal symptoms, such as leaking fluid, bleeding, severe headaches, or abdominal cramping.  If you are using any tobacco products, including cigarettes, chewing tobacco, and electronic cigarettes.  If you have any questions.  Other tests that may be performed during your second trimester include:  Blood tests that check for: ? Low iron levels (anemia). ? High blood sugar that affects pregnant women (gestational diabetes) between 9024 and 28 weeks. ? Rh antibodies. This is to check for a protein on red blood cells (Rh factor).  Urine tests to check for infections, diabetes, or protein in the urine.  An ultrasound to confirm the proper growth and development of the baby.  An amniocentesis to check for possible genetic problems.  Fetal screens for spina bifida and Down syndrome.  HIV (human immunodeficiency virus) testing. Routine prenatal testing includes screening for HIV, unless you choose not to have this test.  Follow these instructions at home: Medicines  Follow your health care provider's instructions regarding medicine use. Specific medicines may be either safe or unsafe to take during pregnancy.  Take a prenatal vitamin that contains at least 600 micrograms (mcg) of folic acid.  If you develop constipation, try taking a stool softener if your health care provider approves. Eating and drinking  Eat a balanced diet that includes fresh fruits and vegetables, whole grains, good sources of protein such as meat, eggs, or tofu, and low-fat dairy. Your health care provider will help you determine the amount of weight gain that is right for you.  Avoid raw meat and uncooked cheese. These carry germs that can cause birth defects in the baby.  If you have low calcium intake from food, talk to your health care provider about whether you should take a daily calcium supplement.  Limit foods that are high in fat and processed sugars, such as  fried and sweet foods.  To prevent constipation: ? Drink enough fluid to keep your urine clear or pale yellow. ? Eat foods that are high in fiber, such as fresh fruits and vegetables, whole grains, and beans. Activity  Exercise only as directed by your health care provider. Most women can continue their usual exercise routine during pregnancy. Try to exercise for 30 minutes at least 5 days a week. Stop exercising if you experience uterine contractions.  Avoid heavy lifting, wear low heel shoes, and practice good posture.  A sexual relationship may be continued unless your health care provider directs you otherwise. Relieving pain and discomfort  Wear a good support bra to prevent discomfort from breast tenderness.  Take warm sitz baths to soothe any pain or discomfort caused by hemorrhoids. Use hemorrhoid cream if your health care provider approves.  Rest with your legs elevated if you have leg cramps or low back pain.  If you develop varicose veins, wear support hose. Elevate your feet for 15 minutes,  3-4 times a day. Limit salt in your diet. Prenatal Care  Write down your questions. Take them to your prenatal visits.  Keep all your prenatal visits as told by your health care provider. This is important. Safety  Wear your seat belt at all times when driving.  Make a list of emergency phone numbers, including numbers for family, friends, the hospital, and police and fire departments. General instructions  Ask your health care provider for a referral to a local prenatal education class. Begin classes no later than the beginning of month 6 of your pregnancy.  Ask for help if you have counseling or nutritional needs during pregnancy. Your health care provider can offer advice or refer you to specialists for help with various needs.  Do not use hot tubs, steam rooms, or saunas.  Do not douche or use tampons or scented sanitary pads.  Do not cross your legs for long periods of  time.  Avoid cat litter boxes and soil used by cats. These carry germs that can cause birth defects in the baby and possibly loss of the fetus by miscarriage or stillbirth.  Avoid all smoking, herbs, alcohol, and unprescribed drugs. Chemicals in these products can affect the formation and growth of the baby.  Do not use any products that contain nicotine or tobacco, such as cigarettes and e-cigarettes. If you need help quitting, ask your health care provider.  Visit your dentist if you have not gone yet during your pregnancy. Use a soft toothbrush to brush your teeth and be gentle when you floss. Contact a health care provider if:  You have dizziness.  You have mild pelvic cramps, pelvic pressure, or nagging pain in the abdominal area.  You have persistent nausea, vomiting, or diarrhea.  You have a bad smelling vaginal discharge.  You have pain when you urinate. Get help right away if:  You have a fever.  You are leaking fluid from your vagina.  You have spotting or bleeding from your vagina.  You have severe abdominal cramping or pain.  You have rapid weight gain or weight loss.  You have shortness of breath with chest pain.  You notice sudden or extreme swelling of your face, hands, ankles, feet, or legs.  You have not felt your baby move in over an hour.  You have severe headaches that do not go away when you take medicine.  You have vision changes. Summary  The second trimester is from week 14 through week 27 (months 4 through 6). It is also a time when the fetus is growing rapidly.  Your body goes through many changes during pregnancy. The changes vary from woman to woman.  Avoid all smoking, herbs, alcohol, and unprescribed drugs. These chemicals affect the formation and growth your baby.  Do not use any tobacco products, such as cigarettes, chewing tobacco, and e-cigarettes. If you need help quitting, ask your health care provider.  Contact your health care  provider if you have any questions. Keep all prenatal visits as told by your health care provider. This is important. This information is not intended to replace advice given to you by your health care provider. Make sure you discuss any questions you have with your health care provider. Document Released: 08/22/2001 Document Revised: 02/03/2016 Document Reviewed: 10/29/2012 Elsevier Interactive Patient Education  2017 [employer].

## 2018-07-26 NOTE — Progress Notes (Signed)
HIGH-RISK PREGNANCY VISIT Patient name: Amanda Perez [place] MRN [id number]  Date of birth: 11/10/1990 Chief Complaint:   High Risk Gestation (1st IT; feels constipated)  History of Present Illness:   Amanda Perez Sobotta is a 27 y.o. 233P2002 female at 7330w0d with an Estimated Date of Delivery: 01/31/19 being seen today for ongoing management of a high-risk pregnancy complicated by di-di twins.  Today she reports constipation, hasn't tried anything. Hasn't started baby ASA yet. Contractions: Not present. Vag. Bleeding: None.  Movement: Absent. denies leaking of fluid.  Review of Systems:   Pertinent items are noted in HPI Denies abnormal vaginal discharge w/ itching/odor/irritation, headaches, visual changes, shortness of breath, chest pain, abdominal pain, severe nausea/vomiting, or problems with urination or bowel movements unless otherwise stated above. Pertinent History Reviewed:  Reviewed past medical,surgical, social, obstetrical and family history.  Reviewed problem list, medications and allergies. Physical Assessment:   Vitals:   07/26/18 1008  BP: 113/77  Pulse: (!) 103  Weight: 206 lb (93.4 kg)  Body mass index is 36.49 kg/m.           Physical Examination:   General appearance: alert, well appearing, and in no distress  Mental status: alert, oriented to person, place, and time  Skin: warm & dry   Extremities: Edema: None    Cardiovascular: normal heart rate noted  Respiratory: normal respiratory effort, no distress  Abdomen: gravid, soft, non-tender  Pelvic: Cervical exam deferred         Fetal Status: Fetal Heart Rate (bpm): +x2 u/s   Movement: Absent    Fetal Surveillance Testing today:  US 13 wks,DI/DI twins,normal ovaries bilat BABY A: anterior placenta gr 0,NB present,fht 157 bpm,crl 83.94 mm,NT 1.8 mm BABY B: anterior placenta gr 0,NB present,fhr 150 bpm,crl 81.88 mm,NT 1.5 mm  Results for orders placed or performed in visit on 07/26/18 (from the past 24 hour(s))  POC  Urinalysis Dipstick OB   Collection Time: 07/26/18 10:09 AM  Result Value Ref Range   Color, UA     Clarity, UA     Glucose, UA Negative Negative   Bilirubin, UA     Ketones, UA 1+    Spec Grav, UA     Blood, UA neg    pH, UA     POC,PROTEIN,UA Negative Negative, Trace, Small (1+), Moderate (2+), Large (3+), 4+   Urobilinogen, UA     Nitrite, UA neg    Leukocytes, UA Moderate (2+) (A) Negative   Appearance     Odor      Assessment & Plan:  1) High-risk pregnancy G3P2002 at 6930w0d with an Estimated Date of Delivery: 01/31/19   2) Di-Di twins, stable, start baby ASA today  3) UTI earlier 1st trimester> POC urine cx today  4) H/O PPH  Meds: No orders of the defined types were placed in this encounter.   Labs/procedures today: nt/it  Treatment Plan:  U/S @ 20, 24, 28, 32, 36wks    2x/wk testing @ 35wks or weekly BPP    Deliver @ 38wks   Reviewed: Preterm labor symptoms and general obstetric precautions including but not limited to vaginal bleeding, contractions, leaking of fluid and fetal movement were reviewed in detail with the patient.  All questions were answered.  Follow-up: Return in about 3 weeks (around 08/16/2018) for HROB, 2nd IT.  Orders Placed This Encounter  Procedures  . Urine Culture  . Integrated 1  . POC Urinalysis Dipstick OB   Cheral MarkerKimberly R Lorean Ekstrand CNM, [facility]NP-BC 07/26/2018  10:29 AM

## 2018-07-28 LAB — URINE CULTURE

## 2018-07-29 ENCOUNTER — Other Ambulatory Visit: Payer: Self-pay | Admitting: Women's Health

## 2018-07-29 ENCOUNTER — Encounter: Payer: Self-pay | Admitting: Women's Health

## 2018-07-29 DIAGNOSIS — R8271 Bacteriuria: Secondary | ICD-10-CM | POA: Insufficient documentation

## 2018-07-29 MED ORDER — AMOXICILLIN 500 MG PO CAPS: 500.0000 mg | ORAL_CAPSULE | Freq: Two times a day (BID) | ORAL | 0 refills | Status: DC

## 2018-07-30 LAB — INTEGRATED 1
CROWN RUMP LENGTH TWIN B: 81.4 mm
Crown Rump Length: 83.9 mm
GEST. AGE ON COLLECTION DATE: 13.9 wk
MATERNAL AGE AT EDD: 26.9 a
NT Twin B: 1.5 mm
NUCHAL TRANSLUCENCY (NT): 1.8 mm
NUMBER OF FETUSES: 2
PAPP-A VALUE: 7803.9 ng/mL
Weight: 206 [lb_av]

## 2018-08-03 ENCOUNTER — Encounter (HOSPITAL_COMMUNITY): Payer: Self-pay

## 2018-08-03 ENCOUNTER — Emergency Department (HOSPITAL_COMMUNITY)
Admission: EM | Admit: 2018-08-03 | Discharge: 2018-08-03 | Disposition: A | Discharge status: Home / Self Care | Payer: Medicaid Other | Attending: Emergency Medicine | Admitting: Emergency Medicine

## 2018-08-03 ENCOUNTER — Other Ambulatory Visit: Payer: Self-pay

## 2018-08-03 DIAGNOSIS — J069 Acute upper respiratory infection, unspecified: Secondary | ICD-10-CM | POA: Diagnosis not present

## 2018-08-03 DIAGNOSIS — Z79899 Other long term (current) drug therapy: Secondary | ICD-10-CM | POA: Diagnosis not present

## 2018-08-03 DIAGNOSIS — Z3A14 14 weeks gestation of pregnancy: Secondary | ICD-10-CM | POA: Diagnosis not present

## 2018-08-03 DIAGNOSIS — O9989 Other specified diseases and conditions complicating pregnancy, childbirth and the puerperium: Secondary | ICD-10-CM | POA: Insufficient documentation

## 2018-08-03 DIAGNOSIS — R0981 Nasal congestion: Secondary | ICD-10-CM | POA: Insufficient documentation

## 2018-08-03 NOTE — Discharge Instructions (Addendum)
You were evaluated in the emergency department for nasal congestion and cough.  This likely is a viral upper respiratory infection.  You can use saline nasal spray to help with the congestion.  Please keep well-hydrated.  You are already on antibiotics for the GBS and you should continue that.  Follow-up with your doctor or return if any worsening symptoms.

## 2018-08-03 NOTE — ED Provider Notes (Signed)
[facility] EMERGENCY DEPARTMENT Provider Note   CSN: [id number] Arrival date & time: 08/03/18  7829     History   Chief Complaint Chief Complaint  Patient presents with  . URI    HPI Amanda Perez is a 27 y.o. female.  She is [redacted] weeks pregnant.  She presents today with feeling like she has an upper respiratory infection since Tuesday.  She has had nasal congestion sinus pressure and since last evening a productive cough.  Denies fevers or chills.  No headache or body aches.  No abdominal pain or urinary symptoms.  She is tried nothing for it.  She said she try to reach her doctor but they did not have any appointments.  Her significant other was here yesterday and was diagnosed with bronchitis.  The history is provided by the patient.  URI   This is a new problem. Episode onset: 5 days. The problem has been gradually worsening. There has been no fever. Associated symptoms include congestion, rhinorrhea, sinus pain, sneezing, sore throat and cough. Pertinent negatives include no chest pain, no abdominal pain, no diarrhea, no nausea, no vomiting, no dysuria, no headaches, no rash and no wheezing. She has tried nothing for the symptoms. The treatment provided no relief.    Past Medical History:  Diagnosis Date  . HSV-2 (herpes simplex virus 2) infection   . Other disorder of menstruation and other abnormal bleeding from female genital tract 07/10/2013   Bleeding daily since IUD inserted  . Pregnant 12/29/2014  . Pregnant     Patient Active Problem List   Diagnosis Date Noted  . GBS bacteriuria 07/29/2018  . Supervision of high risk pregnancy, antepartum 06/28/2018  . Twin gestation, dichorionic diamniotic 06/28/2018  . UTI (urinary tract infection) during pregnancy, first trimester 06/28/2018  . History of postpartum hemorrhage, currently pregnant 06/28/2018  . Monilial vulvovaginitis 03/18/2015  . HSV-2 (herpes simplex virus 2) infection     Past Surgical History:    Procedure Laterality Date  . DILATION AND EVACUATION N/A 08/15/2015   Procedure: DILATATION and curretage, repair of laceration;  Surgeon: Tereso Newcomer, MD;  Location: [facility];  Service: Gynecology;  Laterality: N/A;     OB History    Gravida  3   Para  2   Term  2   Preterm      AB      Living  2     SAB      TAB      Ectopic      Multiple  0   Live Births  2            Home Medications    Prior to Admission medications   Medication Sig Start Date End Date Taking? Authorizing Provider  amoxicillin (AMOXIL) 500 MG capsule Take 1 capsule (500 mg total) by mouth 2 (two) times daily. X 7 days 07/29/18   Cheral Marker, CNM  Doxylamine-Pyridoxine (DICLEGIS) 10-10 MG TBEC 2 tabs q hs, if sx persist add 1 tab q am on day 3, if sx persist add 1 tab q afternoon on day 4 06/28/18   Cheral Marker, CNM  Prenat w/o [id number] [facility] ASSURE) 35-1 & 300 MG tablet One tablet and one capsule daily 06/28/18   Cheral Marker, CNM    Family History Family History  Problem Relation Age of Onset  . Hypertension Mother   . Seizures Mother   . Hypertension Paternal Grandmother   . Hypertension Maternal Grandmother  Social History Social History   Tobacco Use  . Smoking status: Never Smoker  . Smokeless tobacco: Never Used  Substance Use Topics  . Alcohol use: Not Currently    Frequency: Never  . Drug use: No     Allergies   Patient has no known allergies.   Review of Systems Review of Systems  Constitutional: Negative for fever.  HENT: Positive for congestion, rhinorrhea, sinus pain, sneezing and sore throat.   Eyes: Negative for visual disturbance.  Respiratory: Positive for cough. Negative for shortness of breath and wheezing.   Cardiovascular: Negative for chest pain.  Gastrointestinal: Negative for abdominal pain, diarrhea, nausea and vomiting.  Genitourinary: Negative for dysuria.  Musculoskeletal: Negative for back pain.   Skin: Negative for rash.  Neurological: Negative for headaches.     Physical Exam Updated Vital Signs Ht 5\' 3"  (1.6 m)   Wt 93.4 kg   LMP 04/26/2018   BMI 36.49 kg/m   Physical Exam  Constitutional: She appears well-developed and well-nourished. No distress.  HENT:  Head: Normocephalic and atraumatic.  Right Ear: External ear and ear canal normal. A middle ear effusion is present.  Left Ear: Tympanic membrane, external ear and ear canal normal.  Nose: Rhinorrhea present.  Mouth/Throat: Uvula is midline, oropharynx is clear and moist and mucous membranes are normal.  Eyes: Conjunctivae are normal.  Neck: Neck supple.  Cardiovascular: Normal rate, regular rhythm and normal heart sounds.  No murmur heard. Pulmonary/Chest: Effort normal and breath sounds normal. No respiratory distress.  Abdominal: Soft. There is no tenderness.  Musculoskeletal: She exhibits no edema.  Neurological: She is alert.  Skin: Skin is warm and dry.  Psychiatric: She has a normal mood and affect.  Nursing note and vitals reviewed.    ED Treatments / Results  Labs (all labs ordered are listed, but only abnormal results are displayed) Labs Reviewed - No data to display  EKG None  Radiology No results found.  Procedures Procedures (including critical care time)  Medications Ordered in ED Medications - No data to display   Initial Impression / Assessment and Plan / ED Course  I have reviewed the triage vital signs and the nursing notes.  Pertinent labs & imaging results that were available during my care of the patient were reviewed by me and considered in my medical decision making (see chart for details).  Clinical Course as of Aug 03 1502  Sat Aug 03, 2018  [id number] 27 year old female here with upper respiratory infection.  She does have some signs of middle ear effusion.  Having her do some over-the-counter saline nasal spray and will give her a prescription for amoxicillin to take if  her symptoms worsen in the next few days.   [MB]  [phone] Reviewed with patient-she is actually already on amoxicillin treated by her OB for GBS.  I told her continue that and use the saline spray.   [MB]    Clinical Course User Index [MB] Terrilee FilesButler, Michael C, MD     Final Clinical Impressions(s) / ED Diagnoses   Final diagnoses:  Upper respiratory tract infection, unspecified type    ED Discharge Orders    None       Terrilee FilesButler, Michael C, MD 08/03/18 1504

## 2018-08-03 NOTE — ED Triage Notes (Signed)
Pt reports cough and congestion since Tuesday.  Unknown if has had fever at home.

## 2018-08-16 ENCOUNTER — Encounter: Payer: Self-pay | Admitting: Obstetrics and Gynecology

## 2018-08-16 ENCOUNTER — Ambulatory Visit (INDEPENDENT_AMBULATORY_CARE_PROVIDER_SITE_OTHER): Payer: Medicaid Other | Admitting: Obstetrics and Gynecology

## 2018-08-16 VITALS — BP 113/68 | HR 95 | Wt 207.4 lb

## 2018-08-16 DIAGNOSIS — Z1379 Encounter for other screening for genetic and chromosomal anomalies: Secondary | ICD-10-CM

## 2018-08-16 DIAGNOSIS — Z3A16 16 weeks gestation of pregnancy: Secondary | ICD-10-CM

## 2018-08-16 DIAGNOSIS — M5431 Sciatica, right side: Secondary | ICD-10-CM

## 2018-08-16 DIAGNOSIS — M5430 Sciatica, unspecified side: Secondary | ICD-10-CM | POA: Insufficient documentation

## 2018-08-16 DIAGNOSIS — Z1389 Encounter for screening for other disorder: Secondary | ICD-10-CM

## 2018-08-16 DIAGNOSIS — Z331 Pregnant state, incidental: Secondary | ICD-10-CM

## 2018-08-16 DIAGNOSIS — O0990 Supervision of high risk pregnancy, unspecified, unspecified trimester: Secondary | ICD-10-CM

## 2018-08-16 DIAGNOSIS — O30042 Twin pregnancy, dichorionic/diamniotic, second trimester: Secondary | ICD-10-CM

## 2018-08-16 LAB — POCT URINALYSIS DIPSTICK OB
Glucose, UA: NEGATIVE
Ketones, UA: NEGATIVE
Nitrite, UA: NEGATIVE
POC,PROTEIN,UA: NEGATIVE

## 2018-08-16 NOTE — Progress Notes (Addendum)
Patient ID: Amanda SettlerBriana Perez, female   DOB: 06/04/1991, 27 y.o.   MRN: [id number]    [facility] Patient name: Amanda Perez MRN [id number]  Date of birth: 02/18/1991 Chief Complaint:   High Risk Gestation (2nd IT; swelling in feet; pain in right side)  History of Present Illness:   Amanda Perez is a 27 y.o. 603P2002 female at 6917w0d with an Estimated Date of Delivery: 01/31/19 being seen today for ongoing management of a high-risk pregnancy complicated by di-di twins. Amanda ColeBriana is a twin and has a fraternal sister. Has considerations for tubal after delivery Today she reports Been having pain on right side, from her hip and down to back of leg, it sometimes cramps to the point where she can't get out of bed. 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 Had previous symptom with son but is much worse with this pregnancy Contractions: Not present. Vag. Bleeding: None.   . denies leaking of fluid.  Review of Systems:   Pertinent items are noted in HPI Denies abnormal vaginal discharge w/ itching/odor/irritation, headaches, visual changes, shortness of breath, chest pain, abdominal pain, severe nausea/vomiting, or problems with urination or bowel movements unless otherwise stated above. Pertinent History Reviewed:  Reviewed past medical,surgical, social, obstetrical and family history.  Reviewed problem list, medications and allergies. Physical Assessment:   Vitals:   08/16/18 0913  BP: 113/68  Pulse: 95  Weight: 207 lb 6.4 oz (94.1 kg)  Body mass index is 36.74 kg/m.           Physical Examination:   General appearance: alert, well appearing, and in no distress and oriented to person, place, and time  Mental status: alert, oriented to person, place, and time, normal mood, behavior, speech, dress, motor activity, and thought processes, affect appropriate to mood  Skin: warm & dry   Extremities: Edema: Trace    Cardiovascular: normal heart rate noted  Respiratory: normal respiratory effort, no distress  Abdomen: gravid, soft, non-tender  Pelvic: Cervical exam deferred         Fetal Status: Fetal Heart Rate (bpm): + x2 154/160        Fetal Surveillance Testing today: u/s machine used: first planceta anterior, head first second tansverse  Results for orders placed or performed in visit on 08/16/18 (from the past 24 hour(s))  POC Urinalysis Dipstick OB   Collection Time: 08/16/18  9:14 AM  Result Value Ref Range   Color, UA     Clarity, UA     Glucose, UA Negative Negative   Bilirubin, UA     Ketones, UA neg    Spec Grav, UA     Blood, UA trace    pH, UA     POC,PROTEIN,UA Negative Negative, Trace, Small (1+), Moderate (2+), Large (3+), 4+   Urobilinogen, UA     Nitrite, UA neg    Leukocytes, UA Trace (A) Negative   Appearance     Odor      Assessment & Plan:  1) High-risk pregnancy G3P2002 at 4717w0d with an Estimated Date of Delivery: 01/31/19   2) di-di twins, stable  3) Sciatica, right leg, stable, water exercises recommended will monitor q. visit to see if she can continue to work.  May require referral to physical therapy if work limitations are being considered  Meds: No orders of the defined types were placed in this encounter.   Labs/procedures today: 2nd ITs  Treatment Plan:  1. Stretching exercises to alleviate pain, comfortable sues, physical therapy information at [facility]  If patient chooses.  2. F/u in 4 weeks with u/s:anatomy  Follow-up: No follow-ups on file.  Orders Placed This Encounter  Procedures  . INTEGRATED 2  . POC Urinalysis Dipstick OB   By signing my name below, I, Arnette Norris, attest that this documentation has been prepared under the direction and in the presence of Tilda Burrow, MD. Electronically Signed: Arnette Norris Medical Scribe. 08/16/18. 9:50 AM.  I personally performed the services described in this documentation, which was SCRIBED in  my presence. The recorded information has been reviewed and considered accurate. It has been edited as necessary during review. Tilda Burrow, MD

## 2018-08-19 LAB — INTEGRATED 2
AFP MARKER: 84.7 ng/mL
AFP MOM: 2.81
Crown Rump Length Twin B: 81.4 mm
Crown Rump Length: 83.9 mm
DIA MoM: 2.38
DIA Value: 321.9 pg/mL
ESTRIOL UNCONJUGATED: 1.72 ng/mL
GESTATIONAL AGE: 16.9 wk
Gest. Age on Collection Date: 13.9 weeks
Maternal Age at EDD: 26.9 yr
NT MoM Twin B: 0.78
NT Twin B: 1.5 mm
NUCHAL TRANSLUCENCY MOM: 0.89
Nuchal Translucency (NT): 1.8 mm
Number of Fetuses: 2
PAPP-A MoM: 7.43
PAPP-A Value: 7803.9 ng/mL
WEIGHT: 206 [lb_av]
Weight: 207 [lb_av]
hCG MoM: 4.6
hCG Value: 111.4 IU/mL
uE3 MoM: 1.81

## 2018-09-11 NOTE — L&D Delivery Note (Signed)
Delivery Note    Not long after pt got comfortable w/her epidural, she c/o pressure and was noted to be C/C/+2/      After one push, at 10:31 PM a viable female was delivered via Vaginal, Spontaneous (Presentation: LOA, loose nuchal delivered through ).  APGAR: 9, 9; weight pending. Cord clamped after one minute, cut by dad,and baby given to mom.   TXA 1000mg  was started IV.  Amanda Perez, Amanda Perez [[id number]] Baby B remained vtx and settled down nicely into the pelvis.  Pt pushed with a few ctx, bringing Baby B to 0 station. AROM w/clear fluid. After a few more pushes,      at 10:48 PM a viable female was delivered via Vaginal, Spontaneous (Presentation: OA  ).  APGAR: 9, 9; weight pending. After one minute, the cord was clamped and cut.  40 units of Pitocin diluted in 1000 cc LR given rapidly IV. THe placentas separated spontaneously and delivered via CCT at 2256, inspected and both appear to be intact w/3vc.   D/T hx of severe PPH w/last baby, cytotec 800 mcg was given PR and methergine 0.2mg  given IM. Foley catheter reinserted. The fundus remained firm, but pt continued to have some bright red oozing.  Hemabate given IM.  Oozing resolved after a few minutes.   Anesthesia:  epidural Episiotomy: None Lacerations: None Suture Repair:  Est. Blood Loss (mL): 321   Mom to postpartum.   Baby A to Couplet care / Skin to Skin.   Baby B to Couplet care / Skin to Skin   Dr. Macon Perez was present for the entirety of the above. Amanda Perez 01/09/2019, 11:33 PM

## 2018-09-12 ENCOUNTER — Other Ambulatory Visit: Payer: Self-pay | Admitting: Obstetrics and Gynecology

## 2018-09-12 DIAGNOSIS — O30042 Twin pregnancy, dichorionic/diamniotic, second trimester: Secondary | ICD-10-CM

## 2018-09-12 DIAGNOSIS — Z363 Encounter for antenatal screening for malformations: Secondary | ICD-10-CM

## 2018-09-13 ENCOUNTER — Ambulatory Visit (INDEPENDENT_AMBULATORY_CARE_PROVIDER_SITE_OTHER): Payer: Medicaid Other

## 2018-09-13 ENCOUNTER — Encounter: Payer: Self-pay | Admitting: Women's Health

## 2018-09-13 ENCOUNTER — Ambulatory Visit (INDEPENDENT_AMBULATORY_CARE_PROVIDER_SITE_OTHER): Payer: Medicaid Other | Admitting: Women's Health

## 2018-09-13 VITALS — BP 121/80 | HR 99 | Wt 212.0 lb

## 2018-09-13 DIAGNOSIS — Z3A20 20 weeks gestation of pregnancy: Secondary | ICD-10-CM

## 2018-09-13 DIAGNOSIS — O30042 Twin pregnancy, dichorionic/diamniotic, second trimester: Secondary | ICD-10-CM

## 2018-09-13 DIAGNOSIS — Z363 Encounter for antenatal screening for malformations: Secondary | ICD-10-CM | POA: Diagnosis not present

## 2018-09-13 DIAGNOSIS — O2342 Unspecified infection of urinary tract in pregnancy, second trimester: Secondary | ICD-10-CM

## 2018-09-13 DIAGNOSIS — Z1389 Encounter for screening for other disorder: Secondary | ICD-10-CM

## 2018-09-13 DIAGNOSIS — O0990 Supervision of high risk pregnancy, unspecified, unspecified trimester: Secondary | ICD-10-CM

## 2018-09-13 DIAGNOSIS — Z331 Pregnant state, incidental: Secondary | ICD-10-CM

## 2018-09-13 DIAGNOSIS — O0992 Supervision of high risk pregnancy, unspecified, second trimester: Secondary | ICD-10-CM

## 2018-09-13 LAB — POCT URINALYSIS DIPSTICK OB
GLUCOSE, UA: NEGATIVE
KETONES UA: NEGATIVE
Leukocytes, UA: NEGATIVE
Nitrite, UA: NEGATIVE
POC,PROTEIN,UA: NEGATIVE
RBC UA: NEGATIVE

## 2018-09-13 NOTE — Patient Instructions (Signed)
Amanda Perez, I greatly value your feedback.  If you receive a survey following your visit with [place] today, we appreciate you taking the time to fill it out.  Thanks, Joellyn Haff, CNM, WHNP-BC   Second Trimester of Pregnancy The second trimester is from week 14 through week 27 (months 4 through 6). The second trimester is often a time when you feel your best. Your body has adjusted to being pregnant, and you begin to feel better physically. Usually, morning sickness has lessened or quit completely, you may have more energy, and you may have an increase in appetite. The second trimester is also a time when the fetus is growing rapidly. At the end of the sixth month, the fetus is about 9 inches long and weighs about 1 pounds. You will likely begin to feel the baby move (quickening) between 16 and 20 weeks of pregnancy. Body changes during your second trimester Your body continues to go through many changes during your second trimester. The changes vary from woman to woman.  Your weight will continue to increase. You will notice your lower abdomen bulging out.  You may begin to get stretch marks on your hips, abdomen, and breasts.  You may develop headaches that can be relieved by medicines. The medicines should be approved by your health care provider.  You may urinate more often because the fetus is pressing on your bladder.  You may develop or continue to have heartburn as a result of your pregnancy.  You may develop constipation because certain hormones are causing the muscles that push waste through your intestines to slow down.  You may develop hemorrhoids or swollen, bulging veins (varicose veins).  You may have back pain. This is caused by: ? Weight gain. ? Pregnancy hormones that are relaxing the joints in your pelvis. ? A shift in weight and the muscles that support your balance.  Your breasts will continue to grow and they will continue to become tender.  Your gums may bleed and  may be sensitive to brushing and flossing.  Dark spots or blotches (chloasma, mask of pregnancy) may develop on your face. This will likely fade after the baby is born.  A dark line from your belly button to the pubic area (linea nigra) may appear. This will likely fade after the baby is born.  You may have changes in your hair. These can include thickening of your hair, rapid growth, and changes in texture. Some women also have hair loss during or after pregnancy, or hair that feels dry or thin. Your hair will most likely return to normal after your baby is born.  What to expect at prenatal visits During a routine prenatal visit:  You will be weighed to make sure you and the fetus are growing normally.  Your blood pressure will be taken.  Your abdomen will be measured to track your baby's growth.  The fetal heartbeat will be listened to.  Any test results from the previous visit will be discussed.  Your health care provider may ask you:  How you are feeling.  If you are feeling the baby move.  If you have had any abnormal symptoms, such as leaking fluid, bleeding, severe headaches, or abdominal cramping.  If you are using any tobacco products, including cigarettes, chewing tobacco, and electronic cigarettes.  If you have any questions.  Other tests that may be performed during your second trimester include:  Blood tests that check for: ? Low iron levels (anemia). ? High blood  sugar that affects pregnant women (gestational diabetes) between 55 and 28 weeks. ? Rh antibodies. This is to check for a protein on red blood cells (Rh factor).  Urine tests to check for infections, diabetes, or protein in the urine.  An ultrasound to confirm the proper growth and development of the baby.  An amniocentesis to check for possible genetic problems.  Fetal screens for spina bifida and Down syndrome.  HIV (human immunodeficiency virus) testing. Routine prenatal testing includes  screening for HIV, unless you choose not to have this test.  Follow these instructions at home: Medicines  Follow your health care provider's instructions regarding medicine use. Specific medicines may be either safe or unsafe to take during pregnancy.  Take a prenatal vitamin that contains at least 600 micrograms (mcg) of folic acid.  If you develop constipation, try taking a stool softener if your health care provider approves. Eating and drinking  Eat a balanced diet that includes fresh fruits and vegetables, whole grains, good sources of protein such as meat, eggs, or tofu, and low-fat dairy. Your health care provider will help you determine the amount of weight gain that is right for you.  Avoid raw meat and uncooked cheese. These carry germs that can cause birth defects in the baby.  If you have low calcium intake from food, talk to your health care provider about whether you should take a daily calcium supplement.  Limit foods that are high in fat and processed sugars, such as fried and sweet foods.  To prevent constipation: ? Drink enough fluid to keep your urine clear or pale yellow. ? Eat foods that are high in fiber, such as fresh fruits and vegetables, whole grains, and beans. Activity  Exercise only as directed by your health care provider. Most women can continue their usual exercise routine during pregnancy. Try to exercise for 30 minutes at least 5 days a week. Stop exercising if you experience uterine contractions.  Avoid heavy lifting, wear low heel shoes, and practice good posture.  A sexual relationship may be continued unless your health care provider directs you otherwise. Relieving pain and discomfort  Wear a good support bra to prevent discomfort from breast tenderness.  Take warm sitz baths to soothe any pain or discomfort caused by hemorrhoids. Use hemorrhoid cream if your health care provider approves.  Rest with your legs elevated if you have leg cramps  or low back pain.  If you develop varicose veins, wear support hose. Elevate your feet for 15 minutes, 3-4 times a day. Limit salt in your diet. Prenatal Care  Write down your questions. Take them to your prenatal visits.  Keep all your prenatal visits as told by your health care provider. This is important. Safety  Wear your seat belt at all times when driving.  Make a list of emergency phone numbers, including numbers for family, friends, the hospital, and police and fire departments. General instructions  Ask your health care provider for a referral to a local prenatal education class. Begin classes no later than the beginning of month 6 of your pregnancy.  Ask for help if you have counseling or nutritional needs during pregnancy. Your health care provider can offer advice or refer you to specialists for help with various needs.  Do not use hot tubs, steam rooms, or saunas.  Do not douche or use tampons or scented sanitary pads.  Do not cross your legs for long periods of time.  Avoid cat litter boxes and soil used  by cats. These carry germs that can cause birth defects in the baby and possibly loss of the fetus by miscarriage or stillbirth.  Avoid all smoking, herbs, alcohol, and unprescribed drugs. Chemicals in these products can affect the formation and growth of the baby.  Do not use any products that contain nicotine or tobacco, such as cigarettes and e-cigarettes. If you need help quitting, ask your health care provider.  Visit your dentist if you have not gone yet during your pregnancy. Use a soft toothbrush to brush your teeth and be gentle when you floss. Contact a health care provider if:  You have dizziness.  You have mild pelvic cramps, pelvic pressure, or nagging pain in the abdominal area.  You have persistent nausea, vomiting, or diarrhea.  You have a bad smelling vaginal discharge.  You have pain when you urinate. Get help right away if:  You have a  fever.  You are leaking fluid from your vagina.  You have spotting or bleeding from your vagina.  You have severe abdominal cramping or pain.  You have rapid weight gain or weight loss.  You have shortness of breath with chest pain.  You notice sudden or extreme swelling of your face, hands, ankles, feet, or legs.  You have not felt your baby move in over an hour.  You have severe headaches that do not go away when you take medicine.  You have vision changes. Summary  The second trimester is from week 14 through week 27 (months 4 through 6). It is also a time when the fetus is growing rapidly.  Your body goes through many changes during pregnancy. The changes vary from woman to woman.  Avoid all smoking, herbs, alcohol, and unprescribed drugs. These chemicals affect the formation and growth your baby.  Do not use any tobacco products, such as cigarettes, chewing tobacco, and e-cigarettes. If you need help quitting, ask your health care provider.  Contact your health care provider if you have any questions. Keep all prenatal visits as told by your health care provider. This is important. This information is not intended to replace advice given to you by your health care provider. Make sure you discuss any questions you have with your health care provider. Document Released: 08/22/2001 Document Revised: 02/03/2016 Document Reviewed: 10/29/2012 Elsevier Interactive Patient Education  2017 [employer].

## 2018-09-13 NOTE — Progress Notes (Signed)
[place] 20 wks,DI/DI twins,cx 3.9 cm,normal ovaries bilat BABY A: cephalic,anterior placenta gr 0,fhr 140 bpm,svp of fluid 4.8 cm,EFW 414 g 97%,discordance 1.6% BABY B: transverse head left superior,svp of fluid 5.4 cm,fhr 148 bpm,efw 407 g 96%,anterior placenta gr 0

## 2018-09-13 NOTE — Progress Notes (Signed)
   HIGH-RISK PREGNANCY VISIT Patient name: Amanda Perez MRN [id number]  Date of birth: [birth year] Chief Complaint:   Routine Prenatal Visit  History of Present Illness:   Amanda Perez is a 28 y.o. G32P2002 female at [redacted]w[redacted]d with an Estimated Date of Delivery: 01/31/19 being seen today for ongoing management of a high-risk pregnancy complicated by di-di twins.  Today she reports no complaints. Contractions: Not present. Vag. Bleeding: None.  Movement: Present. denies leaking of fluid.  Review of Systems:   Pertinent items are noted in HPI Denies abnormal vaginal discharge w/ itching/odor/irritation, headaches, visual changes, shortness of breath, chest pain, abdominal pain, severe nausea/vomiting, or problems with urination or bowel movements unless otherwise stated above. Pertinent History Reviewed:  Reviewed past medical,surgical, social, obstetrical and family history.  Reviewed problem list, medications and allergies. Physical Assessment:   Vitals:   09/13/18 0926  BP: 121/80  Pulse: 99  Weight: 212 lb (96.2 kg)  Body mass index is 37.55 kg/m.           Physical Examination:   General appearance: alert, well appearing, and in no distress  Mental status: alert, oriented to person, place, and time  Skin: warm & dry   Extremities: Edema: Trace    Cardiovascular: normal heart rate noted  Respiratory: normal respiratory effort, no distress  Abdomen: gravid, soft, non-tender  Pelvic: Cervical exam deferred         Fetal Status: Fetal Heart Rate (bpm): 140/148 u/s   Movement: Present    Fetal Surveillance Testing today: u/s   Results for orders placed or performed in visit on 09/13/18 (from the past 24 hour(s))  POC Urinalysis Dipstick OB   Collection Time: 09/13/18  9:30 AM  Result Value Ref Range   Color, UA     Clarity, UA     Glucose, UA Negative Negative   Bilirubin, UA     Ketones, UA neg    Spec Grav, UA     Blood, UA neg    pH, UA     POC,PROTEIN,UA Negative  Negative, Trace, Small (1+), Moderate (2+), Large (3+), 4+   Urobilinogen, UA     Nitrite, UA neg    Leukocytes, UA Negative Negative   Appearance     Odor      Assessment & Plan:  1) High-risk pregnancy G3P2002 at [redacted]w[redacted]d with an Estimated Date of Delivery: 01/31/19   2) Di-Di twins, stable, concordant growth  3) UTI in Nov> urine cx poc today  Meds: No orders of the defined types were placed in this encounter.  Labs/procedures today: u/s  Treatment Plan:  U/S @ 20, 24, 28, 32, 36wks    2x/wk testing @ 36wks or weekly BPP    Deliver @ 38wks:____   Reviewed: Preterm labor symptoms and general obstetric precautions including but not limited to vaginal bleeding, contractions, leaking of fluid and fetal movement were reviewed in detail with the patient.  All questions were answered.  Follow-up: Return in about 4 weeks (around 10/11/2018) for HROB, US:EFW, Twins.  Orders Placed This Encounter  Procedures  . Urine Culture  . US OB Follow Up  . US OB Follow Up AddL Gest  . POC Urinalysis Dipstick OB   Cheral Marker CNM, [facility] 09/13/2018 9:46 AM

## 2018-10-11 ENCOUNTER — Encounter: Payer: Self-pay | Admitting: Obstetrics & Gynecology

## 2018-10-11 ENCOUNTER — Ambulatory Visit (INDEPENDENT_AMBULATORY_CARE_PROVIDER_SITE_OTHER): Payer: Medicaid Other

## 2018-10-11 ENCOUNTER — Ambulatory Visit (INDEPENDENT_AMBULATORY_CARE_PROVIDER_SITE_OTHER): Payer: Medicaid Other | Admitting: Obstetrics & Gynecology

## 2018-10-11 ENCOUNTER — Other Ambulatory Visit: Payer: Self-pay

## 2018-10-11 VITALS — BP 114/72 | HR 97 | Wt 214.0 lb

## 2018-10-11 DIAGNOSIS — Z3A24 24 weeks gestation of pregnancy: Secondary | ICD-10-CM

## 2018-10-11 DIAGNOSIS — O30042 Twin pregnancy, dichorionic/diamniotic, second trimester: Secondary | ICD-10-CM

## 2018-10-11 DIAGNOSIS — O0990 Supervision of high risk pregnancy, unspecified, unspecified trimester: Secondary | ICD-10-CM

## 2018-10-11 DIAGNOSIS — O2342 Unspecified infection of urinary tract in pregnancy, second trimester: Secondary | ICD-10-CM | POA: Diagnosis not present

## 2018-10-11 DIAGNOSIS — Z331 Pregnant state, incidental: Secondary | ICD-10-CM

## 2018-10-11 DIAGNOSIS — Z1389 Encounter for screening for other disorder: Secondary | ICD-10-CM

## 2018-10-11 DIAGNOSIS — O0992 Supervision of high risk pregnancy, unspecified, second trimester: Secondary | ICD-10-CM

## 2018-10-11 LAB — POCT URINALYSIS DIPSTICK OB
Glucose, UA: NEGATIVE
Ketones, UA: NEGATIVE
Leukocytes, UA: NEGATIVE
Nitrite, UA: NEGATIVE
RBC UA: NEGATIVE

## 2018-10-11 NOTE — Progress Notes (Signed)
   HIGH-RISK PREGNANCY VISIT Patient name: Amanda Perez MRN [id number]  Date of birth: 1991-07-22 Chief Complaint:   High Risk Gestation (u/s today)  History of Present Illness:   Amanda Perez is a 28 y.o. G69P2002 female at [redacted]w[redacted]d with an Estimated Date of Delivery: 01/31/19 being seen today for ongoing management of a high-risk pregnancy complicated by DiDi twins.  Today she reports no complaints. Contractions: Not present. Vag. Bleeding: None.  Movement: Present. denies leaking of fluid.  Review of Systems:   Pertinent items are noted in HPI Denies abnormal vaginal discharge w/ itching/odor/irritation, headaches, visual changes, shortness of breath, chest pain, abdominal pain, severe nausea/vomiting, or problems with urination or bowel movements unless otherwise stated above. Pertinent History Reviewed:  Reviewed past medical,surgical, social, obstetrical and family history.  Reviewed problem list, medications and allergies. Physical Assessment:   Vitals:   10/11/18 0928  BP: 114/72  Pulse: 97  Weight: 214 lb (97.1 kg)  Body mass index is 37.91 kg/m.           Physical Examination:   General appearance: alert, well appearing, and in no distress  Mental status: alert, oriented to person, place, and time  Skin: warm & dry   Extremities: Edema: Trace    Cardiovascular: normal heart rate noted  Respiratory: normal respiratory effort, no distress  Abdomen: gravid, soft, non-tender  Pelvic: Cervical exam deferred         Fetal Status:     Movement: Present    Fetal Surveillance Testing today: sonogram reveals normal growth   Results for orders placed or performed in visit on 10/11/18 (from the past 24 hour(s))  POC Urinalysis Dipstick OB   Collection Time: 10/11/18  9:30 AM  Result Value Ref Range   Color, UA     Clarity, UA     Glucose, UA Negative Negative   Bilirubin, UA     Ketones, UA neg    Spec Grav, UA     Blood, UA neg    pH, UA     POC,PROTEIN,UA Trace  Negative, Trace, Small (1+), Moderate (2+), Large (3+), 4+   Urobilinogen, UA     Nitrite, UA neg    Leukocytes, UA Negative Negative   Appearance     Odor      Assessment & Plan:  1) High-risk pregnancy G3P2002 at [redacted]w[redacted]d with an Estimated Date of Delivery: 01/31/19   2) DiDi twins, stable sonogram reveals good growth for both twins    Meds: No orders of the defined types were placed in this encounter.   Labs/procedures today: sonogram   Treatment Plan:  2 hour GTT  Reviewed: Preterm labor symptoms and general obstetric precautions including but not limited to vaginal bleeding, contractions, leaking of fluid and fetal movement were reviewed in detail with the patient.  All questions were answered.  Follow-up: Return in about 4 weeks (around 11/08/2018) for PN2, sonogram twins.  Orders Placed This Encounter  Procedures  . US OB Follow Up  . POC Urinalysis Dipstick OB   Lazaro Arms  10/11/2018 9:48 AM

## 2018-10-11 NOTE — Progress Notes (Signed)
[place] 24 wks,DI/DI twins,cx 2.8 cm,normal ovaries bilat BABY A:cephalic,anterior placenta gr 0,svp of fluid 6.8 cm,fhr 164 bpm,745 g 80%,discordance 5.9% BABY B:transverse head right,anterior placenta gr 0,svp of fluid 5 cm,fhr 138 bpm,LVEICF,EFW 792 g 91%

## 2018-10-13 LAB — URINE CULTURE

## 2018-10-14 ENCOUNTER — Other Ambulatory Visit: Payer: Self-pay | Admitting: Women's Health

## 2018-10-14 MED ORDER — AMPICILLIN 500 MG PO CAPS: 500.0000 mg | ORAL_CAPSULE | Freq: Four times a day (QID) | ORAL | 0 refills | Status: DC

## 2018-10-18 ENCOUNTER — Other Ambulatory Visit: Payer: Self-pay | Admitting: Radiology

## 2018-10-21 ENCOUNTER — Emergency Department (HOSPITAL_COMMUNITY)
Admission: EM | Admit: 2018-10-21 | Discharge: 2018-10-21 | Disposition: A | Discharge status: Home / Self Care | Payer: BLUE CROSS/BLUE SHIELD | Attending: Emergency Medicine | Admitting: Emergency Medicine

## 2018-10-21 ENCOUNTER — Encounter (HOSPITAL_COMMUNITY): Payer: Self-pay

## 2018-10-21 DIAGNOSIS — Z79899 Other long term (current) drug therapy: Secondary | ICD-10-CM | POA: Insufficient documentation

## 2018-10-21 DIAGNOSIS — J111 Influenza due to unidentified influenza virus with other respiratory manifestations: Secondary | ICD-10-CM | POA: Diagnosis not present

## 2018-10-21 DIAGNOSIS — J029 Acute pharyngitis, unspecified: Secondary | ICD-10-CM | POA: Diagnosis present

## 2018-10-21 DIAGNOSIS — R6889 Other general symptoms and signs: Secondary | ICD-10-CM

## 2018-10-21 MED ORDER — IBUPROFEN 400 MG PO TABS: 400.0000 mg | ORAL_TABLET | Freq: Four times a day (QID) | ORAL | 0 refills | Status: AC | PRN

## 2018-10-21 MED ORDER — OSELTAMIVIR PHOSPHATE 75 MG PO CAPS: 75.0000 mg | ORAL_CAPSULE | Freq: Two times a day (BID) | ORAL | 0 refills | Status: AC

## 2018-10-21 MED ORDER — ACETAMINOPHEN 325 MG PO TABS: 650.0000 mg | ORAL_TABLET | Freq: Four times a day (QID) | ORAL | 0 refills | Status: AC | PRN

## 2018-10-21 MED ORDER — BENZONATATE 100 MG PO CAPS: 100.0000 mg | ORAL_CAPSULE | Freq: Three times a day (TID) | ORAL | 0 refills | Status: AC

## 2018-10-21 NOTE — ED Triage Notes (Addendum)
Patient c/o of sore throat, cough, migraines, hot flashes, and diarrhea x2 days.   6 occurrences of diarrhea in past 24 hours.   Denies n/v   A/Ox4 Ambulatory to bathroom.    Patient has been around other people who are sick.

## 2018-10-21 NOTE — Discharge Instructions (Signed)
You were given a prescription for Tamiflu.  Be aware that this medication may make you have nausea, vomiting, abdominal pain, or diarrhea.  This medication can also cause mood derangement. If you experience any side effects that you cannot tolerate, then you should stop taking the medication. Please make sure to take stay hydrated while you are taking this medication.  Tylenol and Motrin for fevers and body aches. Take benzonatate for cough.   You should follow up with your primary healthcare provider within the next 3-5 days for reevaluation.  You will need to return to the emergency department immediately if you experience any of the following symptoms:  Difficulty breathing or shortness or breath Pain or pressure in the chest or abdomen Sudden dizziness Confusion Severe or persistent vomiting Flu-like symptoms that improve but then return with fever or worse cough  You should stay home for at least 24 hours after your fever is gone except to get medical care or other necessities. Your fever should be gone without the need to use a fever-reducing medicine, such as Tylenol or Motrin. Until then, you should stay home from work, school, travel, shopping, social events, and public gatherings.  Stay away from others as much as possible to keep from infecting them. If you must leave home, for example to get medical care, wear a facemask if you have one, or cover coughs and sneezes with a tissue. Wash your hands often to keep from spreading flu to others

## 2018-10-21 NOTE — ED Provider Notes (Signed)
[facility]-EMERGENCY DEPT Provider Note   CSN: [id number] Arrival date & time: 10/21/18  0708     History   Chief Complaint Chief Complaint  Patient presents with  . flu like sx.    HPI Margaret Padilla is a 28 y.o. female.  HPI   Pt is a 28 y/o female with a h/o seizure who presents to the ED today c/o sore throat, productive cough, sweats, chills, and headaches that began 2 days ago. She has chest pain when she coughs only. No persistent chest pain and no shortness of breath. Denies body aches, rhinorrhea, congestion. She also reports diarrhea for the last 24 hours. No vomiting. No urinary sxs. No abd pain. She has tried tylenol without relief. Was recently around her niece who was diagnosed with the flu.   Past Medical History:  Diagnosis Date  . Seizure [facility])     Patient Active Problem List   Diagnosis Date Noted  . Seizure (HCC) 08/26/2013    History reviewed. No pertinent surgical history.   OB History   No obstetric history on file.      Home Medications    Prior to Admission medications   Medication Sig Start Date End Date Taking? Authorizing Provider  acetaminophen (TYLENOL) 325 MG tablet Take 2 tablets (650 mg total) by mouth every 6 (six) hours as needed for up to 5 days. Do not take more than 4000mg  of tylenol per day 10/21/18 10/26/18  Breia Ocampo S, PA-C  benzonatate (TESSALON) 100 MG capsule Take 1 capsule (100 mg total) by mouth every 8 (eight) hours for 5 days. 10/21/18 10/26/18  Ranetta Armacost S, PA-C  cyclobenzaprine (FLEXERIL) 10 MG tablet Take 1 tablet (10 mg total) by mouth 2 (two) times daily as needed for muscle spasms. 06/07/17   Law, Waylan Boga, PA-C  ibuprofen (ADVIL,MOTRIN) 400 MG tablet Take 1 tablet (400 mg total) by mouth every 6 (six) hours as needed for up to 5 days. 10/21/18 10/26/18  Yaniris Braddock S, PA-C  oseltamivir (TAMIFLU) 75 MG capsule Take 1 capsule (75 mg total) by mouth 2 (two) times daily for 5 days.  10/21/18 10/26/18  Larayah Clute S, PA-C    Family History Family History  Problem Relation Age of Onset  . Seizures Mother     Social History Social History   Tobacco Use  . Smoking status: Never Smoker  . Smokeless tobacco: Former Neurosurgeon  . Tobacco comment: e cigs occasionally  Substance Use Topics  . Alcohol use: No    Comment: occasionally  . Drug use: No     Allergies   Patient has no known allergies.   Review of Systems Review of Systems  Constitutional: Positive for diaphoresis.  HENT: Positive for sore throat. Negative for congestion, ear pain and rhinorrhea.   Eyes: Negative for visual disturbance.  Respiratory: Positive for cough. Negative for shortness of breath.   Cardiovascular: Positive for chest pain (with cough).  Gastrointestinal: Positive for diarrhea. Negative for abdominal pain, constipation, nausea and vomiting.  Genitourinary: Negative for dysuria.  Musculoskeletal: Negative for myalgias.  Skin: Negative for rash.  Neurological: Positive for headaches.     Physical Exam Updated Vital Signs BP (!) 125/106   Pulse 90   Temp 98.3 F (36.8 C) (Oral)   Resp 18   LMP 10/17/2018   SpO2 99%   Physical Exam Vitals signs and nursing note reviewed.  Constitutional:      General: She is not in acute distress.  Appearance: She is well-developed.  HENT:     Head: Normocephalic and atraumatic.     Right Ear: Tympanic membrane normal.     Left Ear: Tympanic membrane normal.     Nose: Nose normal.     Mouth/Throat:     Mouth: Mucous membranes are moist.  Eyes:     Conjunctiva/sclera: Conjunctivae normal.  Neck:     Musculoskeletal: Neck supple.  Cardiovascular:     Rate and Rhythm: Normal rate and regular rhythm.     Pulses: Normal pulses.     Heart sounds: Normal heart sounds. No murmur.  Pulmonary:     Effort: Pulmonary effort is normal. No respiratory distress.     Breath sounds: Normal breath sounds. No stridor. No wheezing or  rhonchi.  Abdominal:     General: Bowel sounds are normal.     Palpations: Abdomen is soft.     Tenderness: There is no abdominal tenderness.  Lymphadenopathy:     Cervical: No cervical adenopathy.  Skin:    General: Skin is warm and dry.  Neurological:     Mental Status: She is alert.  Psychiatric:        Mood and Affect: Mood normal.      ED Treatments / Results  Labs (all labs ordered are listed, but only abnormal results are displayed) Labs Reviewed - No data to display  EKG None  Radiology No results found.  Procedures Procedures (including critical care time)  Medications Ordered in ED Medications - No data to display   Initial Impression / Assessment and Plan / ED Course  I have reviewed the triage vital signs and the nursing notes.  Pertinent labs & imaging results that were available during my care of the patient were reviewed by me and considered in my medical decision making (see chart for details).      Final Clinical Impressions(s) / ED Diagnoses   Final diagnoses:  Flu-like symptoms   Patient with symptoms consistent with influenza.  Vitals are stable, no fever.  No signs of dehydration, tolerating PO's.  Lungs are clear. Due to patient's presentation and physical exam a chest x-ray was not ordered bc likely diagnosis of flu.  Discussed the cost versus benefit of Tamiflu treatment with the patient. Dicussed adverse side effects of tamiflu. Patient will be discharged with instructions to orally hydrate, rest, and use over-the-counter medications such as anti-inflammatories ibuprofen and Aleve for muscle aches and Tylenol for fever.  Patient will also be given a cough suppressant.   ED Discharge Orders         Ordered    oseltamivir (TAMIFLU) 75 MG capsule  2 times daily     10/21/18 0816    benzonatate (TESSALON) 100 MG capsule  Every 8 hours     10/21/18 0816    acetaminophen (TYLENOL) 325 MG tablet  Every 6 hours PRN     10/21/18 0816     ibuprofen (ADVIL,MOTRIN) 400 MG tablet  Every 6 hours PRN     10/21/18 0816           Karrie MeresCouture, Mayo Owczarzak S, PA-C 10/21/18 1536    Derwood KaplanNanavati, Ankit, MD 10/22/18 [phone]

## 2018-11-08 ENCOUNTER — Other Ambulatory Visit: Payer: Medicaid Other

## 2018-11-08 ENCOUNTER — Encounter: Payer: Self-pay | Admitting: Obstetrics & Gynecology

## 2018-11-08 ENCOUNTER — Ambulatory Visit (INDEPENDENT_AMBULATORY_CARE_PROVIDER_SITE_OTHER): Payer: Medicaid Other | Admitting: Obstetrics & Gynecology

## 2018-11-08 ENCOUNTER — Ambulatory Visit (INDEPENDENT_AMBULATORY_CARE_PROVIDER_SITE_OTHER): Payer: Medicaid Other

## 2018-11-08 ENCOUNTER — Other Ambulatory Visit: Payer: Self-pay | Admitting: Obstetrics & Gynecology

## 2018-11-08 ENCOUNTER — Other Ambulatory Visit: Payer: Self-pay

## 2018-11-08 VITALS — BP 109/70 | HR 92 | Wt 217.0 lb

## 2018-11-08 DIAGNOSIS — O0990 Supervision of high risk pregnancy, unspecified, unspecified trimester: Secondary | ICD-10-CM

## 2018-11-08 DIAGNOSIS — Z3A28 28 weeks gestation of pregnancy: Secondary | ICD-10-CM

## 2018-11-08 DIAGNOSIS — O30042 Twin pregnancy, dichorionic/diamniotic, second trimester: Secondary | ICD-10-CM

## 2018-11-08 DIAGNOSIS — O30043 Twin pregnancy, dichorionic/diamniotic, third trimester: Secondary | ICD-10-CM

## 2018-11-08 DIAGNOSIS — Z131 Encounter for screening for diabetes mellitus: Secondary | ICD-10-CM

## 2018-11-08 DIAGNOSIS — Z3483 Encounter for supervision of other normal pregnancy, third trimester: Secondary | ICD-10-CM | POA: Diagnosis not present

## 2018-11-08 DIAGNOSIS — O0993 Supervision of high risk pregnancy, unspecified, third trimester: Secondary | ICD-10-CM

## 2018-11-08 DIAGNOSIS — O0992 Supervision of high risk pregnancy, unspecified, second trimester: Secondary | ICD-10-CM

## 2018-11-08 DIAGNOSIS — Z23 Encounter for immunization: Secondary | ICD-10-CM

## 2018-11-08 DIAGNOSIS — Z331 Pregnant state, incidental: Secondary | ICD-10-CM

## 2018-11-08 DIAGNOSIS — Z87440 Personal history of urinary (tract) infections: Secondary | ICD-10-CM

## 2018-11-08 DIAGNOSIS — Z1389 Encounter for screening for other disorder: Secondary | ICD-10-CM

## 2018-11-08 LAB — POCT URINALYSIS DIPSTICK OB
Blood, UA: NEGATIVE
Glucose, UA: NEGATIVE
KETONES UA: NEGATIVE
Leukocytes, UA: NEGATIVE
Nitrite, UA: NEGATIVE

## 2018-11-08 NOTE — Progress Notes (Signed)
[place] DI/DI twins 51 wks Baby A: cephalic inferior,anterior placenta gr 1,svp of fluid 5 cm,fhr 137 bpm,efw 1277 g 66% Baby B: transverse head right,anterior placenta gr 1,svp of fluid 4.3 cm,fhr 135 bpm,efw 1379 85%,discordance 7.4%

## 2018-11-08 NOTE — Progress Notes (Signed)
   HIGH-RISK PREGNANCY VISIT Patient name: Amanda Perez MRN [id number]  Date of birth: [birth year] Chief Complaint:   High Risk Gestation (PN2 and u/s)  History of Present Illness:   Amanda Perez is a 28 y.o. G57P2002 female at [redacted]w[redacted]d with an Estimated Date of Delivery: 01/31/19 being seen today for ongoing management of a high-risk pregnancy complicated by DiDi twins.  Today she reports no complaints. Contractions: Not present. Vag. Bleeding: None.  Movement: Present. denies leaking of fluid.  Review of Systems:   Pertinent items are noted in HPI Denies abnormal vaginal discharge w/ itching/odor/irritation, headaches, visual changes, shortness of breath, chest pain, abdominal pain, severe nausea/vomiting, or problems with urination or bowel movements unless otherwise stated above. Pertinent History Reviewed:  Reviewed past medical,surgical, social, obstetrical and family history.  Reviewed problem list, medications and allergies. Physical Assessment:   Vitals:   11/08/18 1120  BP: 109/70  Pulse: 92  Weight: 217 lb (98.4 kg)  Body mass index is 38.44 kg/m.           Physical Examination:   General appearance: alert, well appearing, and in no distress  Mental status: alert, oriented to person, place, and time  Skin: warm & dry   Extremities: Edema: Trace    Cardiovascular: normal heart rate noted  Respiratory: normal respiratory effort, no distress  Abdomen: gravid, soft, non-tender  Pelvic: Cervical exam deferred         Fetal Status:     Movement: Present    Fetal Surveillance Testing today: sonogram    Results for orders placed or performed in visit on 11/08/18 (from the past 24 hour(s))  POC Urinalysis Dipstick OB   Collection Time: 11/08/18 11:00 AM  Result Value Ref Range   Color, UA     Clarity, UA     Glucose, UA Negative Negative   Bilirubin, UA     Ketones, UA neg    Spec Grav, UA     Blood, UA neg    pH, UA     POC,PROTEIN,UA Small (1+) Negative, Trace, Small  (1+), Moderate (2+), Large (3+), 4+   Urobilinogen, UA     Nitrite, UA neg    Leukocytes, UA Negative Negative   Appearance     Odor      Assessment & Plan:  1) High-risk pregnancy G3P2002 at [redacted]w[redacted]d with an Estimated Date of Delivery: 01/31/19   2) Twins, DiDi, stable, growth good, 7.4% discordance,     Meds: No orders of the defined types were placed in this encounter.   Labs/procedures today: sonogram today PN2 today  Treatment Plan:  Repeat scan 32 weeks for growth, routine care for DiDi twins  Reviewed: Preterm labor symptoms and general obstetric precautions including but not limited to vaginal bleeding, contractions, leaking of fluid and fetal movement were reviewed in detail with the patient.  All questions were answered.  Follow-up: Return in about 2 weeks (around 11/22/2018) for HROB.  Orders Placed This Encounter  Procedures  . Urine Culture  . Tdap vaccine greater than or equal to 7yo IM  . POC Urinalysis Dipstick OB   Lazaro Arms  11/08/2018 11:35 AM

## 2018-11-09 LAB — GLUCOSE TOLERANCE, 2 HOURS W/ 1HR
GLUCOSE, FASTING: 69 mg/dL (ref 65–91)
Glucose, 1 hour: 112 mg/dL (ref 65–179)
Glucose, 2 hour: 79 mg/dL (ref 65–152)

## 2018-11-09 LAB — CBC
Hematocrit: 32.5 % — ABNORMAL LOW (ref 34.0–46.6)
Hemoglobin: 11.3 g/dL (ref 11.1–15.9)
MCH: 30.5 pg (ref 26.6–33.0)
MCHC: 34.8 g/dL (ref 31.5–35.7)
MCV: 88 fL (ref 79–97)
Platelets: 155 10*3/uL (ref 150–450)
RBC: 3.71 x10E6/uL — ABNORMAL LOW (ref 3.77–5.28)
RDW: 13.5 % (ref 11.7–15.4)
WBC: 6.6 10*3/uL (ref 3.4–10.8)

## 2018-11-09 LAB — HIV ANTIBODY (ROUTINE TESTING W REFLEX): HIV Screen 4th Generation wRfx: NONREACTIVE

## 2018-11-09 LAB — ANTIBODY SCREEN: Antibody Screen: NEGATIVE

## 2018-11-09 LAB — RPR: RPR: NONREACTIVE

## 2018-11-10 LAB — URINE CULTURE

## 2018-11-13 ENCOUNTER — Telehealth: Payer: Self-pay | Admitting: *Deleted

## 2018-11-13 NOTE — Telephone Encounter (Signed)
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 Advised that I would get a note typed up for her and let her know when it is available to pick up. Pt verbalized understanding.

## 2018-11-13 NOTE — Telephone Encounter (Signed)
Patient called stating Dr Despina Hidden has took her out of work, and her Daycare needs notes stating she is out of work. Patient requested for a nurse to return her call. Please advise [phone]

## 2018-11-14 ENCOUNTER — Telehealth: Payer: Self-pay | Admitting: *Deleted

## 2018-11-14 NOTE — Telephone Encounter (Signed)
Pt informed that note for daycare is available for her to pick up at her convenience. Pt verbalized understanding.

## 2018-11-21 ENCOUNTER — Ambulatory Visit (INDEPENDENT_AMBULATORY_CARE_PROVIDER_SITE_OTHER): Payer: Medicaid Other | Admitting: Obstetrics and Gynecology

## 2018-11-21 ENCOUNTER — Other Ambulatory Visit: Payer: Self-pay

## 2018-11-21 VITALS — BP 131/70 | HR 110 | Wt 222.0 lb

## 2018-11-21 DIAGNOSIS — O09299 Supervision of pregnancy with other poor reproductive or obstetric history, unspecified trimester: Secondary | ICD-10-CM

## 2018-11-21 DIAGNOSIS — Z1389 Encounter for screening for other disorder: Secondary | ICD-10-CM

## 2018-11-21 DIAGNOSIS — O1213 Gestational proteinuria, third trimester: Secondary | ICD-10-CM

## 2018-11-21 DIAGNOSIS — Z331 Pregnant state, incidental: Secondary | ICD-10-CM

## 2018-11-21 DIAGNOSIS — Z3A29 29 weeks gestation of pregnancy: Secondary | ICD-10-CM

## 2018-11-21 LAB — POCT URINALYSIS DIPSTICK OB
Glucose, UA: NEGATIVE
Ketones, UA: NEGATIVE
Leukocytes, UA: NEGATIVE
Nitrite, UA: NEGATIVE

## 2018-11-21 NOTE — Progress Notes (Signed)
Patient ID: Amanda Perez, female   DOB: July 16, 1991, 28 y.o.   MRN: [id number]    [facility] PREGNANCY VISIT Patient name: Amanda Perez MRN [id number]  Date of birth: November 12, 1990 Chief Complaint:   High Risk Gestation  History of Present Illness:   Amanda Perez is a 28 y.o. G91P2002 female at [redacted]w[redacted]d with an Estimated Date of Delivery: 01/31/19 being seen today for ongoing management of a high-risk pregnancy complicated by DiDi Twins.  Today she reports no complaints. Her last U/S was 2 weeks ago. She reports some pressure in her lower abdomen and ankle edema. There was protein in her urine today but she is going to give another sample before she leaves.   Contractions: Not present. Vag. Bleeding: None.  Movement: Present. denies leaking of fluid.  Review of Systems:   Pertinent items are noted in HPI Denies abnormal vaginal discharge w/ itching/odor/irritation, headaches, visual changes, shortness of breath, chest pain, abdominal pain, severe nausea/vomiting, or problems with urination or bowel movements unless otherwise stated above. Pertinent History Reviewed:  Reviewed past medical,surgical, social, obstetrical and family history.  Reviewed problem list, medications and allergies. Physical Assessment:   Vitals:   11/21/18 1009  BP: 131/70  Pulse: (!) 110  Weight: 222 lb (100.7 kg)  Body mass index is 39.33 kg/m.           Physical Examination:   General appearance: alert, well appearing, and in no distress and oriented to person, place, and time  Mental status: alert, oriented to person, place, and time, normal mood, behavior, speech, dress, motor activity, and thought processes, affect appropriate to mood  Skin: warm & dry   Extremities: Edema: Trace    Cardiovascular: normal heart rate noted  Respiratory: normal respiratory effort, no distress  Abdomen: gravid, soft, non-tender  Pelvic: Cervical exam deferred         Fetal Status: Fetal Heart Rate (bpm): 144/136  B Fundal  Height: 36 cm Movement: Present    Fetal Surveillance Testing today: Abdominal U/S to confirm FHR 136 / 144 vtx A, transverse B.  Results for orders placed or performed in visit on 11/21/18 (from the past 24 hour(s))  POC Urinalysis Dipstick OB   Collection Time: 11/21/18 10:10 AM  Result Value Ref Range   Color, UA     Clarity, UA     Glucose, UA Negative Negative   Bilirubin, UA     Ketones, UA neg    Spec Grav, UA     Blood, UA small    pH, UA     POC,PROTEIN,UA Moderate (2+) Negative, Trace, Small (1+), Moderate (2+), Large (3+), 4+   Urobilinogen, UA     Nitrite, UA neg    Leukocytes, UA Negative Negative   Appearance     Odor     will send u/a and pr/cr due to 2+ proteinuria on 2 checks. Including midstream collection. 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 Check HGB 28wk, avoid anemia, use TXA at delivery prophylactically.  4 contraception plans : undecided.  Meds: No orders of the defined types were placed in this encounter.  Labs/procedures today: urine for urinalysis, PR/Cr.  Treatment Plan: Repeat scan 32 weeks for growth, routine care for DiDi twins  Reviewed: Preterm labor symptoms and general obstetric precautions including but not limited to vaginal bleeding, contractions, leaking of fluid and fetal movement  were reviewed in detail with the patient.  All questions were answered.  Follow-up: Return in about 2 weeks (around 12/05/2018) for LROB.  Orders Placed This Encounter  Procedures  . Urine Culture  . Urinalysis  . Protein / creatinine ratio, urine  . POC Urinalysis Dipstick OB   By signing my name below, I, Pietro Cassis, attest that this documentation has been prepared under the direction and in the presence of Tilda Burrow, MD. Electronically Signed: Pietro Cassis, Medical Scribe. 11/21/18. 10:24 AM.  I personally performed  the services described in this documentation, which was SCRIBED in my presence. The recorded information has been reviewed and considered accurate. It has been edited as necessary during review. Tilda Burrow, MD

## 2018-11-23 LAB — URINE CULTURE

## 2018-11-29 ENCOUNTER — Other Ambulatory Visit: Payer: Self-pay | Admitting: Obstetrics and Gynecology

## 2018-11-29 DIAGNOSIS — O30043 Twin pregnancy, dichorionic/diamniotic, third trimester: Secondary | ICD-10-CM

## 2018-12-02 ENCOUNTER — Encounter: Payer: Self-pay | Admitting: Advanced Practice Midwife

## 2018-12-02 ENCOUNTER — Encounter: Payer: Medicaid Other | Admitting: Women's Health

## 2018-12-02 ENCOUNTER — Ambulatory Visit (INDEPENDENT_AMBULATORY_CARE_PROVIDER_SITE_OTHER): Payer: Medicaid Other

## 2018-12-02 ENCOUNTER — Ambulatory Visit (INDEPENDENT_AMBULATORY_CARE_PROVIDER_SITE_OTHER): Payer: Medicaid Other | Admitting: Advanced Practice Midwife

## 2018-12-02 ENCOUNTER — Other Ambulatory Visit: Payer: Medicaid Other

## 2018-12-02 ENCOUNTER — Other Ambulatory Visit: Payer: Self-pay

## 2018-12-02 VITALS — BP 124/78 | HR 94 | Wt 220.0 lb

## 2018-12-02 DIAGNOSIS — R801 Persistent proteinuria, unspecified: Secondary | ICD-10-CM

## 2018-12-02 DIAGNOSIS — O30043 Twin pregnancy, dichorionic/diamniotic, third trimester: Secondary | ICD-10-CM

## 2018-12-02 DIAGNOSIS — Z331 Pregnant state, incidental: Secondary | ICD-10-CM

## 2018-12-02 DIAGNOSIS — Z3A31 31 weeks gestation of pregnancy: Secondary | ICD-10-CM

## 2018-12-02 DIAGNOSIS — O0990 Supervision of high risk pregnancy, unspecified, unspecified trimester: Secondary | ICD-10-CM | POA: Diagnosis not present

## 2018-12-02 DIAGNOSIS — O1213 Gestational proteinuria, third trimester: Secondary | ICD-10-CM

## 2018-12-02 DIAGNOSIS — Z1389 Encounter for screening for other disorder: Secondary | ICD-10-CM

## 2018-12-02 DIAGNOSIS — O0993 Supervision of high risk pregnancy, unspecified, third trimester: Secondary | ICD-10-CM

## 2018-12-02 LAB — POCT URINALYSIS DIPSTICK OB
Blood, UA: NEGATIVE
Glucose, UA: NEGATIVE
Ketones, UA: NEGATIVE
LEUKOCYTES UA: NEGATIVE
Nitrite, UA: NEGATIVE

## 2018-12-02 NOTE — Progress Notes (Signed)
HIGH-RISK PREGNANCY VISIT Patient name: Amanda Perez MRN [id number]  Date of birth: [birth year] Chief Complaint:   Routine Prenatal Visit  History of Present Illness:   Amanda Perez is a 28 y.o. G38P2002 female at [redacted]w[redacted]d with an Estimated Date of Delivery: 01/31/19 being seen today for ongoing management of a high-risk pregnancy complicated by multiple gestation.  Today she reports no complaints. Contractions: Not present. Vag. Bleeding: None.  Movement: Present. denies leaking of fluid.  Review of Systems:   Pertinent items are noted in HPI Denies abnormal vaginal discharge w/ itching/odor/irritation, headaches, visual changes, shortness of breath, chest pain, abdominal pain, severe nausea/vomiting, or problems with urination or bowel movements unless otherwise stated above.    Pertinent History Reviewed:  Medical & Surgical Hx:   Past Medical History:  Diagnosis Date  . HSV-2 (herpes simplex virus 2) infection   . Other disorder of menstruation and other abnormal bleeding from female genital tract 07/10/2013   Bleeding daily since IUD inserted  . Pregnant 12/29/2014  . Pregnant    Past Surgical History:  Procedure Laterality Date  . DILATION AND EVACUATION N/A 08/15/2015   Procedure: DILATATION and curretage, repair of laceration;  Surgeon: Tereso Newcomer, MD;  Location: [facility];  Service: Gynecology;  Laterality: N/A;   Family History  Problem Relation Age of Onset  . Hypertension Mother   . Seizures Mother   . Hypertension Paternal Grandmother   . Hypertension Maternal Grandmother     Current Outpatient Medications:  .  Doxylamine-Pyridoxine (DICLEGIS) 10-10 MG TBEC, 2 tabs q hs, if sx persist add 1 tab q am on day 3, if sx persist add 1 tab q afternoon on day 4, Disp: 100 tablet, Rfl: 6 .  Prenat w/o A-[id number] (CITRANATAL ASSURE) 35-1 & 300 MG tablet, One tablet and one capsule daily, Disp: 60 tablet, Rfl: 11 Social History: Reviewed -  reports that she has never  smoked. She has never used smokeless tobacco.   Physical Assessment:   Vitals:   12/02/18 1240  BP: 124/78  Pulse: 94  Weight: 220 lb (99.8 kg)  Body mass index is 38.97 kg/m.           Physical Examination:   General appearance: alert, well appearing, and in no distress  Mental status: alert, oriented to person, place, and time  Skin: warm & dry   Extremities: Edema: Trace    Cardiovascular: normal heart rate noted  Respiratory: normal respiratory effort, no distress  Abdomen: gravid, soft, non-tender  Pelvic: Cervical exam deferred         Fetal Status:     Movement: Present    Fetal Surveillance Testing today: [place] 31+3 wks,DI/DI TWINS,bilat adnexa's wnl BABY A: cephalic,anterior placenta gr 2,svp of fluid 3.4 cm,fhr 140 bpm,1902 g 60% BABY B:transverse head left,anterior gr 2,svp of fluid 5.5,fhr 133 bpm,efw 2133 g 89%  Results for orders placed or performed in visit on 12/02/18 (from the past 24 hour(s))  POC Urinalysis Dipstick OB   Collection Time: 12/02/18 12:39 PM  Result Value Ref Range   Color, UA     Clarity, UA     Glucose, UA Negative Negative   Bilirubin, UA     Ketones, UA neg    Spec Grav, UA     Blood, UA neg    pH, UA     POC,PROTEIN,UA Moderate (2+) Negative, Trace, Small (1+), Moderate (2+), Large (3+), 4+   Urobilinogen, UA     Nitrite, UA neg  Leukocytes, UA Negative Negative   Appearance     Odor      Assessment & Plan:  1) High-risk pregnancy G3P2002 at [redacted]w[redacted]d with an Estimated Date of Delivery: 01/31/19   2) 2+ proteinuria x2 weeks, neg urine culture, .  Treatment Plan:  PreE labs/24 hour urine fu 2 days  3) Di-Di twins, stable.  Treatment Plan:  Weekly BPP @ 35 weeks     Follow-up: Return in about 2 days (around 12/04/2018) for sign BTL form, HROB.  Future Appointments  Date Time Provider Department Center  12/04/2018  9:30 AM Lazaro Arms, MD [facility]  12/26/2018  8:30 AM [facility] [place] [facility] None  12/26/2018  9:30 AM  Jacklyn Shell, CNM [facility]  01/02/2019  8:30 AM [facility] [place] [facility] None  01/02/2019  9:30 AM Lazaro Arms, MD [facility]  01/09/2019  8:30 AM [facility] [place] [facility] None  01/09/2019  9:30 AM Cheral Marker, CNM [facility]    Orders Placed This Encounter  Procedures  . CBC  . Comprehensive metabolic panel  . Protein, urine, 24 hour  . POC Urinalysis Dipstick OB   Jacklyn Shell [facility] 12/02/2018 5:31 PM

## 2018-12-02 NOTE — Progress Notes (Signed)
[place] 31+3 wks,DI/DI TWINS,bilat adnexa's wnl BABY A: cephalic,anterior placenta gr 2,svp of fluid 3.4 cm,fhr 140 bpm,1902 g 60% BABY B:transverse head left,anterior gr 2,svp of fluid 5.5,fhr 133 bpm,efw 2133 g 89%

## 2018-12-02 NOTE — Patient Instructions (Signed)
Hypertension During Pregnancy ° °Hypertension, commonly called high blood pressure, is when the force of blood pumping through your arteries is too strong. Arteries are blood vessels that carry blood from the heart throughout the body. Hypertension during pregnancy can cause problems for you and your baby. Your baby may be born early (prematurely) or may not weigh as much as he or she should at birth. Very bad cases of hypertension during pregnancy can be life-threatening. °Different types of hypertension can occur during pregnancy. These include: °· Chronic hypertension. This happens when: °? You have hypertension before pregnancy and it continues during pregnancy. °? You develop hypertension before you are [redacted] weeks pregnant, and it continues during pregnancy. °· Gestational hypertension. This is hypertension that develops after the 20th week of pregnancy. °· Preeclampsia, also called toxemia of pregnancy. This is a very serious type of hypertension that develops during pregnancy. It can be very dangerous for you and your baby. °? In rare cases, you may develop preeclampsia after giving birth (postpartum preeclampsia). This usually occurs within 48 hours after childbirth but may occur up to 6 weeks after giving birth. °Gestational hypertension and preeclampsia usually go away within 6 weeks after your baby is born. Women who have hypertension during pregnancy have a greater chance of developing hypertension later in life or during future pregnancies. °What are the causes? °The exact cause of hypertension during pregnancy is not known. °What increases the risk? °There are certain factors that make it more likely for you to develop hypertension during pregnancy. These include: °· Having hypertension during a previous pregnancy or prior to pregnancy. °· Being overweight. °· Being age 35 or older. °· Being pregnant for the first time. °· Being pregnant with more than one baby. °· Becoming pregnant using fertilization  methods such as IVF (in vitro fertilization). °· Having diabetes, kidney problems, or systemic lupus erythematosus. °· Having a family history of hypertension. °What are the signs or symptoms? °Chronic hypertension and gestational hypertension rarely cause symptoms. Preeclampsia causes symptoms, which may include: °· Increased protein in your urine. Your health care provider will check for this at every visit before you give birth (prenatal visit). °· Severe headaches. °· Sudden weight gain. °· Swelling of the hands, face, legs, and feet. °· Nausea and vomiting. °· Vision problems, such as blurred or double vision. °· Numbness in the face, arms, legs, and feet. °· Dizziness. °· Slurred speech. °· Sensitivity to bright lights. °· Abdominal pain. °· Convulsions or seizures. °How is this diagnosed? °You may be diagnosed with hypertension during a routine prenatal exam. At each prenatal visit, you may: °· Have a urine test to check for high amounts of protein in your urine. °· Have your blood pressure checked. A blood pressure reading is given as two numbers, such as "120 over 80" (or 120/80). The first ("top") number is a measure of the pressure in your arteries when your heart beats (systolic pressure). The second ("bottom") number is a measure of the pressure in your arteries as your heart relaxes between beats (diastolic pressure). Blood pressure is measured in a unit called mm Hg. For most women, a normal blood pressure reading is: °? Systolic: below 120. °? Diastolic: below 80. °The type of hypertension that you are diagnosed with depends on your test results and when your symptoms developed. °· Chronic hypertension is usually diagnosed before 20 weeks of pregnancy. °· Gestational hypertension is usually diagnosed after 20 weeks of pregnancy. °· Hypertension with high amounts of protein in   the urine is diagnosed as preeclampsia. °· Blood pressure measurements that stay above 160 systolic, or above 110 diastolic,  are signs of severe preeclampsia. °How is this treated? °Treatment for hypertension during pregnancy varies depending on the type of hypertension you have and how serious it is. °· If you take medicines called ACE inhibitors to treat chronic hypertension, you may need to switch medicines. ACE inhibitors should not be taken during pregnancy. °· If you have gestational hypertension, you may need to take blood pressure medicine. °· If you are at risk for preeclampsia, your health care provider may recommend that you take a low-dose aspirin during your pregnancy. °· If you have severe preeclampsia, you may need to be hospitalized so you and your baby can be monitored closely. You may also need to take medicine (magnesium sulfate) to prevent seizures and to lower blood pressure. This medicine may be given as an injection or through an IV. °· In some cases, if your condition gets worse, you may need to deliver your baby early. °Follow these instructions at home: °Eating and drinking ° °· Drink enough fluid to keep your urine pale yellow. °· Avoid caffeine. °Lifestyle °· Do not use any products that contain nicotine or tobacco, such as cigarettes and e-cigarettes. If you need help quitting, ask your health care provider. °· Do not use alcohol or drugs. °· Avoid stress as much as possible. Rest and get plenty of sleep. °General instructions °· Take over-the-counter and prescription medicines only as told by your health care provider. °· While lying down, lie on your left side. This keeps pressure off your major blood vessels. °· While sitting or lying down, raise (elevate) your feet. Try putting some pillows under your lower legs. °· Exercise regularly. Ask your health care provider what kinds of exercise are best for you. °· Keep all prenatal and follow-up visits as told by your health care provider. This is important. °Contact a health care provider if: °· You have symptoms that your health care provider told you may  require more treatment or monitoring, such as: °? Nausea or vomiting. °? Headache. °Get help right away if you have: °· Severe abdominal pain that does not get better with treatment. °· A severe headache that does not get better. °· Vomiting that does not get better. °· Sudden, rapid weight gain. °· Sudden swelling in your hands, ankles, or face. °· Vaginal bleeding. °· Blood in your urine. °· Fewer movements from your baby than usual. °· Blurred or double vision. °· Muscle twitching or sudden muscle tightening (spasms). °· Shortness of breath. °· Blue fingernails or lips. °Summary °· Hypertension, commonly called high blood pressure, is when the force of blood pumping through your arteries is too strong. °· Hypertension during pregnancy can cause problems for you and your baby. °· Treatment for hypertension during pregnancy varies depending on the type of hypertension you have and how serious it is. °· Get help right away if you have symptoms that your health care provider told you to watch for. °This information is not intended to replace advice given to you by your health care provider. Make sure you discuss any questions you have with your health care provider. °Document Released: 05/16/2011 Document Revised: 08/14/2017 Document Reviewed: 02/11/2016 °Elsevier Interactive Patient Education © 2019 [employer] ° °

## 2018-12-03 LAB — COMPREHENSIVE METABOLIC PANEL
A/G RATIO: 1.5 (ref 1.2–2.2)
ALT: 9 IU/L (ref 0–32)
AST: 16 IU/L (ref 0–40)
Albumin: 3.5 g/dL — ABNORMAL LOW (ref 3.9–5.0)
Alkaline Phosphatase: 97 IU/L (ref 39–117)
BUN/Creatinine Ratio: 6 — ABNORMAL LOW (ref 9–23)
BUN: 3 mg/dL — ABNORMAL LOW (ref 6–20)
Bilirubin Total: 0.3 mg/dL (ref 0.0–1.2)
CALCIUM: 8.9 mg/dL (ref 8.7–10.2)
CO2: 21 mmol/L (ref 20–29)
Chloride: 107 mmol/L — ABNORMAL HIGH (ref 96–106)
Creatinine, Ser: 0.52 mg/dL — ABNORMAL LOW (ref 0.57–1.00)
GFR calc Af Amer: 152 mL/min/{1.73_m2} (ref >59)
GFR calc non Af Amer: 132 mL/min/{1.73_m2} (ref >59)
Globulin, Total: 2.3 g/dL (ref 1.5–4.5)
Glucose: 72 mg/dL (ref 65–99)
Potassium: 3.9 mmol/L (ref 3.5–5.2)
Sodium: 141 mmol/L (ref 134–144)
Total Protein: 5.8 g/dL — ABNORMAL LOW (ref 6.0–8.5)

## 2018-12-03 LAB — CBC
Hematocrit: 32.2 % — ABNORMAL LOW (ref 34.0–46.6)
Hemoglobin: 11.5 g/dL (ref 11.1–15.9)
MCH: 31 pg (ref 26.6–33.0)
MCHC: 35.7 g/dL (ref 31.5–35.7)
MCV: 87 fL (ref 79–97)
Platelets: 156 10*3/uL (ref 150–450)
RBC: 3.71 x10E6/uL — ABNORMAL LOW (ref 3.77–5.28)
RDW: 13.4 % (ref 11.7–15.4)
WBC: 6.9 10*3/uL (ref 3.4–10.8)

## 2018-12-04 ENCOUNTER — Other Ambulatory Visit (HOSPITAL_COMMUNITY): Payer: Self-pay | Admitting: Advanced Practice Midwife

## 2018-12-04 ENCOUNTER — Other Ambulatory Visit: Payer: Self-pay

## 2018-12-04 ENCOUNTER — Ambulatory Visit (INDEPENDENT_AMBULATORY_CARE_PROVIDER_SITE_OTHER): Payer: Medicaid Other | Admitting: Obstetrics & Gynecology

## 2018-12-04 VITALS — BP 127/72 | HR 99 | Wt 222.0 lb

## 2018-12-04 DIAGNOSIS — O30042 Twin pregnancy, dichorionic/diamniotic, second trimester: Secondary | ICD-10-CM

## 2018-12-04 DIAGNOSIS — O0993 Supervision of high risk pregnancy, unspecified, third trimester: Secondary | ICD-10-CM

## 2018-12-04 DIAGNOSIS — Z3A31 31 weeks gestation of pregnancy: Secondary | ICD-10-CM

## 2018-12-04 DIAGNOSIS — Z331 Pregnant state, incidental: Secondary | ICD-10-CM

## 2018-12-04 DIAGNOSIS — R801 Persistent proteinuria, unspecified: Secondary | ICD-10-CM

## 2018-12-04 DIAGNOSIS — O1213 Gestational proteinuria, third trimester: Secondary | ICD-10-CM

## 2018-12-04 DIAGNOSIS — O0990 Supervision of high risk pregnancy, unspecified, unspecified trimester: Secondary | ICD-10-CM

## 2018-12-04 DIAGNOSIS — Z1389 Encounter for screening for other disorder: Secondary | ICD-10-CM

## 2018-12-04 DIAGNOSIS — O30043 Twin pregnancy, dichorionic/diamniotic, third trimester: Secondary | ICD-10-CM

## 2018-12-04 LAB — POCT URINALYSIS DIPSTICK OB
Blood, UA: NEGATIVE
Glucose, UA: NEGATIVE
Ketones, UA: NEGATIVE
Nitrite, UA: NEGATIVE

## 2018-12-04 NOTE — Progress Notes (Signed)
   HIGH-RISK PREGNANCY VISIT Patient name: Amanda Perez MRN [id number]  Date of birth: 1991-09-02 Chief Complaint:   Routine Prenatal Visit  History of Present Illness:   Amanda Perez is a 28 y.o. G63P2002 female at [redacted]w[redacted]d with an Estimated Date of Delivery: 01/31/19 being seen today for ongoing management of a high-risk pregnancy complicated by [name] twins with proteinuria, normal BP at this point.  Today she reports no complaints. Contractions: Not present. Vag. Bleeding: None.  Movement: Present. denies leaking of fluid.  Review of Systems:   Pertinent items are noted in HPI Denies abnormal vaginal discharge w/ itching/odor/irritation, headaches, visual changes, shortness of breath, chest pain, abdominal pain, severe nausea/vomiting, or problems with urination or bowel movements unless otherwise stated above. Pertinent History Reviewed:  Reviewed past medical,surgical, social, obstetrical and family history.  Reviewed problem list, medications and allergies. Physical Assessment:   Vitals:   12/04/18 0933  BP: 127/72  Pulse: 99  Weight: 222 lb (100.7 kg)  Body mass index is 39.33 kg/m.           Physical Examination:   General appearance: alert, well appearing, and in no distress  Mental status: alert, oriented to person, place, and time, normal mood, behavior, speech, dress, motor activity, and thought processes  Skin: warm & dry   Extremities: Edema: Trace    Cardiovascular: normal heart rate noted  Respiratory: normal respiratory effort, no distress  Abdomen: gravid, soft, non-tender  Pelvic: Cervical exam deferred         Fetal Status: Fetal Heart Rate (bpm): 140/133   Movement: Present    Fetal Surveillance Testing today: none   Results for orders placed or performed in visit on 12/04/18 (from the past 24 hour(s))  POC Urinalysis Dipstick OB   Collection Time: 12/04/18  9:37 AM  Result Value Ref Range   Color, UA     Clarity, UA     Glucose, UA Negative Negative   Bilirubin, UA     Ketones, UA neg    Spec Grav, UA     Blood, UA neg    pH, UA     POC,PROTEIN,UA Small (1+) Negative, Trace, Small (1+), Moderate (2+), Large (3+), 4+   Urobilinogen, UA     Nitrite, UA neg    Leukocytes, UA Small (1+) (A) Negative   Appearance     Odor      Assessment & Plan:  1) High-risk pregnancy G3P2002 at [redacted]w[redacted]d with an Estimated Date of Delivery: 01/31/19   2) DiDi twins with excellent growth, stable  3) Proteinuria, stable, a bit improved today, BP normal, labs are normal, 24 hour urine is pending from tody  Meds: No orders of the defined types were placed in this encounter.   Labs/procedures today: 24 hour urine  Treatment Plan:  Short interval follow up, 1 week to check BP  Reviewed: Preterm labor symptoms and general obstetric precautions including but not limited to vaginal bleeding, contractions, leaking of fluid and fetal movement were reviewed in detail with the patient.  All questions were answered.  Follow-up: Return in about 1 week (around 12/11/2018) for HROB, definite in office appointment is needed.  Orders Placed This Encounter  Procedures  . POC Urinalysis Dipstick OB   Lazaro Arms  12/04/2018 9:52 AM

## 2018-12-05 LAB — PROTEIN, URINE, 24 HOUR
PROTEIN UR: 38.1 mg/dL
Protein, 24H Urine: 476 mg/24 hr — ABNORMAL HIGH (ref 30–150)

## 2018-12-10 ENCOUNTER — Other Ambulatory Visit: Payer: Self-pay | Admitting: Obstetrics & Gynecology

## 2018-12-10 DIAGNOSIS — O30043 Twin pregnancy, dichorionic/diamniotic, third trimester: Secondary | ICD-10-CM

## 2018-12-10 DIAGNOSIS — O1213 Gestational proteinuria, third trimester: Secondary | ICD-10-CM

## 2018-12-11 ENCOUNTER — Other Ambulatory Visit: Payer: Self-pay

## 2018-12-11 ENCOUNTER — Encounter: Payer: Self-pay | Admitting: Obstetrics and Gynecology

## 2018-12-11 ENCOUNTER — Ambulatory Visit (INDEPENDENT_AMBULATORY_CARE_PROVIDER_SITE_OTHER): Payer: Medicaid Other

## 2018-12-11 ENCOUNTER — Ambulatory Visit (INDEPENDENT_AMBULATORY_CARE_PROVIDER_SITE_OTHER): Payer: Medicaid Other | Admitting: Obstetrics and Gynecology

## 2018-12-11 VITALS — BP 106/70 | HR 102 | Temp 98.1°F | Wt 225.2 lb

## 2018-12-11 DIAGNOSIS — O1213 Gestational proteinuria, third trimester: Secondary | ICD-10-CM

## 2018-12-11 DIAGNOSIS — O26893 Other specified pregnancy related conditions, third trimester: Secondary | ICD-10-CM

## 2018-12-11 DIAGNOSIS — O0993 Supervision of high risk pregnancy, unspecified, third trimester: Secondary | ICD-10-CM

## 2018-12-11 DIAGNOSIS — N898 Other specified noninflammatory disorders of vagina: Secondary | ICD-10-CM

## 2018-12-11 DIAGNOSIS — Z331 Pregnant state, incidental: Secondary | ICD-10-CM

## 2018-12-11 DIAGNOSIS — Z3A32 32 weeks gestation of pregnancy: Secondary | ICD-10-CM

## 2018-12-11 DIAGNOSIS — O0990 Supervision of high risk pregnancy, unspecified, unspecified trimester: Secondary | ICD-10-CM

## 2018-12-11 DIAGNOSIS — O30043 Twin pregnancy, dichorionic/diamniotic, third trimester: Secondary | ICD-10-CM

## 2018-12-11 DIAGNOSIS — Z1389 Encounter for screening for other disorder: Secondary | ICD-10-CM

## 2018-12-11 LAB — POCT WET PREP (WET MOUNT): Trichomonas Wet Prep HPF POC: ABSENT

## 2018-12-11 LAB — POCT URINALYSIS DIPSTICK OB
Glucose, UA: NEGATIVE
Ketones, UA: NEGATIVE
Nitrite, UA: NEGATIVE

## 2018-12-11 MED ORDER — MICONAZOLE NITRATE 2 % VA CREA: 1.0000 | TOPICAL_CREAM | Freq: Every day | VAGINAL | 0 refills | Status: DC

## 2018-12-11 MED ORDER — MICONAZOLE NITRATE 2 % VA CREA: 1.0000 | TOPICAL_CREAM | Freq: Every day | VAGINAL | Status: DC

## 2018-12-11 NOTE — Progress Notes (Signed)
Patient ID: [name], female   DOB: 03-26-1991, 28 y.o.   MRN: [id number]    [facility] PREGNANCY VISIT Patient name: Amanda Perez MRN [id number]  Date of birth: [birth year] Chief Complaint:   High Risk Gestation ([place] today; + pressure; mucus vaginal discharge )  History of Present Illness:   Amanda Perez is a 28 y.o. 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  Today she reports pelvic pressure & vaginal discharge. Believes she has been losing some of her mucous plug everyday for th past week. No odor or discomfort. Contractions: Irregular. Vag. Bleeding: None.  Movement: Present. denies leaking of fluid.  Review of Systems:   Pertinent items are noted in HPI Denies abnormal headaches, visual changes, shortness of breath, chest pain, abdominal pain, severe nausea/vomiting, or problems with urination or bowel movements unless otherwise stated above. Pertinent History Reviewed:  Reviewed past medical,surgical, social, obstetrical and family history.  Reviewed problem list, medications and allergies. Physical Assessment:   Vitals:   12/11/18 1553  BP: 106/70  Pulse: (!) 102  Temp: 98.1 F (36.7 C)  Weight: 225 lb 3.2 oz (102.2 kg)  Body mass index is 39.89 kg/m.           Physical Examination:   General appearance: alert, well appearing, and in no distress  Mental status: alert, oriented to person, place, and time, normal mood, behavior, speech, dress, motor activity, and thought processes, affect appropriate to mood  Skin: warm & dry   Extremities: Edema: Trace    Cardiovascular: normal heart rate noted  Respiratory: normal respiratory effort, no distress  Abdomen: gravid, soft, non-tender  Pelvic: Cervical exam performed cervix: posterior, long, closed         Wet Prep: few clue cells, neg trich, few yeast Fetal Status:     Movement: Present     Fetal Surveillance Testing today: [place] DI/DI TWINS: 32+5 wks,normal ovaries bilat BABY A:cephalic,anterior placenta gr 3,fhr 144 bpm,BPP 8/8,SVP of fluid 4.2 cm BABY B:transverse head left,anterior placenta gr 3,fhr 148 bpm,svp of fluid 6.2 cm,BPP 8/8   Results for orders placed or performed in visit on 12/11/18 (from the past 24 hour(s))  POC Urinalysis Dipstick OB   Collection Time: 12/11/18  3:54 PM  Result Value Ref Range   Color, UA     Clarity, UA     Glucose, UA Negative Negative   Bilirubin, UA     Ketones, UA neg    Spec Grav, UA     Blood, UA 2+    pH, UA     POC,PROTEIN,UA Moderate (2+) Negative, Trace, Small (1+), Moderate (2+), Large (3+), 4+   Urobilinogen, UA     Nitrite, UA neg    Leukocytes, UA Moderate (2+) (A) Negative   Appearance     Odor      Assessment & Plan:  1) High-risk pregnancy G3P2002 at [redacted]w[redacted]d with an Estimated Date of Delivery: 01/31/19   2) DiDi , stable  3) BV   4) Yeast infection  Meds: No orders of the defined types were placed in this encounter.   Labs/procedures today: u/s BPP, both babies 8/8  Treatment Plan:   1. C&S 2. Monistat  Reviewed: Preterm labor symptoms and general obstetric precautions including but not limited to vaginal bleeding, contractions, leaking of fluid and fetal movement were reviewed in detail with the patient.  All questions were answered.  Follow-up: No follow-ups on file.  Orders Placed This Encounter  Procedures  . POC Urinalysis Dipstick OB   By signing my name below, I, Arnette Norris, attest that this documentation has been prepared under the direction and in the presence of Tilda Burrow, MD. Electronically Signed: Arnette Norris Medical Scribe. 12/11/18. 4:05 PM.  I personally performed the services described in this documentation, which was SCRIBED in my presence. The recorded information has been reviewed and considered accurate. It has been edited as necessary during review. Tilda Burrow, MD

## 2018-12-11 NOTE — Progress Notes (Signed)
[place] DI/DI TWINS: 32+5 wks,normal ovaries bilat BABY A:cephalic,anterior placenta gr 3,fhr 144 bpm,BPP 8/8,SVP of fluid 4.2 cm BABY B:transverse head left,anterior placenta gr 3,fhr 148 bpm,svp of fluid 6.2 cm,BPP 8/8

## 2018-12-12 DIAGNOSIS — O1213 Gestational proteinuria, third trimester: Secondary | ICD-10-CM | POA: Diagnosis not present

## 2018-12-13 LAB — URINALYSIS, ROUTINE W REFLEX MICROSCOPIC
Bilirubin, UA: NEGATIVE
Glucose, UA: NEGATIVE
Ketones, UA: NEGATIVE
Nitrite, UA: NEGATIVE
Specific Gravity, UA: 1.027 (ref 1.005–1.030)
Urobilinogen, Ur: 1 mg/dL (ref 0.2–1.0)
pH, UA: 6.5 (ref 5.0–7.5)

## 2018-12-13 LAB — MICROSCOPIC EXAMINATION
Casts: NONE SEEN /lpf
Epithelial Cells (non renal): >10 /hpf — AB (ref 0–10)

## 2018-12-14 LAB — URINE CULTURE

## 2018-12-16 ENCOUNTER — Other Ambulatory Visit: Payer: Self-pay

## 2018-12-16 ENCOUNTER — Telehealth: Payer: Self-pay | Admitting: *Deleted

## 2018-12-16 ENCOUNTER — Other Ambulatory Visit: Payer: Self-pay | Admitting: Obstetrics and Gynecology

## 2018-12-16 ENCOUNTER — Other Ambulatory Visit: Payer: Medicaid Other

## 2018-12-16 DIAGNOSIS — R801 Persistent proteinuria, unspecified: Secondary | ICD-10-CM

## 2018-12-16 DIAGNOSIS — O0990 Supervision of high risk pregnancy, unspecified, unspecified trimester: Secondary | ICD-10-CM

## 2018-12-16 DIAGNOSIS — O30043 Twin pregnancy, dichorionic/diamniotic, third trimester: Secondary | ICD-10-CM

## 2018-12-16 NOTE — Telephone Encounter (Signed)
Patient notified per Dr Emelda Fear that she needs repeat PIH labs and 24 hour urine.  Advised patient to come pick up container today and start tomorrow.  Verbalized understanding.

## 2018-12-16 NOTE — Addendum Note (Signed)
Addended by: Moss Mc on: 12/16/2018 11:44 AM   Modules accepted: Orders

## 2018-12-18 ENCOUNTER — Other Ambulatory Visit: Payer: Self-pay

## 2018-12-18 ENCOUNTER — Ambulatory Visit (INDEPENDENT_AMBULATORY_CARE_PROVIDER_SITE_OTHER): Payer: Medicaid Other | Admitting: Obstetrics and Gynecology

## 2018-12-18 ENCOUNTER — Encounter: Payer: Self-pay | Admitting: Obstetrics and Gynecology

## 2018-12-18 ENCOUNTER — Ambulatory Visit (INDEPENDENT_AMBULATORY_CARE_PROVIDER_SITE_OTHER): Payer: Medicaid Other

## 2018-12-18 VITALS — BP 118/78 | HR 101 | Temp 98.2°F | Wt 227.4 lb

## 2018-12-18 DIAGNOSIS — O30043 Twin pregnancy, dichorionic/diamniotic, third trimester: Secondary | ICD-10-CM | POA: Diagnosis not present

## 2018-12-18 DIAGNOSIS — Z3A33 33 weeks gestation of pregnancy: Secondary | ICD-10-CM

## 2018-12-18 DIAGNOSIS — Z1389 Encounter for screening for other disorder: Secondary | ICD-10-CM

## 2018-12-18 DIAGNOSIS — Z331 Pregnant state, incidental: Secondary | ICD-10-CM

## 2018-12-18 DIAGNOSIS — R801 Persistent proteinuria, unspecified: Secondary | ICD-10-CM | POA: Diagnosis not present

## 2018-12-18 DIAGNOSIS — O0990 Supervision of high risk pregnancy, unspecified, unspecified trimester: Secondary | ICD-10-CM

## 2018-12-18 DIAGNOSIS — O0993 Supervision of high risk pregnancy, unspecified, third trimester: Secondary | ICD-10-CM

## 2018-12-18 LAB — POCT URINALYSIS DIPSTICK OB
Blood, UA: 1
Glucose, UA: NEGATIVE
Ketones, UA: NEGATIVE
Leukocytes, UA: NEGATIVE
Nitrite, UA: NEGATIVE

## 2018-12-18 NOTE — Progress Notes (Signed)
High Risk Pregnancy HROB Diagnosis(es):   Twin pregnancy DI/DI with symmetric fetal growth, history of postpartum hemorrhage currently pregnant, proteinuria and third trimester with normal pressures  G3P2002 [redacted]w[redacted]d Estimated Date of Delivery: 01/31/19    HPI: The patient is being seen today for ongoing management of see above. Today she reports no headache scotoma blurred vision.  She is satisfied with how she is doing Patient reports good fetal movement, denies any bleeding and no rupture of membranes symptoms or regular contractions.   BP weight and urine results reviewed and noted. Blood pressure 118/78, pulse (!) 101, temperature 98.2 F (36.8 C), weight 227 lb 6.4 oz (103.1 kg), last menstrual period 04/26/2018.  Fundal Height: 43 cm Fetal Heart rate: 138 A  /143 B Physical Examination: Abdomen -gravid uterus firm abdomen good tone nontender                                     Pelvic -                                      Edema: 2+ ankle edema                                    reflexes 0-1+  Urinalysis:NEGATIVE for white cells, esterase                 POSITIVE for 1 + proteinuria, 1+ blood  Fetal Surveillance Testing today:   [place] 33+5 wks,DI/DI twins,bilat adnexa's wnl BABY A: cephalic,anterior placenta gr 3,BPP 8/8,SVP of fluid 3.5 cm,fhr 138 bpm BABY B:transverse head left,anterior placenta gr 3,BPP 8/8,svp of fluid 4.7 cm,fhr 147 bpm  Amber J Baldo Ash 12/18/2018 11:10 AM  Lab and sonogram results have been reviewed. Comments: Fetal wellbeing indicated by BPP 8/8, persistent proteinuria  Assessment:  1.  Pregnancy at [redacted]w[redacted]d,  G3P2002   :                          2.  Di/Di twins with symmetric growth, BPP 8/8 x 2                        3.  Persistent proteinuria stable  Medication(s) Plans: None  Treatment Plan: Continue weekly BPP's  Follow up in 1 weeks for appointment for high risk OB care, BPP, monthly growth ultrasound

## 2018-12-18 NOTE — Progress Notes (Signed)
[place] 33+5 wks,DI/DI twins,bilat adnexa's wnl BABY A: cephalic,anterior placenta gr 3,BPP 8/8,SVP of fluid 3.5 cm,fhr 138 bpm BABY B:transverse head left,anterior placenta gr 3,BPP 8/8,svp of fluid 4.7 cm,fhr 147 bpm

## 2018-12-19 ENCOUNTER — Encounter: Payer: Self-pay | Admitting: Obstetrics and Gynecology

## 2018-12-19 DIAGNOSIS — O1213 Gestational proteinuria, third trimester: Secondary | ICD-10-CM | POA: Insufficient documentation

## 2018-12-19 LAB — CBC
Hematocrit: 32.9 % — ABNORMAL LOW (ref 34.0–46.6)
Hemoglobin: 11.5 g/dL (ref 11.1–15.9)
MCH: 31.4 pg (ref 26.6–33.0)
MCHC: 35 g/dL (ref 31.5–35.7)
MCV: 90 fL (ref 79–97)
Platelets: 161 10*3/uL (ref 150–450)
RBC: 3.66 x10E6/uL — ABNORMAL LOW (ref 3.77–5.28)
RDW: 13.6 % (ref 11.7–15.4)
WBC: 6.6 10*3/uL (ref 3.4–10.8)

## 2018-12-19 LAB — COMPREHENSIVE METABOLIC PANEL
ALT: 11 IU/L (ref 0–32)
AST: 18 IU/L (ref 0–40)
Albumin/Globulin Ratio: 1.3 (ref 1.2–2.2)
Albumin: 3.3 g/dL — ABNORMAL LOW (ref 3.9–5.0)
Alkaline Phosphatase: 109 IU/L (ref 39–117)
BUN/Creatinine Ratio: 9 (ref 9–23)
BUN: 4 mg/dL — ABNORMAL LOW (ref 6–20)
Bilirubin Total: 0.3 mg/dL (ref 0.0–1.2)
CO2: 19 mmol/L — ABNORMAL LOW (ref 20–29)
Calcium: 9.1 mg/dL (ref 8.7–10.2)
Chloride: 106 mmol/L (ref 96–106)
Creatinine, Ser: 0.47 mg/dL — ABNORMAL LOW (ref 0.57–1.00)
GFR calc Af Amer: 158 mL/min/{1.73_m2} (ref >59)
GFR calc non Af Amer: 137 mL/min/{1.73_m2} (ref >59)
Globulin, Total: 2.6 g/dL (ref 1.5–4.5)
Glucose: 74 mg/dL (ref 65–99)
Potassium: 3.8 mmol/L (ref 3.5–5.2)
Sodium: 139 mmol/L (ref 134–144)
Total Protein: 5.9 g/dL — ABNORMAL LOW (ref 6.0–8.5)

## 2018-12-19 LAB — PROTEIN, URINE, 24 HOUR
Protein, 24H Urine: 551 mg/24 hr — ABNORMAL HIGH (ref 30–150)
Protein, Ur: 44.1 mg/dL

## 2018-12-19 LAB — PROTEIN / CREATININE RATIO, URINE
Creatinine, Urine: 111.3 mg/dL
Protein, Ur: 43.1 mg/dL
Protein/Creat Ratio: 387 mg/g creat — ABNORMAL HIGH (ref 0–200)

## 2018-12-23 ENCOUNTER — Telehealth: Payer: Self-pay | Admitting: Obstetrics and Gynecology

## 2018-12-23 ENCOUNTER — Telehealth: Payer: Self-pay | Admitting: *Deleted

## 2018-12-23 NOTE — Telephone Encounter (Signed)
Pt states since Thursday she has been having swelling in both ankles and requesting to speak with a nurse regarding.

## 2018-12-23 NOTE — Telephone Encounter (Signed)
Patient states she has had increased swelling in her feet since Thursday.  Encouraged to wear compression hose if she has them and to keep feet elevated when not up.  Could even put a pillow on the foot of her bed and elevate feet in bed.  Pt verbalized understanding with no further questions.

## 2018-12-25 ENCOUNTER — Telehealth: Payer: Self-pay | Admitting: *Deleted

## 2018-12-25 ENCOUNTER — Other Ambulatory Visit: Payer: Self-pay | Admitting: Obstetrics and Gynecology

## 2018-12-25 DIAGNOSIS — O30003 Twin pregnancy, unspecified number of placenta and unspecified number of amniotic sacs, third trimester: Secondary | ICD-10-CM

## 2018-12-25 NOTE — Telephone Encounter (Signed)
Spoke with pt letting her know no visitors or children at tomorrow's appt. Also, pt hasn't come in contact with someone in the last month that has been confirmed or suspected of having Covid-19 nor is she experiencing any symptoms herself. Pt plans on coming to appt. JSY 

## 2018-12-26 ENCOUNTER — Other Ambulatory Visit: Payer: Self-pay

## 2018-12-26 ENCOUNTER — Encounter: Payer: Self-pay | Admitting: Obstetrics and Gynecology

## 2018-12-26 ENCOUNTER — Ambulatory Visit (INDEPENDENT_AMBULATORY_CARE_PROVIDER_SITE_OTHER): Payer: Medicaid Other

## 2018-12-26 ENCOUNTER — Ambulatory Visit (INDEPENDENT_AMBULATORY_CARE_PROVIDER_SITE_OTHER): Payer: Medicaid Other | Admitting: Obstetrics and Gynecology

## 2018-12-26 VITALS — BP 112/76 | HR 93 | Temp 98.1°F | Wt 231.2 lb

## 2018-12-26 DIAGNOSIS — O0990 Supervision of high risk pregnancy, unspecified, unspecified trimester: Secondary | ICD-10-CM

## 2018-12-26 DIAGNOSIS — O30003 Twin pregnancy, unspecified number of placenta and unspecified number of amniotic sacs, third trimester: Secondary | ICD-10-CM | POA: Diagnosis not present

## 2018-12-26 DIAGNOSIS — Z331 Pregnant state, incidental: Secondary | ICD-10-CM

## 2018-12-26 DIAGNOSIS — Z3A35 35 weeks gestation of pregnancy: Secondary | ICD-10-CM | POA: Diagnosis not present

## 2018-12-26 DIAGNOSIS — O1213 Gestational proteinuria, third trimester: Secondary | ICD-10-CM

## 2018-12-26 DIAGNOSIS — O30043 Twin pregnancy, dichorionic/diamniotic, third trimester: Secondary | ICD-10-CM

## 2018-12-26 DIAGNOSIS — O1211 Gestational proteinuria, first trimester: Secondary | ICD-10-CM | POA: Diagnosis not present

## 2018-12-26 DIAGNOSIS — O0993 Supervision of high risk pregnancy, unspecified, third trimester: Secondary | ICD-10-CM

## 2018-12-26 DIAGNOSIS — Z1389 Encounter for screening for other disorder: Secondary | ICD-10-CM

## 2018-12-26 DIAGNOSIS — Z3A34 34 weeks gestation of pregnancy: Secondary | ICD-10-CM

## 2018-12-26 LAB — POCT URINALYSIS DIPSTICK OB
Glucose, UA: NEGATIVE
Ketones, UA: NEGATIVE
Nitrite, UA: NEGATIVE

## 2018-12-26 NOTE — Progress Notes (Signed)
HIGH-RISK PREGNANCY VISIT Patient name: Amanda SettlerBriana Kommer MRN [id number]  Date of birth: 05/15/1991 Chief Complaint:   High Risk Gestation ([place] today)  History of Present Illness:   Amanda SettlerBriana Giannini is a 28 y.o. 533P2002 female at 6778w6d with an Estimated Date of Delivery: 01/31/19 being seen today for ongoing management of a high-risk pregnancy complicated by D I-DI twin pregnancy, with symmetric growth .  Notable also for proteinuria without other signs and symptoms of preeclampsia so far, being followed with weekly BPP's Today she reports contractions since Last visit have been irregular. Contractions: Irregular. Vag. Bleeding: None.  Movement: Present. denies leaking of fluid.  Review of Systems:   History of postpartum hemorrhage with last pregnancy, to receive TXA prophylaxis Also GBS positive urine culture Contraception, considering tubal Denies abnormal vaginal discharge w/ itching/odor/irritation, headaches, visual changes, shortness of breath, chest pain, abdominal pain, severe nausea/vomiting, or problems with urination or bowel movements unless otherwise stated above. Pertinent History Reviewed:  Reviewed past medical,surgical, social, obstetrical and family history.  Reviewed problem list, medications and allergies. Physical Assessment:   Vitals:   12/26/18 0937  BP: 112/76  Pulse: 93  Temp: 98.1 F (36.7 C)  Weight: 231 lb 3.2 oz (104.9 kg)  Body mass index is 40.96 kg/m.           Physical Examination:   General appearance: alert, well appearing, and in no distress, oriented to person, place, and time and normal appearing weight  Mental status: alert, oriented to person, place, and time  Skin: warm & dry   Extremities: Edema: Moderate pitting, indentation subsides rapidly    Cardiovascular: normal heart rate noted  Respiratory: normal respiratory effort, no distress  Abdomen: gravid, soft, non-tender  Pelvic: Cervical exam deferred         Fetal Status:     Movement:  Present    Fetal Surveillance Testing today: BPP x2 [place] 34+6 wks,DI/DI twins,normal ovaries  BABY A: cephalic,anterior placenta gr 3,BPP 8/8,fhr 130 bpm,svp of fluid 3.1 cm BABY B: transverse head left,anterior placenta gr 3,BPP 8/8,FHR 138 bpm,svp of fluid 5.8 cm,    Results for orders placed or performed in visit on 12/26/18 (from the past 24 hour(s))  POC Urinalysis Dipstick OB   Collection Time: 12/26/18  9:39 AM  Result Value Ref Range   Color, UA     Clarity, UA     Glucose, UA Negative Negative   Bilirubin, UA     Ketones, UA neg    Spec Grav, UA     Blood, UA 2+    pH, UA     POC,PROTEIN,UA Small (1+) Negative, Trace, Small (1+), Moderate (2+), Large (3+), 4+   Urobilinogen, UA     Nitrite, UA neg    Leukocytes, UA Small (1+) (A) Negative   Appearance     Odor      Assessment & Plan:  1) High-risk pregnancy G3P2002 at 6778w6d with an Estimated Date of Delivery: 01/31/19 Di/Di twin pregnancy with symmetric growth,  2) proteinuria affecting pregnancy in third trimester, stable  3) history of postpartum hemorrhage, stable  4) considering tubal  Meds: No orders of the defined types were placed in this encounter.   Labs/procedures today: cbc, cmet, u/a U/s BPP:8/8 for each baby    Treatment Plan: Weekly BPP x2 for twins, with ultrasound 36 weeks  Reviewed:  labor symptoms and general obstetric precautions including but not limited to vaginal bleeding, contractions, leaking of fluid and fetal movement were reviewed with  the patient.  All questions were answered.  Follow-up: No follow-ups on file.  Orders Placed This Encounter  Procedures  . POC Urinalysis Dipstick OB   Tilda Burrow MD 12/26/2018 9:46 AM

## 2018-12-26 NOTE — Progress Notes (Signed)
[place] 34+6 wks,DI/DI twins,normal ovaries  BABY A: cephalic,anterior placenta gr 3,BPP 8/8,fhr 130 bpm,svp of fluid 3.1 cm BABY B: transverse head left,anterior placenta gr 3,BPP 8/8,FHR 138 bpm,svp of fluid 5.8 cm,

## 2018-12-27 LAB — COMPREHENSIVE METABOLIC PANEL
ALT: 7 IU/L (ref 0–32)
AST: 16 IU/L (ref 0–40)
Albumin/Globulin Ratio: 1.2 (ref 1.2–2.2)
Albumin: 3.2 g/dL — ABNORMAL LOW (ref 3.9–5.0)
Alkaline Phosphatase: 123 IU/L — ABNORMAL HIGH (ref 39–117)
BUN/Creatinine Ratio: 8 — ABNORMAL LOW (ref 9–23)
BUN: 4 mg/dL — ABNORMAL LOW (ref 6–20)
Bilirubin Total: 0.3 mg/dL (ref 0.0–1.2)
CO2: 21 mmol/L (ref 20–29)
Calcium: 9.6 mg/dL (ref 8.7–10.2)
Chloride: 104 mmol/L (ref 96–106)
Creatinine, Ser: 0.52 mg/dL — ABNORMAL LOW (ref 0.57–1.00)
GFR calc Af Amer: 152 mL/min/{1.73_m2} (ref >59)
GFR calc non Af Amer: 132 mL/min/{1.73_m2} (ref >59)
Globulin, Total: 2.6 g/dL (ref 1.5–4.5)
Glucose: 82 mg/dL (ref 65–99)
Potassium: 3.9 mmol/L (ref 3.5–5.2)
Sodium: 140 mmol/L (ref 134–144)
Total Protein: 5.8 g/dL — ABNORMAL LOW (ref 6.0–8.5)

## 2018-12-27 LAB — CBC
Hematocrit: 32 % — ABNORMAL LOW (ref 34.0–46.6)
Hemoglobin: 11.8 g/dL (ref 11.1–15.9)
MCH: 31.7 pg (ref 26.6–33.0)
MCHC: 36.9 g/dL — ABNORMAL HIGH (ref 31.5–35.7)
MCV: 86 fL (ref 79–97)
Platelets: 151 10*3/uL (ref 150–450)
RBC: 3.72 x10E6/uL — ABNORMAL LOW (ref 3.77–5.28)
RDW: 12.6 % (ref 11.7–15.4)
WBC: 6.6 10*3/uL (ref 3.4–10.8)

## 2019-01-01 ENCOUNTER — Other Ambulatory Visit: Payer: Self-pay | Admitting: Obstetrics and Gynecology

## 2019-01-01 ENCOUNTER — Encounter: Payer: Self-pay | Admitting: *Deleted

## 2019-01-01 DIAGNOSIS — O30003 Twin pregnancy, unspecified number of placenta and unspecified number of amniotic sacs, third trimester: Secondary | ICD-10-CM

## 2019-01-02 ENCOUNTER — Ambulatory Visit (INDEPENDENT_AMBULATORY_CARE_PROVIDER_SITE_OTHER): Payer: Medicaid Other

## 2019-01-02 ENCOUNTER — Ambulatory Visit (INDEPENDENT_AMBULATORY_CARE_PROVIDER_SITE_OTHER): Payer: Medicaid Other | Admitting: Obstetrics & Gynecology

## 2019-01-02 ENCOUNTER — Encounter: Payer: Self-pay | Admitting: Obstetrics & Gynecology

## 2019-01-02 ENCOUNTER — Other Ambulatory Visit: Payer: Self-pay

## 2019-01-02 VITALS — BP 115/77 | HR 97 | Temp 98.2°F | Wt 231.0 lb

## 2019-01-02 DIAGNOSIS — O30042 Twin pregnancy, dichorionic/diamniotic, second trimester: Secondary | ICD-10-CM

## 2019-01-02 DIAGNOSIS — O30003 Twin pregnancy, unspecified number of placenta and unspecified number of amniotic sacs, third trimester: Secondary | ICD-10-CM

## 2019-01-02 DIAGNOSIS — Z1389 Encounter for screening for other disorder: Secondary | ICD-10-CM

## 2019-01-02 DIAGNOSIS — Z331 Pregnant state, incidental: Secondary | ICD-10-CM

## 2019-01-02 DIAGNOSIS — Z3A35 35 weeks gestation of pregnancy: Secondary | ICD-10-CM

## 2019-01-02 DIAGNOSIS — O30043 Twin pregnancy, dichorionic/diamniotic, third trimester: Secondary | ICD-10-CM

## 2019-01-02 DIAGNOSIS — O1213 Gestational proteinuria, third trimester: Secondary | ICD-10-CM

## 2019-01-02 DIAGNOSIS — O0990 Supervision of high risk pregnancy, unspecified, unspecified trimester: Secondary | ICD-10-CM

## 2019-01-02 DIAGNOSIS — O0993 Supervision of high risk pregnancy, unspecified, third trimester: Secondary | ICD-10-CM

## 2019-01-02 LAB — POCT URINALYSIS DIPSTICK OB
Glucose, UA: NEGATIVE
Ketones, UA: NEGATIVE
Nitrite, UA: NEGATIVE

## 2019-01-02 NOTE — Progress Notes (Signed)
[place] 35+6 wks,DI/DI twins,normal ovaries bilat BABY A:cephalic,fhr 126 bpm,anterior placenta gr 3,svp of fluid 4.7 cm,BPP 8/8,EFW 2930 g 67%,discordance 4.25% BABY B:transverse head left superior,anterior placenta gr 3,svp of fluid 7 cm,BPP 8/8,EFW 3060 g 78%

## 2019-01-02 NOTE — Progress Notes (Signed)
   HIGH-RISK PREGNANCY VISIT Patient name: Amanda Perez MRN [id number]  Date of birth: March 14, 1991 Chief Complaint:   High Risk Gestation ([place] today)  History of Present Illness:   Amanda Perez is a 28 y.o. G73P2002 female at [redacted]w[redacted]d with an Estimated Date of Delivery: 01/31/19 being seen today for ongoing management of a high-risk pregnancy complicated by DiDi twins.  Today she reports no complaints. Contractions: Irregular. Vag. Bleeding: None.  Movement: Present. denies leaking of fluid.  Review of Systems:   Pertinent items are noted in HPI Denies abnormal vaginal discharge w/ itching/odor/irritation, headaches, visual changes, shortness of breath, chest pain, abdominal pain, severe nausea/vomiting, or problems with urination or bowel movements unless otherwise stated above. Pertinent History Reviewed:  Reviewed past medical,surgical, social, obstetrical and family history.  Reviewed problem list, medications and allergies. Physical Assessment:   Vitals:   01/02/19 0927  BP: 115/77  Pulse: 97  Temp: 98.2 F (36.8 C)  Weight: 231 lb (104.8 kg)  Body mass index is 40.92 kg/m.           Physical Examination:   General appearance: alert, well appearing, and in no distress  Mental status: alert, oriented to person, place, and time  Skin: warm & dry   Extremities: Edema: Moderate pitting, indentation subsides rapidly    Cardiovascular: normal heart rate noted  Respiratory: normal respiratory effort, no distress  Abdomen: gravid, soft, non-tender  Pelvic: Cervical exam deferred         Fetal Status:     Movement: Present    Fetal Surveillance Testing today: BPP 8/8 x2 with excellent growth parameters for both twins, see report   Results for orders placed or performed in visit on 01/02/19 (from the past 24 hour(s))  POC Urinalysis Dipstick OB   Collection Time: 01/02/19  9:28 AM  Result Value Ref Range   Color, UA     Clarity, UA     Glucose, UA Negative Negative   Bilirubin,  UA     Ketones, UA neg    Spec Grav, UA     Blood, UA 3+    pH, UA     POC,PROTEIN,UA Moderate (2+) Negative, Trace, Small (1+), Moderate (2+), Large (3+), 4+   Urobilinogen, UA     Nitrite, UA neg    Leukocytes, UA Moderate (2+) (A) Negative   Appearance     Odor      Assessment & Plan:  1) High-risk pregnancy G3P2002 at [redacted]w[redacted]d with an Estimated Date of Delivery: 01/31/19   2) DiDi twins, stable, reassuring fetal surveillance, Vertex, transverse presentation, IOL 38 weeks if remains undelivered  3) Proteinuria, stable, but BP remains good, continue to monitor, no indication for change at this point, recheck Pr/Cr today  Meds: No orders of the defined types were placed in this encounter.   Labs/procedures today: sonogram  Treatment Plan:  Continue weekly BPPs, IOL 38 weeks or as clinically indicated, proteinuria without any BP elevation, continue to monitor  Reviewed: Term labor symptoms and general obstetric precautions including but not limited to vaginal bleeding, contractions, leaking of fluid and fetal movement were reviewed in detail with the patient.  All questions were answered.  Follow-up: Return in about 1 week (around 01/09/2019) for BPP x 2, , HROB.  Orders Placed This Encounter  Procedures  . POC Urinalysis Dipstick OB   Lazaro Arms  01/02/2019 9:56 AM

## 2019-01-08 ENCOUNTER — Other Ambulatory Visit: Payer: Self-pay | Admitting: Obstetrics & Gynecology

## 2019-01-08 DIAGNOSIS — O30043 Twin pregnancy, dichorionic/diamniotic, third trimester: Secondary | ICD-10-CM

## 2019-01-09 ENCOUNTER — Encounter (HOSPITAL_COMMUNITY): Payer: Self-pay | Admitting: *Deleted

## 2019-01-09 ENCOUNTER — Encounter (HOSPITAL_COMMUNITY): Payer: Self-pay

## 2019-01-09 ENCOUNTER — Inpatient Hospital Stay (HOSPITAL_COMMUNITY)
Admission: AD | Admit: 2019-01-09 | Discharge: 2019-01-11 | DRG: 806 | Disposition: A | Discharge status: Home / Self Care | Payer: Medicaid Other | Attending: Obstetrics & Gynecology | Admitting: Obstetrics & Gynecology

## 2019-01-09 ENCOUNTER — Encounter: Payer: Self-pay | Admitting: Women's Health

## 2019-01-09 ENCOUNTER — Ambulatory Visit (INDEPENDENT_AMBULATORY_CARE_PROVIDER_SITE_OTHER): Payer: Medicaid Other | Admitting: Women's Health

## 2019-01-09 ENCOUNTER — Telehealth (HOSPITAL_COMMUNITY): Payer: Self-pay | Admitting: *Deleted

## 2019-01-09 ENCOUNTER — Ambulatory Visit (INDEPENDENT_AMBULATORY_CARE_PROVIDER_SITE_OTHER): Payer: Medicaid Other

## 2019-01-09 ENCOUNTER — Inpatient Hospital Stay (HOSPITAL_COMMUNITY): Payer: Medicaid Other | Admitting: Anesthesiology

## 2019-01-09 ENCOUNTER — Other Ambulatory Visit: Payer: Self-pay

## 2019-01-09 VITALS — BP 118/72 | HR 86 | Wt 233.6 lb

## 2019-01-09 DIAGNOSIS — B009 Herpesviral infection, unspecified: Secondary | ICD-10-CM | POA: Diagnosis not present

## 2019-01-09 DIAGNOSIS — O9852 Other viral diseases complicating childbirth: Secondary | ICD-10-CM | POA: Diagnosis not present

## 2019-01-09 DIAGNOSIS — O0990 Supervision of high risk pregnancy, unspecified, unspecified trimester: Secondary | ICD-10-CM

## 2019-01-09 DIAGNOSIS — O30049 Twin pregnancy, dichorionic/diamniotic, unspecified trimester: Secondary | ICD-10-CM | POA: Diagnosis not present

## 2019-01-09 DIAGNOSIS — Z3A36 36 weeks gestation of pregnancy: Secondary | ICD-10-CM

## 2019-01-09 DIAGNOSIS — O30043 Twin pregnancy, dichorionic/diamniotic, third trimester: Secondary | ICD-10-CM

## 2019-01-09 DIAGNOSIS — O09299 Supervision of pregnancy with other poor reproductive or obstetric history, unspecified trimester: Secondary | ICD-10-CM

## 2019-01-09 DIAGNOSIS — A6000 Herpesviral infection of urogenital system, unspecified: Secondary | ICD-10-CM | POA: Diagnosis present

## 2019-01-09 DIAGNOSIS — O99824 Streptococcus B carrier state complicating childbirth: Secondary | ICD-10-CM | POA: Diagnosis present

## 2019-01-09 DIAGNOSIS — O9832 Other infections with a predominantly sexual mode of transmission complicating childbirth: Secondary | ICD-10-CM | POA: Diagnosis not present

## 2019-01-09 DIAGNOSIS — O1214 Gestational proteinuria, complicating childbirth: Secondary | ICD-10-CM | POA: Diagnosis present

## 2019-01-09 DIAGNOSIS — R8271 Bacteriuria: Secondary | ICD-10-CM | POA: Diagnosis present

## 2019-01-09 DIAGNOSIS — O99214 Obesity complicating childbirth: Secondary | ICD-10-CM | POA: Diagnosis present

## 2019-01-09 DIAGNOSIS — Z331 Pregnant state, incidental: Secondary | ICD-10-CM

## 2019-01-09 DIAGNOSIS — Z3A00 Weeks of gestation of pregnancy not specified: Secondary | ICD-10-CM | POA: Diagnosis not present

## 2019-01-09 DIAGNOSIS — O1213 Gestational proteinuria, third trimester: Secondary | ICD-10-CM | POA: Diagnosis not present

## 2019-01-09 DIAGNOSIS — O0993 Supervision of high risk pregnancy, unspecified, third trimester: Secondary | ICD-10-CM | POA: Diagnosis not present

## 2019-01-09 DIAGNOSIS — O98313 Other infections with a predominantly sexual mode of transmission complicating pregnancy, third trimester: Secondary | ICD-10-CM

## 2019-01-09 DIAGNOSIS — Z1389 Encounter for screening for other disorder: Secondary | ICD-10-CM

## 2019-01-09 LAB — TYPE AND SCREEN
ABO/RH(D): A POS
Antibody Screen: NEGATIVE

## 2019-01-09 LAB — POCT URINALYSIS DIPSTICK OB
Glucose, UA: NEGATIVE
Ketones, UA: NEGATIVE
Leukocytes, UA: NEGATIVE
Nitrite, UA: NEGATIVE

## 2019-01-09 LAB — CBC
HCT: 35.3 % — ABNORMAL LOW (ref 36.0–46.0)
Hemoglobin: 12.3 g/dL (ref 12.0–15.0)
MCH: 31.3 pg (ref 26.0–34.0)
MCHC: 34.8 g/dL (ref 30.0–36.0)
MCV: 89.8 fL (ref 80.0–100.0)
Platelets: 146 10*3/uL — ABNORMAL LOW (ref 150–400)
RBC: 3.93 MIL/uL (ref 3.87–5.11)
RDW: 13.5 % (ref 11.5–15.5)
WBC: 8.9 10*3/uL (ref 4.0–10.5)
nRBC: 0 % (ref 0.0–0.2)

## 2019-01-09 MED ORDER — FENTANYL-BUPIVACAINE-NACL 0.5-0.125-0.9 MG/250ML-% EP SOLN
EPIDURAL | Status: AC
  Filled 2019-01-09: qty 250

## 2019-01-09 MED ORDER — MISOPROSTOL 200 MCG PO TABS
800.0000 ug | ORAL_TABLET | Freq: Once | ORAL | Status: AC
  Administered 2019-01-09: 800 ug via RECTAL
  Filled 2019-01-09: qty 4

## 2019-01-09 MED ORDER — EPHEDRINE 5 MG/ML INJ: 10.0000 mg | INTRAVENOUS | Status: DC | PRN

## 2019-01-09 MED ORDER — DIPHENOXYLATE-ATROPINE 2.5-0.025 MG PO TABS
2.0000 | ORAL_TABLET | Freq: Once | ORAL | Status: AC
  Administered 2019-01-10: 2 via ORAL
  Filled 2019-01-09: qty 2

## 2019-01-09 MED ORDER — LACTATED RINGERS IV SOLN
INTRAVENOUS | Status: DC
  Administered 2019-01-09 (×2): via INTRAVENOUS

## 2019-01-09 MED ORDER — ACYCLOVIR 400 MG PO TABS: 400.0000 mg | ORAL_TABLET | Freq: Three times a day (TID) | ORAL | 3 refills | Status: DC

## 2019-01-09 MED ORDER — SODIUM CHLORIDE 0.9 % IV SOLN
5.0000 10*6.[IU] | Freq: Once | INTRAVENOUS | Status: AC
  Administered 2019-01-09: 5 10*6.[IU] via INTRAVENOUS
  Filled 2019-01-09: qty 5

## 2019-01-09 MED ORDER — OXYTOCIN BOLUS FROM INFUSION
500.0000 mL | Freq: Once | INTRAVENOUS | Status: AC
  Administered 2019-01-09: 500 mL/h via INTRAVENOUS

## 2019-01-09 MED ORDER — SODIUM CHLORIDE (PF) 0.9 % IJ SOLN
INTRAMUSCULAR | Status: DC | PRN
  Administered 2019-01-09: 14 mL/h via EPIDURAL

## 2019-01-09 MED ORDER — FLEET ENEMA 7-19 GM/118ML RE ENEM: 1.0000 | ENEMA | RECTAL | Status: DC | PRN

## 2019-01-09 MED ORDER — PHENYLEPHRINE 40 MCG/ML (10ML) SYRINGE FOR IV PUSH (FOR BLOOD PRESSURE SUPPORT): 80.0000 ug | PREFILLED_SYRINGE | INTRAVENOUS | Status: DC | PRN

## 2019-01-09 MED ORDER — METHYLERGONOVINE MALEATE 0.2 MG/ML IJ SOLN
0.2000 mg | Freq: Once | INTRAMUSCULAR | Status: AC
  Administered 2019-01-09: 0.2 mg via INTRAMUSCULAR
  Filled 2019-01-09: qty 1

## 2019-01-09 MED ORDER — ACETAMINOPHEN 325 MG PO TABS: 650.0000 mg | ORAL_TABLET | ORAL | Status: DC | PRN

## 2019-01-09 MED ORDER — ONDANSETRON HCL 4 MG/2ML IJ SOLN
4.0000 mg | Freq: Four times a day (QID) | INTRAMUSCULAR | Status: DC | PRN
  Administered 2019-01-09: 4 mg via INTRAVENOUS
  Filled 2019-01-09: qty 2

## 2019-01-09 MED ORDER — LACTATED RINGERS IV SOLN: 500.0000 mL | INTRAVENOUS | Status: DC | PRN

## 2019-01-09 MED ORDER — FENTANYL-BUPIVACAINE-NACL 0.5-0.125-0.9 MG/250ML-% EP SOLN: 12.0000 mL/h | EPIDURAL | Status: DC | PRN

## 2019-01-09 MED ORDER — LACTATED RINGERS IV SOLN: 500.0000 mL | Freq: Once | INTRAVENOUS | Status: DC

## 2019-01-09 MED ORDER — FENTANYL CITRATE (PF) 100 MCG/2ML IJ SOLN
50.0000 ug | INTRAMUSCULAR | Status: DC | PRN
  Administered 2019-01-09 (×2): 50 ug via INTRAVENOUS
  Administered 2019-01-09: 100 ug via INTRAVENOUS
  Filled 2019-01-09 (×3): qty 2

## 2019-01-09 MED ORDER — HYDROXYZINE HCL 50 MG PO TABS: 50.0000 mg | ORAL_TABLET | Freq: Four times a day (QID) | ORAL | Status: DC | PRN

## 2019-01-09 MED ORDER — CARBOPROST TROMETHAMINE 250 MCG/ML IM SOLN
250.0000 ug | Freq: Once | INTRAMUSCULAR | Status: AC
  Administered 2019-01-09: 250 ug via INTRAMUSCULAR

## 2019-01-09 MED ORDER — PENICILLIN G 3 MILLION UNITS IVPB - SIMPLE MED
3.0000 10*6.[IU] | INTRAVENOUS | Status: DC
  Administered 2019-01-09: 3 10*6.[IU] via INTRAVENOUS
  Filled 2019-01-09: qty 100

## 2019-01-09 MED ORDER — OXYTOCIN 40 UNITS IN NORMAL SALINE INFUSION - SIMPLE MED
2.5000 [IU]/h | INTRAVENOUS | Status: DC
  Administered 2019-01-09: 2.5 [IU]/h via INTRAVENOUS
  Filled 2019-01-09: qty 1000

## 2019-01-09 MED ORDER — SOD CITRATE-CITRIC ACID 500-334 MG/5ML PO SOLN: 30.0000 mL | ORAL | Status: DC | PRN

## 2019-01-09 MED ORDER — METHYLERGONOVINE MALEATE 0.2 MG/ML IJ SOLN
INTRAMUSCULAR | Status: AC
  Filled 2019-01-09: qty 1

## 2019-01-09 MED ORDER — DIPHENHYDRAMINE HCL 50 MG/ML IJ SOLN: 12.5000 mg | INTRAMUSCULAR | Status: DC | PRN

## 2019-01-09 MED ORDER — OXYCODONE-ACETAMINOPHEN 5-325 MG PO TABS: 1.0000 | ORAL_TABLET | ORAL | Status: DC | PRN

## 2019-01-09 MED ORDER — LIDOCAINE HCL (PF) 1 % IJ SOLN
INTRAMUSCULAR | Status: DC | PRN
  Administered 2019-01-09: 4 mL via EPIDURAL
  Administered 2019-01-09: 5 mL via EPIDURAL

## 2019-01-09 MED ORDER — CARBOPROST TROMETHAMINE 250 MCG/ML IM SOLN
INTRAMUSCULAR | Status: AC
  Administered 2019-01-09: 250 ug via INTRAMUSCULAR
  Filled 2019-01-09: qty 1

## 2019-01-09 MED ORDER — LIDOCAINE HCL (PF) 1 % IJ SOLN: 30.0000 mL | INTRAMUSCULAR | Status: DC | PRN

## 2019-01-09 MED ORDER — OXYCODONE-ACETAMINOPHEN 5-325 MG PO TABS: 2.0000 | ORAL_TABLET | ORAL | Status: DC | PRN

## 2019-01-09 MED ORDER — TRANEXAMIC ACID-NACL 1000-0.7 MG/100ML-% IV SOLN
1000.0000 mg | INTRAVENOUS | Status: AC
  Administered 2019-01-09: 1000 mg via INTRAVENOUS
  Filled 2019-01-09: qty 100

## 2019-01-09 NOTE — Patient Instructions (Signed)
Your induction is scheduled for Friday 5/8 @ 7:30am. [facility] and let them know you are there to be induced. They will send someone from Labor & Delivery to come get you.    Sharion SettlerBriana Elsen, I greatly value your feedback.  If you receive a survey following your visit with us today, we appreciate you taking the time to fill it out.  Thanks, Joellyn HaffKim Taitum Menton, CNM, [facility]  [facility] M[facility]!!! It is now [facility] ([address] [place], [place] [address]) Entrance located off of [address] Free 24/7 valet parking    Call the office [phone]) or go to [facility] if:  You begin to have strong, frequent contractions  Your water breaks.  Sometimes it is a big gush of fluid, sometimes it is just a trickle that keeps getting your panties wet or running down your legs  You have vaginal bleeding.  It is normal to have a small amount of spotting if your cervix was checked.   You don't feel your baby moving like normal.  If you don't, get you something to eat and drink and lay down and focus on feeling your baby move.  You should feel at least 10 movements in 2 hours.  If you don't, you should call the office or go to [facility].   Call the office [phone]) or go to [facility] for these signs of pre-eclampsia:  Severe headache that does not go away with Tylenol  Visual changes- seeing spots, double, blurred vision  Pain under your right breast or upper abdomen that does not go away with Tums or heartburn medicine  Nausea and/or vomiting  Severe swelling in your hands, feet, and face     Braxton Hicks Contractions Contractions of the uterus can occur throughout pregnancy, but they are not always a sign that you are in labor. You may have practice contractions called Braxton Hicks contractions. These false labor contractions are sometimes confused with true labor. What are Deberah PeltonBraxton Hicks contractions? Braxton  Hicks contractions are tightening movements that occur in the muscles of the uterus before labor. Unlike true labor contractions, these contractions do not result in opening (dilation) and thinning of the cervix. Toward the end of pregnancy (32-34 weeks), Braxton Hicks contractions can happen more often and may become stronger. These contractions are sometimes difficult to tell apart from true labor because they can be very uncomfortable. You should not feel embarrassed if you go to the hospital with false labor. Sometimes, the only way to tell if you are in true labor is for your health care provider to look for changes in the cervix. The health care provider will do a physical exam and may monitor your contractions. If you are not in true labor, the exam should show that your cervix is not dilating and your water has not broken. If there are no other health problems associated with your pregnancy, it is completely safe for you to be sent home with false labor. You may continue to have Braxton Hicks contractions until you go into true labor. How to tell the difference between true labor and false labor True labor  Contractions last 30-70 seconds.  Contractions become very regular.  Discomfort is usually felt in the top of the uterus, and it spreads to the lower abdomen and low back.  Contractions do not go away with walking.  Contractions usually become more intense and increase in frequency.  The cervix dilates and gets thinner. False labor  Contractions are usually shorter and not as strong as true labor contractions.  Contractions are usually irregular.  Contractions are often felt in the front of the lower abdomen and in the groin.  Contractions may go away when you walk around or change positions while lying down.  Contractions get weaker and are shorter-lasting as time goes on.  The cervix usually does not dilate or become thin. Follow these instructions at home:   Take  over-the-counter and prescription medicines only as told by your health care provider.  Keep up with your usual exercises and follow other instructions from your health care provider.  Eat and drink lightly if you think you are going into labor.  If Braxton Hicks contractions are making you uncomfortable: ? Change your position from lying down or resting to walking, or change from walking to resting. ? Sit and rest in a tub of warm water. ? Drink enough fluid to keep your urine pale yellow. Dehydration may cause these contractions. ? Do slow and deep breathing several times an hour.  Keep all follow-up prenatal visits as told by your health care provider. This is important. Contact a health care provider if:  You have a fever.  You have continuous pain in your abdomen. Get help right away if:  Your contractions become stronger, more regular, and closer together.  You have fluid leaking or gushing from your vagina.  You pass blood-tinged mucus (bloody show).  You have bleeding from your vagina.  You have low back pain that you never had before.  You feel your baby's head pushing down and causing pelvic pressure.  Your baby is not moving inside you as much as it used to. Summary  Contractions that occur before labor are called Braxton Hicks contractions, false labor, or practice contractions.  Braxton Hicks contractions are usually shorter, weaker, farther apart, and less regular than true labor contractions. True labor contractions usually become progressively stronger and regular, and they become more frequent.  Manage discomfort from [facility] contractions by changing position, resting in a warm bath, drinking plenty of water, or practicing deep breathing. This information is not intended to replace advice given to you by your health care provider. Make sure you discuss any questions you have with your health care provider. Document Released: 01/11/2017 Document Revised:  06/12/2017 Document Reviewed: 01/11/2017 Elsevier Interactive Patient Education  2019 [employer].  Coronavirus (COVID-19) Are you at risk?  Are you at risk for the Coronavirus (COVID-19)?  To be considered HIGH RISK for Coronavirus (COVID-19), you have to meet the following criteria:  . Traveled to [place], [place], [place], [place] or [place]; or in the [place] to [place], [place], [place], or [place]; and have fever, cough, and shortness of breath within the last 2 weeks of travel OR . Been in close contact with a person diagnosed with COVID-19 within the last 2 weeks and have fever, cough, and shortness of breath . IF YOU DO NOT MEET THESE CRITERIA, YOU ARE CONSIDERED LOW RISK FOR COVID-19.  What to do if you are HIGH RISK for COVID-19?  Marland Kitchen If you are having a medical emergency, call 911. . Seek medical care right away. Before you go to a doctor's office, urgent care or emergency department, call ahead and tell them about your recent travel, contact with someone diagnosed with COVID-19, and your symptoms. You should receive instructions from your physician's office regarding next steps of  care.  . When you arrive at healthcare provider, tell the healthcare staff immediately you have returned from visiting [place], [place], [place], [place] or [place]; or traveled in the [place] to [place], [place], [place], or [place]; in the last two weeks or you have been in close contact with a person diagnosed with COVID-19 in the last 2 weeks.   . Tell the health care staff about your symptoms: fever, cough and shortness of breath. . After you have been seen by a medical provider, you will be either: o Tested for (COVID-19) and discharged home on quarantine except to seek medical care if symptoms worsen, and asked to  - Stay home and avoid contact with others until you get your results (4-5 days)  - Avoid travel on public transportation if possible (such as bus, train, or  airplane) or o Sent to the Emergency Department by EMS for evaluation, COVID-19 testing, and possible admission depending on your condition and test results.  What to do if you are LOW RISK for COVID-19?  Reduce your risk of any infection by using the same precautions used for avoiding the common cold or flu:  Marland Kitchen Wash your hands often with soap and warm water for at least 20 seconds.  If soap and water are not readily available, use an alcohol-based hand sanitizer with at least 60% alcohol.  . If coughing or sneezing, cover your mouth and nose by coughing or sneezing into the elbow areas of your shirt or coat, into a tissue or into your sleeve (not your hands). . Avoid shaking hands with others and consider head nods or verbal greetings only. . Avoid touching your eyes, nose, or mouth with unwashed hands.  . Avoid close contact with people who are sick. . Avoid places or events with large numbers of people in one location, like concerts or sporting events. . Carefully consider travel plans you have or are making. . If you are planning any travel outside or inside the [place], visit the CDC's Travelers' Health webpage for the latest health notices. . If you have some symptoms but not all symptoms, continue to monitor at home and seek medical attention if your symptoms worsen. . If you are having a medical emergency, call 911.   ADDITIONAL HEALTHCARE OPTIONS FOR PATIENTS  [facility] / e-Visit: [email or web address]         [facility]: [phone]  [facility]: [phone]                   [facility]: [phone]

## 2019-01-09 NOTE — Progress Notes (Signed)
HIGH-RISK PREGNANCY VISIT Patient name: Amanda Perez MRN [id number]  Date of birth: 12-17-1990 Chief Complaint:   High Risk Gestation ([place] today, cultures)  History of Present Illness:   Amanda Perez is a 28 y.o. G15P2002 female at [redacted]w[redacted]d with an Estimated Date of Delivery: 01/31/19 being seen today for ongoing management of a high-risk pregnancy complicated by Di-Di twin gestation, proteinuria.  Today she reports no complaints. Had regular contractions the other night, then they went away. Contractions: Irregular. Vag. Bleeding: None.  Movement: Present. denies leaking of fluid.  Review of Systems:   Pertinent items are noted in HPI Denies abnormal vaginal discharge w/ itching/odor/irritation, headaches, visual changes, shortness of breath, chest pain, abdominal pain, severe nausea/vomiting, or problems with urination or bowel movements unless otherwise stated above. Pertinent History Reviewed:  Reviewed past medical,surgical, social, obstetrical and family history.  Reviewed problem list, medications and allergies. Physical Assessment:   Vitals:   01/09/19 0916  BP: 118/72  Pulse: 86  Weight: 233 lb 9.6 oz (106 kg)  Body mass index is 41.38 kg/m.           Physical Examination:   General appearance: alert, well appearing, and in no distress  Mental status: alert, oriented to person, place, and time  Skin: warm & dry   Extremities: Edema: Moderate pitting, indentation subsides rapidly    Cardiovascular: normal heart rate noted  Respiratory: normal respiratory effort, no distress  Abdomen: gravid, soft, non-tender  Pelvic: Cervical exam performed  Dilation: 6 Effacement (%): 50 Station: -2  Fetal Status: Fetal Heart Rate (bpm): 138/152 u/s Fundal Height: 46 cm Movement: Present Presentation: Vertex  Fetal Surveillance Testing today: bpp  Today's [place] DI/DI twins: BABY A:cephalic right,anterior placenta gr 3,BPP 8/8,FHR 138 bpm,SVP of fluid 2.8 cm BABY B:cephalic left,anterior  placenta gr 3,BPP 8/8,FHR 152 BPM,SVP of fluid 6.4 cm  Results for orders placed or performed in visit on 01/09/19 (from the past 24 hour(s))  POC Urinalysis Dipstick OB   Collection Time: 01/09/19  9:18 AM  Result Value Ref Range   Color, UA     Clarity, UA     Glucose, UA Negative Negative   Bilirubin, UA     Ketones, UA neg    Spec Grav, UA     Blood, UA large    pH, UA     POC,PROTEIN,UA Large (3+) Negative, Trace, Small (1+), Moderate (2+), Large (3+), 4+   Urobilinogen, UA     Nitrite, UA neg    Leukocytes, UA Negative Negative   Appearance     Odor       Assessment & Plan:  1) High-risk pregnancy G3P2002 at [redacted]w[redacted]d with an Estimated Date of Delivery: 01/31/19   2) Di-Di Twins, stable, vtx/vtx today, bpp 8/8 for both, IOL scheduled for 5/8 @ 0730,  IOL form faxed via Epic and orders placed   3) Proteinuria, ongoing, bp's normal, asymptomatic, labs have been normal, urine cx ago normal. Will recheck labs, urine cx today. 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 acyclovir (ZOVIRAX) 400 MG tablet    Sig: Take 1 tablet (400 mg total) by mouth 3 (three) times daily.    Dispense:  90 tablet    Refill:  3    Order Specific Question:   Supervising Provider    Answer:   Lazaro Arms [  [id number]]    Labs/procedures today: bpp, gc/ct, sve (GBS not done, prior + in urine this pregnancy)  Treatment Plan:  IOL 5/8 @ 0730, currently 6cm but not in labor- [facility] first sign of labor/ROM  Reviewed: Term labor symptoms and general obstetric precautions including but not limited to vaginal bleeding, contractions, leaking of fluid and fetal movement were reviewed in detail with the patient.  All questions were answered.  Follow-up: Return for cancel all appts, will call for ppv.  Orders Placed This Encounter  Procedures  . GC/Chlamydia Probe Amp  . Urine  Culture  . CBC  . Comprehensive metabolic panel  . POC Urinalysis Dipstick OB   Cheral MarkerKimberly R Emannuel Vise CNM, [facility] 01/09/2019 10:15 AM

## 2019-01-09 NOTE — Progress Notes (Signed)
Vitals:   01/09/19 1925 01/09/19 2005  BP: 121/70 117/68  Pulse: 94 85  Resp: 16   Temp: 98.4 F (36.9 C)    AROM at 2015.  Ctx rapidly became stronger and pt wants epidural. Cx 9/90/0.  FHR Cat 1 x2.  Pt to get epidural

## 2019-01-09 NOTE — H&P (Addendum)
Amanda SettlerBriana Gienger is a 28 y.o. female G3P2002 with IUP at 6018w6d presenting for labor evaluation. Pt states she has been having regular, every 3-4 minutes contractions, associated with none vaginal bleeding for several hours. She was seen at the office this am w/irregular, infrequent ctx and noted to be 6/50/-2.  Membranes are intact, with active fetal movement.   [facility]  Prenatal History/Complications:  Persistent proteinuria since about 29 weeks, no s/s of Preeclampsia Di/Di twins, vtx/vtx on today's US SVD X2 Hx PPH w /Bakri/D&C/blood tx  Past Medical History: Past Medical History:  Diagnosis Date  . HSV-2 (herpes simplex virus 2) infection   . Other disorder of menstruation and other abnormal bleeding from female genital tract 07/10/2013   Bleeding daily since IUD inserted  . Pregnant 12/29/2014  . Pregnant     Past Surgical History: Past Surgical History:  Procedure Laterality Date  . DILATION AND EVACUATION N/A 08/15/2015   Procedure: DILATATION and curretage, repair of laceration;  Surgeon: Tereso NewcomerUgonna A Anyanwu, MD;  Location: [facility];  Service: Gynecology;  Laterality: N/A;    Obstetrical History: OB History    Gravida  3   Para  2   Term  2   Preterm      AB      Living  2     SAB      TAB      Ectopic      Multiple  0   Live Births  2            Social History: Social History   Socioeconomic History  . Marital status: Single    Spouse name: Not on file  . Number of children: 2  . Years of education: 12+  . Highest education level: Associate degree: occupational, Scientist, product/process developmenttechnical, or vocational program  Occupational History  . Not on file  Social Needs  . Financial resource strain: Not hard at all  . Food insecurity:    Worry: Never true    Inability: Never true  . Transportation needs:    Medical: No    Non-medical: No  Tobacco Use  . Smoking status: Never Smoker  . Smokeless tobacco: Never Used  Substance and Sexual Activity  .  Alcohol use: Not Currently    Frequency: Never  . Drug use: No  . Sexual activity: Yes    Birth control/protection: None  Lifestyle  . Physical activity:    Days per week: 5 days    Minutes per session: 10 min  . Stress: Not at all  Relationships  . Social connections:    Talks on phone: More than three times a week    Gets together: More than three times a week    Attends religious service: 1 to 4 times per year    Active member of club or organization: No    Attends meetings of clubs or organizations: Never    Relationship status: Living with partner  Other Topics Concern  . Not on file  Social History Narrative  . Not on file    Family History: Family History  Problem Relation Age of Onset  . Hypertension Mother   . Seizures Mother   . Hypertension Paternal Grandmother   . Hypertension Maternal Grandmother     Allergies: No Known Allergies  Facility-Administered Medications Prior to Admission  Medication Dose Route Frequency Provider Last Rate Last Dose  . miconazole (MONISTAT 7) 2 % vaginal cream 1 Applicatorful  1 Applicatorful Vaginal QHS  Tilda Burrow, MD       Medications Prior to Admission  Medication Sig Dispense Refill Last Dose  . acyclovir (ZOVIRAX) 400 MG tablet Take 1 tablet (400 mg total) by mouth 3 (three) times daily. 90 tablet 3 01/09/2019 at Unknown time  . Doxylamine-Pyridoxine (DICLEGIS) 10-10 MG TBEC 2 tabs q hs, if sx persist add 1 tab q am on day 3, if sx persist add 1 tab q afternoon on day 4 100 tablet 6 01/08/2019 at Unknown time  . Prenat w/o A-FeCbGl-DSS-FA-DHA (CITRANATAL ASSURE) 35-1 & 300 MG tablet One tablet and one capsule daily 60 tablet 11 01/08/2019 at Unknown time        Review of Systems   Constitutional: Negative for fever and chills Eyes: Negative for visual disturbances Respiratory: Negative for shortness of breath, dyspnea Cardiovascular: Negative for chest pain or palpitations  Gastrointestinal: Negative for  vomiting, diarrhea and constipation.  POSITIVE for abdominal pain (contractions) Genitourinary: Negative for dysuria and urgency Musculoskeletal: Negative for back pain, joint pain, myalgias  Neurological: Negative for dizziness and headaches      Blood pressure 125/76, pulse 80, temperature 98.3 F (36.8 C), temperature source Oral, resp. rate 18, weight 105.7 kg, last menstrual period 04/26/2018. General appearance: alert, cooperative and no distress Lungs: clear to auscultation bilaterally Heart: regular rate and rhythm Abdomen: soft, non-tender; bowel sounds normal Extremities: Homans sign is negative, no sign of DVT DTR's 2+ Presentation: cephalic Fetal monitoring  Baseline: 145x2 bpm, Variability: Good {> 6 bpm), Accelerations: Reactive and Decelerations: Absent Uterine activity  q 2-4 minutes Dilation: 7 Effacement (%): 70 Station: -2 Exam by:: Drenda Freeze, CNM    [facility]  [facility] RESULTS  Language English Pap 07/25/17 neg  Initiated care at [facility] GC/CT Initial:  -/-          36wks:  Dating by LMP c/w 7wk U/S    Support person [employer] NT/IT:  neg      AFP:         cfDNA:    [place]/HgbE 11/13/12 neg  Flu vaccine 06/28/18 CF 02/20/13 neg  TDaP vaccine  SMA   Rhogam       Blood Type A/Positive/-- (10/23 0926)  Anatomy US Normal female x 2 'Neveah & Nova' Antibody Negative (10/23 0926)  Feeding Plan breast HBsAg Negative (10/23 0926)  Contraception BTL RPR Non Reactive (10/23 0926)  Circumcision n/a Rubella  3.22 (10/23 0926)  Pediatrician [facility] HIV Non Reactive (10/23 1610)  Prenatal Classes Info given      GTT/A1C Early:      26-28wks:69/112/79  BTL Consent  GBS     In urine  VBAC Consent n/a    Waterbirth Class Consent CNM visit PP Needs       Prenatal labs: ABO, Rh: --/--/PENDING (04/30 1515) Antibody: PENDING (04/30 1515) Rubella: 3.22 (10/23 0926) RPR: Non Reactive (02/28 0908)  HBsAg: Negative (10/23 0926)  HIV: Non Reactive (02/28 0908)    Prenatal Transfer Tool  Maternal Diabetes: No Genetic Screening: Normal Maternal Ultrasounds/Referrals: Normal Fetal Ultrasounds or other Referrals:  None Maternal Substance Abuse:  No Significant Maternal Medications:  None Significant Maternal Lab Results: Lab values include: Group B Strep positive     Results for orders placed or performed during the hospital encounter of 01/09/19 (from the past 24 hour(s))  CBC   Collection Time: 01/09/19  3:00 PM  Result Value Ref Range   WBC 8.9 4.0 - 10.5 K/uL  RBC 3.93 3.87 - 5.11 MIL/uL   Hemoglobin 12.3 12.0 - 15.0 g/dL   HCT 32.7 (L) 61.4 - 70.9 %   MCV 89.8 80.0 - 100.0 fL   MCH 31.3 26.0 - 34.0 pg   MCHC 34.8 30.0 - 36.0 g/dL   RDW 29.5 74.7 - 34.0 %   Platelets 146 (L) 150 - 400 K/uL   nRBC 0.0 0.0 - 0.2 %  Type and screen [facility]   Collection Time: 01/09/19  3:15 PM  Result Value Ref Range   ABO/RH(D) PENDING    Antibody Screen PENDING    Sample Expiration      01/12/2019 Performed at [facility], [address], [place], [place] [address]   Results for orders placed or performed in visit on 01/09/19 (from the past 24 hour(s))  POC Urinalysis Dipstick OB   Collection Time: 01/09/19  9:18 AM  Result Value Ref Range   Color, UA     Clarity, UA     Glucose, UA Negative Negative   Bilirubin, UA     Ketones, UA neg    Spec Grav, UA     Blood, UA large    pH, UA     POC,PROTEIN,UA Large (3+) Negative, Trace, Small (1+), Moderate (2+), Large (3+), 4+   Urobilinogen, UA     Nitrite, UA neg    Leukocytes, UA Negative Negative   Appearance     Odor      Assessment: Kaidy Whipple is a 28 y.o. K3C3818 with an IUP at [redacted]w[redacted]d presenting for labor  Plan: #Labor: expectant management #Hx severe PPH:  TXA, methergine, and cytotec at delivery. #Pain:  Per request #FWB Cat 1 #ID: GBS: PCN  #MOF:  breast #MOC: BTL   Jacklyn Shell 01/09/2019, 3:58 PM

## 2019-01-09 NOTE — Discharge Summary (Signed)
Postpartum Discharge Summary     Patient Name: Amanda Perez DOB: November 13, 1990 MRN: [id number]  Date of admission: 01/09/2019 Delivering Provider:    Keerica, Porter [[id number]]  Sefora, Mada [place] [[id number]]  Jacklyn Shell   Date of discharge: 01/11/2019  Admitting diagnosis: CTX Intrauterine pregnancy: [redacted]w[redacted]d     Secondary diagnosis:  Active Problems:   HSV-2 (herpes simplex virus 2) infection   Twin gestation, dichorionic diamniotic   History of postpartum hemorrhage, currently pregnant   GBS bacteriuria   Proteinuria affecting pregnancy in third trimester  Additional problems: none     Discharge diagnosis: Preterm Pregnancy Delivered- twins                                                                                               Post partum procedures:none  Augmentation: AROM at 9cms  Complications: None  Hospital course:  Onset of Labor With Vaginal Delivery     28 y.o. yo [id number] at [redacted]w[redacted]d was admitted in Active Labor on 01/09/2019. Patient had an uncomplicated labor course as follows:  Membrane Rupture Time/Date:    Mileigh, Galat [[id number]]  8:21 PM   Bilen, Gole [[id number]]  10:37 PM ,   Samora, Haux [[id number]]  01/09/2019   Maelyn, Salser [[id number]]  01/09/2019   Intrapartum Procedures:   Nandita, Lightle [[id number]]  None [1]   Tesa, Magistro [[id number]]  None [1]                                         Lacerations:  none   Meilany, Nippert [[id number]]  None [1]   Rayshonda, [place] [[id number]]  None [1]  Patient had a delivery of 2 Viable infants   Shannara, Night [[id number]]  01/09/2019   Yasheka, Halleran [[id number]]  01/09/2019 Information for the patient's newborn:  Starletta, Morua [[id number]]  Delivery Method: Vaginal, Spontaneous(Filed from Delivery Summary) Information for the patient's newborn:  Kimba, Renfrew [[id number]]     She received Lysteda 1000mg  IV when Baby A was delivered.  After the placentas delivered, pt given 800 mcg of cytotec PR and 0.2mg  methergine IM prophylacticly. Received hemabate after some continued oozing w/firm fundus,    Pateint had an uncomplicated postpartum course.  She is ambulating, tolerating a regular diet, passing flatus, and urinating well. Patient is discharged home in stable condition on 01/11/19. She is now considering an IUD for pp contraception.   Magnesium Sulfate recieved: No BMZ received: No  Physical exam  Vitals:   01/10/19 1016 01/10/19 1414 01/10/19 2146 01/11/19 0614  BP: 105/66 114/62 118/79 108/65  Pulse: 81 61 72 72  Resp: 18 16 16 16   Temp: 98.6 F (37 C) 98.1 F (36.7 C) 98.1 F (36.7 C) 98.2 F (36.8 C)  TempSrc: Oral Oral Oral   SpO2: 99% 99% 100% 99%  Weight:       General: alert and cooperative Lochia: appropriate Uterine Fundus: firm Incision: N/A  DVT Evaluation: No evidence of DVT seen on physical exam. Labs: Lab Results  Component Value Date   WBC 8.9 01/09/2019   HGB 12.3 01/09/2019   HCT 35.3 (L) 01/09/2019   MCV 89.8 01/09/2019   PLT 146 (L) 01/09/2019   CMP Latest Ref Rng & Units 01/09/2019  Glucose 65 - 99 mg/dL 80  BUN 6 - 20 mg/dL 4(L)  Creatinine 1.610.57 - 1.00 mg/dL 0.960.60  Sodium 045134 - 409144 mmol/L 141  Potassium 3.5 - 5.2 mmol/L 3.8  Chloride 96 - 106 mmol/L 106  CO2 20 - 29 mmol/L 21  Calcium 8.7 - 10.2 mg/dL 9.4  Total Protein 6.0 - 8.5 g/dL 8.1(X5.8(L)  Total Bilirubin 0.0 - 1.2 mg/dL 0.4  Alkaline Phos 39 - 117 IU/L 137(H)  AST 0 - 40 IU/L 18  ALT 0 - 32 IU/L 7    Discharge instruction: per After Visit Summary and "Baby and Me Booklet".  After visit meds:  Allergies as of 01/11/2019   No Known Allergies     Medication List    STOP taking these medications   acyclovir 400 MG tablet Commonly known as:  ZOVIRAX   calcium carbonate 500 MG chewable tablet Commonly known as:  TUMS - dosed  in mg elemental calcium   Doxylamine-Pyridoxine 10-10 MG Tbec Commonly known as:  Diclegis   triamcinolone ointment 0.1 % Commonly known as:  KENALOG     TAKE these medications   CitraNatal Assure 35-1 & 300 MG tablet One tablet and one capsule daily   ibuprofen 600 MG tablet Commonly known as:  ADVIL Take 1 tablet (600 mg total) by mouth every 6 (six) hours as needed.       Diet: routine diet  Activity: Advance as tolerated. Pelvic rest for 6 weeks.   Outpatient follow up:4 weeks Follow up Appt: Future Appointments  Date Time Provider Department Center  02/13/2019  9:00 AM Cresenzo-Dishmon, Scarlette CalicoFrances, CNM [facility]   Follow up Visit:   Please schedule this patient for Postpartum visit in: 4 weeks with the following provider: Any provider For C/S patients schedule nurse incision check in weeks 2 weeks:  High risk pregnancy complicated by: twins, persistent proteinuria Delivery mode:  SVD Anticipated Birth Control:  Depo; wants BTL (WILL NEED TO COME IN AND SIGN PAPERS) PP Procedures needed:   Schedule Integrated BH visit: no      Newborn Data:   Wilhelmenia BlaseJohnson, GirlA Ekaterini [[id number]][[id number]]  Live born female  Birth Weight: 2965 gm (6lb 8.6oz) APGAR: 9, 9  Newborn Delivery   Birth date/time:  01/09/2019 22:31:00 Delivery type:  Vaginal, Spontaneous      York SpanielJohnson, GirlB Silva [[id number]][[id number]]  Live born female  Birth Weight: 2965 gm (6lb 8.6oz) APGAR: 9, 9  Newborn Delivery   Birth date/time:  01/09/2019 22:48:00 Delivery type:  Vaginal, Spontaneous     Baby Feeding: Bottle and Breast Disposition:home with mother   01/11/2019 Arabella MerlesKimberly D Rowen Hur, CNM  9:07 AM

## 2019-01-09 NOTE — Anesthesia Preprocedure Evaluation (Signed)
Anesthesia Evaluation  Patient identified by MRN, date of birth, ID band Patient awake    Reviewed: Allergy & Precautions, NPO status , Patient's Chart, lab work & pertinent test results  History of Anesthesia Complications Negative for: history of anesthetic complications  Airway Mallampati: II  TM Distance: >3 FB Neck ROM: Full    Dental   Pulmonary neg pulmonary ROS,    breath sounds clear to auscultation       Cardiovascular negative cardio ROS   Rhythm:Regular Rate:Normal     Neuro/Psych negative neurological ROS  negative psych ROS   GI/Hepatic negative GI ROS, Neg liver ROS,   Endo/Other  Morbid obesity  Renal/GU negative Renal ROS     Musculoskeletal negative musculoskeletal ROS (+)   Abdominal   Peds  Hematology  PLT 146k    Anesthesia Other Findings HSV-2  Reproductive/Obstetrics (+) Pregnancy                             Anesthesia Physical Anesthesia Plan  ASA: III  Anesthesia Plan: Epidural   Post-op Pain Management:    Induction:   PONV Risk Score and Plan: 2 and Treatment may vary due to age or medical condition  Airway Management Planned: Natural Airway  Additional Equipment: None  Intra-op Plan:   Post-operative Plan:   Informed Consent: I have reviewed the patients History and Physical, chart, labs and discussed the procedure including the risks, benefits and alternatives for the proposed anesthesia with the patient or authorized representative who has indicated his/her understanding and acceptance.       Plan Discussed with: Anesthesiologist  Anesthesia Plan Comments: (Labs reviewed. Platelets acceptable, patient not taking any blood thinning medications. Per RN, FHR tracing reported to be stable enough for sitting procedure. Risks and benefits discussed with patient, including PDPH, backache, epidural hematoma, failed epidural, allergic reaction,  and nerve injury. Patient expressed understanding and wished to proceed.)        Anesthesia Quick Evaluation

## 2019-01-09 NOTE — Telephone Encounter (Signed)
Preadmission screen  

## 2019-01-09 NOTE — MAU Note (Signed)
Pt presents to MAU with c/o ctx that started today around 10 am after office visit, since then have increased in intensity. Pt was 6cm today in office. Denies VB and LOF. +FM

## 2019-01-09 NOTE — Treatment Plan (Signed)
Induction Assessment Scheduling Form Fax to [facility]:  [phone]  Ineta Kirkland                                                                                   DOB:  Oct 21, 1990                                                            MRN:  [id number]                                                                     Phone #:    [phone]                    Provider:  [facility]  GP:  [id number]                                                            Estimated Date of Delivery: 01/31/19  Dating Criteria: LMP c/w 7wk u/s    Medical Indications for induction:  Di-Di Twins, vtx/vtx Admission Date/Time:  5/8 @ 0730 Gestational age on admission:  38.0   Filed Weights   01/09/19 0916  Weight: 233 lb 9.6 oz (106 kg)   HIV:  Non Reactive (02/28 0908) GBS:  Pos urine  6/50/-2, not currently in labor   Method of induction(proposed):  Pit/AROM   Scheduling Provider Signature:  Cheral Marker, CNM                                            Today's Date:  01/09/2019

## 2019-01-09 NOTE — Anesthesia Procedure Notes (Signed)
Epidural Patient location during procedure: OB Start time: 01/09/2019 9:31 PM End time: 01/09/2019 9:35 PM  Staffing Anesthesiologist: Beryle Lathe, MD Performed: anesthesiologist   Preanesthetic Checklist Completed: patient identified, pre-op evaluation, timeout performed, IV checked, risks and benefits discussed and monitors and equipment checked  Epidural Patient position: sitting Prep: DuraPrep Patient monitoring: continuous pulse ox and blood pressure Approach: midline Location: L3-L4 Injection technique: LOR saline  Needle:  Needle type: Tuohy  Needle gauge: 17 G Needle length: 9 cm Needle insertion depth: 7 cm Catheter size: 19 Gauge Catheter at skin depth: 12 cm Test dose: negative and Other (1% lidocaine)  Assessment Events: blood not aspirated  Additional Notes Patient identified. Risks including, but not limited to, bleeding, infection, nerve damage, paralysis, inadequate analgesia, blood pressure changes, nausea, vomiting, allergic reaction, postpartum back pain, itching, and headache were discussed. Patient expressed understanding and wished to proceed. Sterile prep and drape, including hand hygiene, mask, and sterile gloves were used. The patient was positioned and the spine was prepped. The skin was anesthetized with lidocaine. No paraesthesia or other complication noted. The patient did not experience any signs of intravascular injection such as tinnitus or metallic taste in mouth, nor signs of intrathecal spread such as rapid motor block. Please see nursing notes for vital signs. The patient tolerated the procedure well.   Leslye Peer, MDReason for block:procedure for pain

## 2019-01-09 NOTE — Progress Notes (Signed)
[place] DI/DI twins: BABY A:cephalic right,anterior placenta gr 3,BPP 8/8,FHR 138 bpm,SVP of fluid 2.8 cm BABY B:cephalic left,anterior placenta gr 3,BPP 8/8,FHR 152 BPM,SVP of fluid 6.4 cm

## 2019-01-10 ENCOUNTER — Encounter (HOSPITAL_COMMUNITY): Payer: Self-pay

## 2019-01-10 LAB — COMPREHENSIVE METABOLIC PANEL
ALT: 7 IU/L (ref 0–32)
AST: 18 IU/L (ref 0–40)
Albumin/Globulin Ratio: 1.4 (ref 1.2–2.2)
Albumin: 3.4 g/dL — ABNORMAL LOW (ref 3.9–5.0)
Alkaline Phosphatase: 137 IU/L — ABNORMAL HIGH (ref 39–117)
BUN/Creatinine Ratio: 7 — ABNORMAL LOW (ref 9–23)
BUN: 4 mg/dL — ABNORMAL LOW (ref 6–20)
Bilirubin Total: 0.4 mg/dL (ref 0.0–1.2)
CO2: 21 mmol/L (ref 20–29)
Calcium: 9.4 mg/dL (ref 8.7–10.2)
Chloride: 106 mmol/L (ref 96–106)
Creatinine, Ser: 0.6 mg/dL (ref 0.57–1.00)
GFR calc Af Amer: 145 mL/min/{1.73_m2} (ref >59)
GFR calc non Af Amer: 126 mL/min/{1.73_m2} (ref >59)
Globulin, Total: 2.4 g/dL (ref 1.5–4.5)
Glucose: 80 mg/dL (ref 65–99)
Potassium: 3.8 mmol/L (ref 3.5–5.2)
Sodium: 141 mmol/L (ref 134–144)
Total Protein: 5.8 g/dL — ABNORMAL LOW (ref 6.0–8.5)

## 2019-01-10 LAB — CBC
Hematocrit: 33.1 % — ABNORMAL LOW (ref 34.0–46.6)
Hemoglobin: 11.9 g/dL (ref 11.1–15.9)
MCH: 31.7 pg (ref 26.6–33.0)
MCHC: 36 g/dL — ABNORMAL HIGH (ref 31.5–35.7)
MCV: 88 fL (ref 79–97)
Platelets: 150 10*3/uL (ref 150–450)
RBC: 3.75 x10E6/uL — ABNORMAL LOW (ref 3.77–5.28)
RDW: 12.6 % (ref 11.7–15.4)
WBC: 7.2 10*3/uL (ref 3.4–10.8)

## 2019-01-10 LAB — ABO/RH: ABO/RH(D): A POS

## 2019-01-10 LAB — RPR: RPR Ser Ql: NONREACTIVE

## 2019-01-10 MED ORDER — ACETAMINOPHEN 325 MG PO TABS: 650.0000 mg | ORAL_TABLET | ORAL | Status: DC | PRN

## 2019-01-10 MED ORDER — METHYLERGONOVINE MALEATE 0.2 MG/ML IJ SOLN: 0.2000 mg | INTRAMUSCULAR | Status: DC | PRN

## 2019-01-10 MED ORDER — WITCH HAZEL-GLYCERIN EX PADS: 1.0000 "application " | MEDICATED_PAD | CUTANEOUS | Status: DC | PRN

## 2019-01-10 MED ORDER — TETANUS-DIPHTH-ACELL PERTUSSIS 5-2.5-18.5 LF-MCG/0.5 IM SUSP: 0.5000 mL | Freq: Once | INTRAMUSCULAR | Status: DC

## 2019-01-10 MED ORDER — ONDANSETRON HCL 4 MG/2ML IJ SOLN: 4.0000 mg | INTRAMUSCULAR | Status: DC | PRN

## 2019-01-10 MED ORDER — BISACODYL 10 MG RE SUPP: 10.0000 mg | Freq: Every day | RECTAL | Status: DC | PRN

## 2019-01-10 MED ORDER — ZOLPIDEM TARTRATE 5 MG PO TABS: 5.0000 mg | ORAL_TABLET | Freq: Every evening | ORAL | Status: DC | PRN

## 2019-01-10 MED ORDER — MEASLES, MUMPS & RUBELLA VAC IJ SOLR: 0.5000 mL | Freq: Once | INTRAMUSCULAR | Status: DC

## 2019-01-10 MED ORDER — PRENATAL MULTIVITAMIN CH
1.0000 | ORAL_TABLET | Freq: Every day | ORAL | Status: DC
  Administered 2019-01-10 – 2019-01-11 (×2): 1 via ORAL
  Filled 2019-01-10 (×2): qty 1

## 2019-01-10 MED ORDER — ONDANSETRON HCL 4 MG PO TABS: 4.0000 mg | ORAL_TABLET | ORAL | Status: DC | PRN

## 2019-01-10 MED ORDER — SIMETHICONE 80 MG PO CHEW: 80.0000 mg | CHEWABLE_TABLET | ORAL | Status: DC | PRN

## 2019-01-10 MED ORDER — FERROUS SULFATE 325 (65 FE) MG PO TABS
325.0000 mg | ORAL_TABLET | Freq: Two times a day (BID) | ORAL | Status: DC
  Administered 2019-01-10 – 2019-01-11 (×4): 325 mg via ORAL
  Filled 2019-01-10 (×5): qty 1

## 2019-01-10 MED ORDER — METHYLERGONOVINE MALEATE 0.2 MG PO TABS: 0.2000 mg | ORAL_TABLET | ORAL | Status: DC | PRN

## 2019-01-10 MED ORDER — FLEET ENEMA 7-19 GM/118ML RE ENEM: 1.0000 | ENEMA | Freq: Every day | RECTAL | Status: DC | PRN

## 2019-01-10 MED ORDER — IBUPROFEN 600 MG PO TABS
600.0000 mg | ORAL_TABLET | Freq: Four times a day (QID) | ORAL | Status: DC
  Administered 2019-01-10 – 2019-01-11 (×7): 600 mg via ORAL
  Filled 2019-01-10 (×8): qty 1

## 2019-01-10 MED ORDER — MEDROXYPROGESTERONE ACETATE 150 MG/ML IM SUSP
150.0000 mg | Freq: Once | INTRAMUSCULAR | Status: DC
  Filled 2019-01-10: qty 1

## 2019-01-10 MED ORDER — DIBUCAINE (PERIANAL) 1 % EX OINT: 1.0000 "application " | TOPICAL_OINTMENT | CUTANEOUS | Status: DC | PRN

## 2019-01-10 MED ORDER — DIPHENHYDRAMINE HCL 25 MG PO CAPS: 25.0000 mg | ORAL_CAPSULE | Freq: Four times a day (QID) | ORAL | Status: DC | PRN

## 2019-01-10 MED ORDER — DOCUSATE SODIUM 100 MG PO CAPS
100.0000 mg | ORAL_CAPSULE | Freq: Two times a day (BID) | ORAL | Status: DC
  Administered 2019-01-10 – 2019-01-11 (×3): 100 mg via ORAL
  Filled 2019-01-10 (×3): qty 1

## 2019-01-10 MED ORDER — COCONUT OIL OIL: 1.0000 "application " | TOPICAL_OIL | Status: DC | PRN

## 2019-01-10 MED ORDER — BENZOCAINE-MENTHOL 20-0.5 % EX AERO: 1.0000 "application " | INHALATION_SPRAY | CUTANEOUS | Status: DC | PRN

## 2019-01-10 NOTE — Progress Notes (Signed)
Post Partum Day 1 s/p NSVD of twins Subjective: no complaints, up ad lib, voiding and tolerating PO  Objective: Blood pressure 105/66, pulse 81, temperature 98.6 F (37 C), temperature source Oral, resp. rate 18, weight 105.7 kg, last menstrual period 04/26/2018, SpO2 99 %, unknown if currently breastfeeding.  Physical Exam:  General: alert Lochia: appropriate Uterine Fundus: firm DVT Evaluation: No evidence of DVT seen on physical exam.  Recent Labs    01/09/19 1002 01/09/19 1500  HGB 11.9 12.3  HCT 33.1* 35.3*    Assessment/Plan: Plan for discharge tomorrow  Call [facility] for pp visit for IUD   LOS: 1 day   Saleena Tamas C Amila Callies 01/10/2019, 11:07 AM

## 2019-01-10 NOTE — Lactation Note (Signed)
This note was copied from a baby's chart. Lactation Consultation Note  Patient Name: Amanda Perez CNOBS'J Date: 01/10/2019 Reason for consult: Initial assessment;Early term 37-38.6wks;Multiple gestation P4,  8 hour female ETI twin  infants,   Baby A: Had one void Mom latched both infant same time with assistance from Dad using the football hold, infant latched with wide mouth, nose and chin touching breast in rthymitic sucking  pattern. Infant breastfeed for 15 minutes and was given 1 ml of colostrum by  spoon.  Baby B: Had one void and one stool Mom latched both infant same time with assistance from Dad using the football hold, infant latched with wide mouth, nose and chin touching breast in rthymitic sucking pattern. Infant breastfeed for 10 minutes and was given 2 ml of colostrum by spoon.   Parents will continue to do as much STS as possible. Mom knows to breastfeed infants according hunger cues, 8 or more times within 24 hours. LC discussed ETI behaviors. Mom knows to call Nurse or LC if she has any questions, concerns or need assistance with latching infant to breast.  LC discussed I & O. Reviewed Baby & Me book's Breastfeeding Basics.  Mom made aware of O/P services, breastfeeding support groups, community resources, and our phone # for post-discharge questions.  Maternal Data Formula Feeding for Exclusion: No Has patient been taught Hand Expression?: Yes(Baby A was given 1 ml of colostrum, Baby B given 2 ml of colostrum . ) Does the patient have breastfeeding experience prior to this delivery?: Yes  Feeding Feeding Type: Breast Fed  LATCH Score Latch: Grasps breast easily, tongue down, lips flanged, rhythmical sucking.  Audible Swallowing: A few with stimulation  Type of Nipple: Everted at rest and after stimulation  Comfort (Breast/Nipple): Soft / non-tender  Hold (Positioning): Assistance needed to correctly position infant at breast and maintain  latch.  LATCH Score: 8  Interventions Interventions: Breast feeding basics reviewed;Assisted with latch;Skin to skin;Breast massage;Hand express;Support pillows;Adjust position;Expressed milk;Breast compression  Lactation Tools Discussed/Used WIC Program: No   Consult Status Consult Status: Follow-up Date: 01/10/19 Follow-up type: In-patient    Danelle Earthly 01/10/2019, 6:43 AM

## 2019-01-10 NOTE — Anesthesia Postprocedure Evaluation (Signed)
Anesthesia Post Note  Patient: Amanda Perez  Procedure(s) Performed: AN AD HOC LABOR EPIDURAL     Patient location during evaluation: Mother Baby Anesthesia Type: Epidural Level of consciousness: awake and alert Pain management: pain level controlled Vital Signs Assessment: post-procedure vital signs reviewed and stable Respiratory status: spontaneous breathing, nonlabored ventilation and respiratory function stable Cardiovascular status: stable Postop Assessment: no headache, no backache, epidural receding, no apparent nausea or vomiting, patient able to bend at knees, able to ambulate and adequate PO intake Anesthetic complications: no    Last Vitals:  Vitals:   01/10/19 0232 01/10/19 0632  BP: 115/60 118/74  Pulse: 63 80  Resp: 16 16  Temp: 36.8 C 36.9 C  SpO2: 98% 97%    Last Pain:  Vitals:   01/10/19 0632  TempSrc: Oral  PainSc: 5    Pain Goal:                   Laban Emperor

## 2019-01-11 LAB — URINE CULTURE

## 2019-01-11 MED ORDER — IBUPROFEN 600 MG PO TABS: 600.0000 mg | ORAL_TABLET | Freq: Four times a day (QID) | ORAL | 0 refills | Status: DC | PRN

## 2019-01-11 NOTE — Discharge Instructions (Signed)
Vaginal Delivery, Care After °Refer to this sheet in the next few weeks. These instructions provide you with information about caring for yourself after vaginal delivery. Your health care provider may also give you more specific instructions. Your treatment has been planned according to current medical practices, but problems sometimes occur. Call your health care provider if you have any problems or questions. °What can I expect after the procedure? °After vaginal delivery, it is common to have: °· Some bleeding from your vagina. °· Soreness in your abdomen, your vagina, and the area of skin between your vaginal opening and your anus (perineum). °· Pelvic cramps. °· Fatigue. °Follow these instructions at home: °Medicines °· Take over-the-counter and prescription medicines only as told by your health care provider. °· If you were prescribed an antibiotic medicine, take it as told by your health care provider. Do not stop taking the antibiotic until it is finished. °Driving ° °· Do not drive or operate heavy machinery while taking prescription pain medicine. °· Do not drive for 24 hours if you received a sedative. °Lifestyle °· Do not drink alcohol. This is especially important if you are breastfeeding or taking medicine to relieve pain. °· Do not use tobacco products, including cigarettes, chewing tobacco, or e-cigarettes. If you need help quitting, ask your health care provider. °Eating and drinking °· Drink at least 8 eight-ounce glasses of water every day unless you are told not to by your health care provider. If you choose to breastfeed your baby, you may need to drink more water than this. °· Eat high-fiber foods every day. These foods may help prevent or relieve constipation. High-fiber foods include: °? Whole grain cereals and breads. °? Brown rice. °? Beans. °? Fresh fruits and vegetables. °Activity °· Return to your normal activities as told by your health care provider. Ask your health care provider what  activities are safe for you. °· Rest as much as possible. Try to rest or take a nap when your baby is sleeping. °· Do not lift anything that is heavier than your baby or 10 lb (4.5 kg) until your health care provider says that it is safe. °· Talk with your health care provider about when you can engage in sexual activity. This may depend on your: °? Risk of infection. °? Rate of healing. °? Comfort and desire to engage in sexual activity. °Vaginal Care °· If you have an episiotomy or a vaginal tear, check the area every day for signs of infection. Check for: °? More redness, swelling, or pain. °? More fluid or blood. °? Warmth. °? Pus or a bad smell. °· Do not use tampons or douches until your health care provider says this is safe. °· Watch for any blood clots that may pass from your vagina. These may look like clumps of dark red, brown, or black discharge. °General instructions °· Keep your perineum clean and dry as told by your health care provider. °· Wear loose, comfortable clothing. °· Wipe from front to back when you use the toilet. °· Ask your health care provider if you can shower or take a bath. If you had an episiotomy or a perineal tear during labor and delivery, your health care provider may tell you not to take baths for a certain length of time. °· Wear a bra that supports your breasts and fits you well. °· If possible, have someone help you with household activities and help care for your baby for at least a few days after you   leave the hospital. °· Keep all follow-up visits for you and your baby as told by your health care provider. This is important. °Contact a health care provider if: °· You have: °? Vaginal discharge that has a bad smell. °? Difficulty urinating. °? Pain when urinating. °? A sudden increase or decrease in the frequency of your bowel movements. °? More redness, swelling, or pain around your episiotomy or vaginal tear. °? More fluid or blood coming from your episiotomy or vaginal  tear. °? Pus or a bad smell coming from your episiotomy or vaginal tear. °? A fever. °? A rash. °? Little or no interest in activities you used to enjoy. °? Questions about caring for yourself or your baby. °· Your episiotomy or vaginal tear feels warm to the touch. °· Your episiotomy or vaginal tear is separating or does not appear to be healing. °· Your breasts are painful, hard, or turn red. °· You feel unusually sad or worried. °· You feel nauseous or you vomit. °· You pass large blood clots from your vagina. If you pass a blood clot from your vagina, save it to show to your health care provider. Do not flush blood clots down the toilet without having your health care provider look at them. °· You urinate more than usual. °· You are dizzy or light-headed. °· You have not breastfed at all and you have not had a menstrual period for 12 weeks after delivery. °· You have stopped breastfeeding and you have not had a menstrual period for 12 weeks after you stopped breastfeeding. °Get help right away if: °· You have: °? Pain that does not go away or does not get better with medicine. °? Chest pain. °? Difficulty breathing. °? Blurred vision or spots in your vision. °? Thoughts about hurting yourself or your baby. °· You develop pain in your abdomen or in one of your legs. °· You develop a severe headache. °· You faint. °· You bleed from your vagina so much that you fill two sanitary pads in one hour. °This information is not intended to replace advice given to you by your health care provider. Make sure you discuss any questions you have with your health care provider. °Document Released: 08/25/2000 Document Revised: 02/09/2016 Document Reviewed: 09/12/2015 °Elsevier Interactive Patient Education © 2019 [employer] ° °

## 2019-01-11 NOTE — Lactation Note (Signed)
This note was copied from a baby's chart. Lactation Consultation Note  Patient Name: Amanda Perez [id number] Date: 01/11/2019   It had been noted in chart that infant A had gone about 12 hours between feedings. Ebbie Ridge, RN said that bottles & nipples had been taken into room. RN said she had also reviewed with parents not allowing feedings to go longer than 30 minutes.   Upon entry into room, both infants had just been bottle-fed with Rush Barer. I returned shortly thereafter with crib cards to talk to parents.   I briefly discussed some of issus that LPIs can have. I mentioned not allowing more than 4 hrs go between feedings (while still ensuring 8+ feedings/day) & to please not use pacifiers (pacifiers noted in bassinets). I briefly explained that their LPIs likely needed more calories (b/c of needs related to early gestation) as compared to their older siblings who were born at term.   I explained that most LPIs require supplementation away from the breast (with either EBM or formula). I asked Mom if she had a sense of how often the infants were swallowing at the breast & she did not know. I suggested (as LATCH scores have not been recently done) that Mom plan on supplementing with EBM or formula after every feeding at the breast. Mom reports that she is comfortable with the pump here in the hospital. She has a Lansinoh pump at home.   Parents verbalized understanding of the above in regards to feeding requirements.  Lurline Hare [facility] 01/11/2019, 2:19 PM

## 2019-01-12 ENCOUNTER — Ambulatory Visit: Payer: Self-pay

## 2019-01-12 LAB — GC/CHLAMYDIA PROBE AMP
Chlamydia trachomatis, NAA: NEGATIVE
Neisseria Gonorrhoeae by PCR: NEGATIVE

## 2019-01-12 NOTE — Lactation Note (Signed)
This note was copied from a baby's chart. Lactation Consultation Note  Patient Name: Nathaniel Boodoo UPBDH'D Date: 01/12/2019   Parents declined having any questions for me. Mom is pumping and had some EBM in a bottle. Infants will be seeing their pediatrician tomorrow. I noted that infants had been using Neosure 22. Mom says she will ask her pediatrician tomorrow about what kind of formula to proceed with for supplementation.    Lurline Hare [facility] 01/12/2019, 1:16 PM

## 2019-01-16 ENCOUNTER — Other Ambulatory Visit: Payer: Medicaid Other

## 2019-01-16 ENCOUNTER — Encounter: Payer: Medicaid Other | Admitting: Obstetrics and Gynecology

## 2019-01-16 ENCOUNTER — Encounter: Payer: Medicaid Other | Admitting: Advanced Practice Midwife

## 2019-01-17 ENCOUNTER — Inpatient Hospital Stay (HOSPITAL_COMMUNITY): Payer: Medicaid Other

## 2019-01-17 ENCOUNTER — Encounter: Payer: Medicaid Other | Admitting: Obstetrics & Gynecology

## 2019-01-17 ENCOUNTER — Other Ambulatory Visit: Payer: Medicaid Other

## 2019-01-23 ENCOUNTER — Other Ambulatory Visit: Payer: Medicaid Other

## 2019-01-23 ENCOUNTER — Encounter: Payer: Medicaid Other | Admitting: Women's Health

## 2019-02-12 ENCOUNTER — Encounter: Payer: Self-pay | Admitting: *Deleted

## 2019-02-13 ENCOUNTER — Other Ambulatory Visit: Payer: Self-pay

## 2019-02-13 ENCOUNTER — Encounter: Payer: Self-pay | Admitting: Advanced Practice Midwife

## 2019-02-13 ENCOUNTER — Ambulatory Visit (INDEPENDENT_AMBULATORY_CARE_PROVIDER_SITE_OTHER): Payer: Medicaid Other | Admitting: Advanced Practice Midwife

## 2019-02-13 NOTE — Progress Notes (Signed)
TELEHEALTH POSTPARTUM VIRTUAL VIDEO VISIT ENCOUNTER NOTE  Provider location: [facility]   I connected with Amanda Perez on 02/13/19 at  9:00 AM EDT by WebEx Video Encounter at home and verified that I am speaking with the correct person using two identifiers.    I discussed the limitations, risks, security and privacy concerns of performing an evaluation and management service by telephone and the availability of in person appointments. I also discussed with the patient that there may be a patient responsible charge related to this service. The patient expressed understanding and agreed to proceed.  Appointment Date: 02/13/2019  OBGYN Clinic: [facility]  Chief Complaint: Postpartum Visit  History of Present Illness: Amanda Perez is a 28 y.o. African-American [id number] being evaluated for postpartum followup.    She is s/p normal spontaneous vaginal delivery of twins on 4/20 at 36.6 weeks; she was discharged to home on PPD#1. Pregnancy complicated by nothing. Babies are doing well.  Complains of nothing, feels well  Vaginal bleeding or discharge: No  Intercourse: No  Contraception: IUD Mode of feeding infant: Bottle and Breast One baby latches better than the other, so pumped some breast milk and feeding by bottle (husband helps!).  Supplementing both babies w/some formula.  PP depression s/s: No .  Any bowel or bladder issues: No  Pap smear:no abnormalities (date: 08/04/17)  Review of Systems:  Her 12 point review of systems is negative or as noted in the History of Present Illness.  Patient Active Problem List   Diagnosis Date Noted  . Proteinuria affecting pregnancy in third trimester 12/19/2018  . Sciatica neuralgia 08/16/2018  . GBS bacteriuria 07/29/2018  . Supervision of high risk pregnancy, antepartum 06/28/2018  . Twin gestation, dichorionic diamniotic 06/28/2018  . UTI (urinary tract infection) during pregnancy, first trimester  06/28/2018  . History of postpartum hemorrhage, currently pregnant 06/28/2018  . HSV-2 (herpes simplex virus 2) infection     Medications Amanda Perez had no medications administered during this visit. Current Outpatient Medications  Medication Sig Dispense Refill  . ibuprofen (ADVIL) 600 MG tablet Take 1 tablet (600 mg total) by mouth every 6 (six) hours as needed. 30 tablet 0  . Prenat w/o A-FeCbGl-DSS-FA-DHA (CITRANATAL ASSURE) 35-1 & 300 MG tablet One tablet and one capsule daily 60 tablet 11   No current facility-administered medications for this visit.     Allergies Patient has no known allergies.  Physical Exam:  Ht 5\' 3"  (1.6 m)   Wt 195 lb (88.5 kg)   LMP 04/26/2018   Breastfeeding Yes   BMI 34.54 kg/m   General:  Alert, oriented and cooperative. Patient is in no acute distress.  Mental Status: Normal mood and affect. Normal behavior. Normal judgment and thought content.   Respiratory: Normal respiratory effort noted, no problems with respiration noted  Rest of physical exam deferred due to type of encounter  PP Depression Screening:   Edinburgh Postnatal Depression Scale - 02/13/19 0907      Edinburgh Postnatal Depression Scale:  In the Past 7 Days   I have been able to laugh and see the funny side of things.  0    I have looked forward with enjoyment to things.  0    I have blamed myself unnecessarily when things went wrong.  1    I have been anxious or worried for no good reason.  1    I have felt scared or panicky for no good reason.  0    Things have been getting on top of me.  1    I have been so unhappy that I have had difficulty sleeping.  0    I have felt sad or miserable.  1    I have been so unhappy that I have been crying.  0    The thought of harming myself has occurred to me.  0    Edinburgh Postnatal Depression Scale Total  4       Assessment:Patient is a 28 y.o. [id number] who is 6 weeks postpartum from a normal spontaneous vaginal delivery of  twins.  She is doing very well.   Plan: Liletta IUD 1. Routine postpartum follow-up Note to return to work at 8 weeks (twins) sent via mychart    RTC 1-2 weeks for IUD.  No sex until then  I discussed the assessment and treatment plan with the patient. The patient was provided an opportunity to ask questions and all were answered. The patient agreed with the plan and demonstrated an understanding of the instructions.   The patient was advised to call back or seek an in-person evaluation/go to the ED for any concerning postpartum symptoms.  I provided 10 minutes of face-to-face time during this encounter.   Jacklyn ShellFrances Cresenzo-Dishmon, CNM [facility], [facility]

## 2019-02-25 ENCOUNTER — Encounter: Payer: Self-pay | Admitting: *Deleted

## 2019-02-26 ENCOUNTER — Ambulatory Visit (INDEPENDENT_AMBULATORY_CARE_PROVIDER_SITE_OTHER): Payer: Medicaid Other | Admitting: Obstetrics and Gynecology

## 2019-02-26 ENCOUNTER — Other Ambulatory Visit: Payer: Self-pay

## 2019-02-26 ENCOUNTER — Encounter: Payer: Self-pay | Admitting: Obstetrics and Gynecology

## 2019-02-26 VITALS — BP 123/80 | HR 68 | Ht 63.0 in | Wt 198.0 lb

## 2019-02-26 DIAGNOSIS — Z30430 Encounter for insertion of intrauterine contraceptive device: Secondary | ICD-10-CM | POA: Diagnosis not present

## 2019-02-26 DIAGNOSIS — Z3202 Encounter for pregnancy test, result negative: Secondary | ICD-10-CM | POA: Diagnosis not present

## 2019-02-26 LAB — POCT URINE PREGNANCY: Preg Test, Ur: NEGATIVE

## 2019-02-26 MED ORDER — LEVONORGESTREL 19.5 MCG/DAY IU IUD
INTRAUTERINE_SYSTEM | Freq: Once | INTRAUTERINE | Status: AC
  Administered 2019-02-26: 1 via INTRAUTERINE

## 2019-02-26 NOTE — Progress Notes (Signed)
Patient ID: Amanda Perez, female   DOB: March 03, 1991, 28 y.o.   MRN: [id number]   [facility] PROCEDURE NOTE  Amanda Perez is a 28 y.o. [phone] here for [place] IUD insertion.  No GYN concerns.  Last pap smear was on 07/25/2017 and was normal. Has 74 week old twins and is breastfeeding.  IUD Insertion Procedure Note Patient identified, informed consent performed, consent signed.   Discussed risks of irregular bleeding, cramping, infection, malpositioning or misplacement of the IUD outside the uterus which may require further procedure such as laparoscopy. Time out was performed.  Urine pregnancy test negative.  Speculum placed in the vagina.  Cervix visualized.  Cleaned with Betadine x 2.  Grasped anteriorly with a single tooth tenaculum.   Uterus sounded to 9.5 cm.   IUD placed per manufacturer's recommendations.  Strings trimmed to 2.5 cm. Tenaculum was removed, good hemostasis noted.  Patient tolerated procedure well.  Transvaginal Ultrasound was used to confirm IUD position which is optimal.  Patient was given post-procedure instructions.  She was advised to have backup contraception for one week.  Patient was also asked to check IUD strings periodically.  Follow up in 4 weeks for IUD check.    By signing my name below, I, Samul Dada, attest that this documentation has been prepared under the direction and in the presence of Jonnie Kind, MD. Electronically Signed: [place]. 02/26/19. 1:42 PM.  I personally performed the services described in this documentation, which was SCRIBED in my presence. The recorded information has been reviewed and considered accurate. It has been edited as necessary during review. Jonnie Kind, MD

## 2019-03-13 DIAGNOSIS — Z20828 Contact with and (suspected) exposure to other viral communicable diseases: Secondary | ICD-10-CM | POA: Diagnosis not present

## 2019-03-26 ENCOUNTER — Encounter: Payer: Self-pay | Admitting: Obstetrics and Gynecology

## 2019-03-26 ENCOUNTER — Other Ambulatory Visit: Payer: Self-pay

## 2019-03-26 ENCOUNTER — Ambulatory Visit (INDEPENDENT_AMBULATORY_CARE_PROVIDER_SITE_OTHER): Payer: Medicaid Other | Admitting: Obstetrics and Gynecology

## 2019-03-26 VITALS — BP 108/71 | HR 72 | Ht 63.0 in | Wt 204.4 lb

## 2019-03-26 DIAGNOSIS — Z30431 Encounter for routine checking of intrauterine contraceptive device: Secondary | ICD-10-CM | POA: Diagnosis not present

## 2019-03-26 NOTE — Progress Notes (Signed)
Patient ID: Amanda Perez, female   DOB: [birth year], 28 y.o.   MRN: [id number]  GYNECOLOGY OFFICE PROGRESS NOTE  History:  28 y.o. [id number] here today for today for IUD string check; Liletta type of IUD was placed on  02/26/2019. Still having bleeding. Bleed the day of insertion on 02/26/2019. Stopped bleeding for a week then bleed for 2 week. Bleeding has since then stopped. Doesn't want to take out IUD.  Believes she has gained 10 lbs. Has changed her meal portion sizes, may eat a snack before eating lunch or dinner.  The following portions of the patient's history were reviewed and updated as appropriate: allergies, current medications, past family history, past medical history, past social history, past surgical history and problem list.  Review of Systems:  Pertinent items are noted in HPI.   Objective:  Physical Exam currently breastfeeding. CONSTITUTIONAL: Well-developed, well-nourished female in no acute distress.  HENT:  Normocephalic, atraumatic. External right and left ear normal. Oropharynx is clear and moist EYES: Conjunctivae and EOM are normal. Pupils are equal, round, and reactive to light. No scleral icterus.  NECK: Normal range of motion, supple, no masses CARDIOVASCULAR: Normal heart rate noted RESPIRATORY: Effort and breath sounds normal, no problems with respiration noted ABDOMEN: Soft, no distention noted.   PELVIC: Normal appearing external genitalia; normal appearing vaginal mucosa and cervix.  IUD strings visualized, about 2 cm in length outside cervix. Small brown discharge consistent with ending of menses Ultrasound was not  used to reassess iud position Assessment & Plan:  Normal IUD check. Patient to keep IUD in place for five years; can come in for removal if she desires pregnancy within the next five years. Routine preventative health maintenance measures emphasized. Follow bleeding and weight issues for now. By signing my name below, I, Samul Dada, attest  that this documentation has been prepared under the direction and in the presence of Jonnie Kind, MD. Electronically Signed: [place]. 03/26/19. 11:25 AM.  I personally performed the services described in this documentation, which was SCRIBED in my presence. The recorded information has been reviewed and considered accurate. It has been edited as necessary during review. Jonnie Kind, MD

## 2019-05-14 DIAGNOSIS — H5213 Myopia, bilateral: Secondary | ICD-10-CM | POA: Diagnosis not present

## 2019-06-18 DIAGNOSIS — H52221 Regular astigmatism, right eye: Secondary | ICD-10-CM | POA: Diagnosis not present

## 2019-06-18 DIAGNOSIS — H5213 Myopia, bilateral: Secondary | ICD-10-CM | POA: Diagnosis not present

## 2019-10-14 ENCOUNTER — Other Ambulatory Visit: Payer: Self-pay | Admitting: Advanced Practice Midwife

## 2019-10-14 ENCOUNTER — Telehealth: Payer: Self-pay | Admitting: *Deleted

## 2019-10-14 MED ORDER — NYSTATIN 100000 UNIT/GM EX CREA: 1.0000 "application " | TOPICAL_CREAM | Freq: Three times a day (TID) | CUTANEOUS | 1 refills | Status: DC

## 2019-10-14 NOTE — Telephone Encounter (Signed)
Patient states her baby has thrush.  She is requesting medication be sent in for her as her nipples are itching and slightly irritated.

## 2019-11-02 ENCOUNTER — Other Ambulatory Visit: Payer: Self-pay | Admitting: Women's Health

## 2020-02-06 DIAGNOSIS — L2084 Intrinsic (allergic) eczema: Secondary | ICD-10-CM | POA: Diagnosis not present

## 2020-02-06 DIAGNOSIS — L700 Acne vulgaris: Secondary | ICD-10-CM | POA: Diagnosis not present

## 2020-02-27 ENCOUNTER — Encounter: Payer: Self-pay | Admitting: Obstetrics and Gynecology

## 2020-02-27 ENCOUNTER — Ambulatory Visit: Payer: Medicaid Other | Admitting: Obstetrics and Gynecology

## 2020-02-27 VITALS — BP 121/76 | HR 98 | Ht 63.0 in | Wt 220.4 lb

## 2020-02-27 DIAGNOSIS — R300 Dysuria: Secondary | ICD-10-CM | POA: Diagnosis not present

## 2020-02-27 LAB — POCT URINALYSIS DIPSTICK OB
Glucose, UA: NEGATIVE
Nitrite, UA: NEGATIVE

## 2020-02-27 MED ORDER — SULFAMETHOXAZOLE-TRIMETHOPRIM 400-80 MG PO TABS: 1.0000 | ORAL_TABLET | Freq: Two times a day (BID) | ORAL | 0 refills | Status: DC

## 2020-02-27 NOTE — Patient Instructions (Signed)

## 2020-02-27 NOTE — Progress Notes (Signed)
Patient ID: Amanda Perez, female   DOB: 02-02-1991, 29 y.o.   MRN: [id number]    [facility] Clinic Visit  @DATE @            Patient name: Amanda Perez MRN Amanda Perez  Date of birth: [birth year]  CC & HPI:  Amanda Perez is a 29 y.o. female presenting today for a possible UTI. She reports cloudy urine, foul odor, and discomfort with urination. She does not have UTIs often. Her periods are irregular at this time due to her IUD. The last time she had intercourse was about 1 week ago. The patient denies fever, chills or any other symptoms or complaints at this time.   ROS:  ROS  + cloudy urine + foul odor + dysuria - fever - chills All systems are negative except as noted in the HPI and PMH.   Pertinent History Reviewed:   Reviewed: Medical         Past Medical History:  Diagnosis Date  . HSV-2 (herpes simplex virus 2) infection   . Other disorder of menstruation and other abnormal bleeding from female genital tract 07/10/2013   Bleeding daily since IUD inserted  . Pregnant 12/29/2014  . Pregnant                               Surgical Hx:    Past Surgical History:  Procedure Laterality Date  . DILATION AND EVACUATION N/A 08/15/2015   Procedure: DILATATION and curretage, repair of laceration;  Surgeon: 14/12/2014, MD;  Location: [facility];  Service: Gynecology;  Laterality: N/A;   Medications: Reviewed & Updated - see associated section                       Current Outpatient Medications:  .  clindamycin (CLEOCIN T) 1 % lotion, Apply to acne on face and back, Disp: , Rfl:  .  spironolactone (ALDACTONE) 50 MG tablet, Take by mouth., Disp: , Rfl:  .  ibuprofen (ADVIL) 600 MG tablet, Take 1 tablet (600 mg total) by mouth every 6 (six) hours as needed. (Patient not taking: Reported on 02/27/2020), Disp: 30 tablet, Rfl: 0 .  nystatin cream (MYCOSTATIN), Apply 1 application topically 3 (three) times daily. (Patient not taking: Reported on 02/27/2020), Disp: 30 g, Rfl: 1 .  Prenat  w/o A-FeCbGl-DSS-FA-DHA (CITRANATAL ASSURE) 35-1 & 300 MG tablet, TAKE 1 TABLET BY MOUTH DAILY AND 1 CAPSULE DAILY (Patient not taking: Reported on 02/27/2020), Disp: 60 each, Rfl: 12   Social History: Reviewed -  reports that she has never smoked. She has never used smokeless tobacco.  Objective Findings:  Vitals: Blood pressure 121/76, pulse 98, height 5\' 3"  (1.6 m), weight 220 lb 6.4 oz (100 kg), currently breastfeeding.  PHYSICAL EXAMINATION General appearance - alert, well appearing, and in no distress, oriented to person, place, and time and overweight Mental status - alert, oriented to person, place, and time, normal mood, behavior, speech, dress, motor activity, and thought processes, affect appropriate to mood  PELVIC DEFERRED  Assessment & Plan:   A:  1.  UTI  P:  1. Rx Septra BID x 5 days 2. F/U in 2 weeks for POC  By signing my name below, I, 02/29/2020, attest that this documentation has been prepared under the direction and in the presence of , MD. Electronically Signed: Pietro Cassis, Medical Scribe. 02/27/20. 9:59 AM.  I personally  performed the services described in this documentation, which was SCRIBED in my presence. The recorded information has been reviewed and considered accurate. It has been edited as necessary during review. Tilda Burrow, MD

## 2020-03-01 ENCOUNTER — Telehealth: Payer: Self-pay | Admitting: Obstetrics and Gynecology

## 2020-03-01 LAB — URINE CULTURE

## 2020-03-01 NOTE — Telephone Encounter (Signed)
Telephoned patient at home number advised patient was safe to take Bactrim while breastfeeding. Patient voiced understanding.

## 2020-03-01 NOTE — Progress Notes (Signed)
E coli growing on the urine culture, will continue current meds, sulfamethoxazole/Trimethoprim 400/80, and wait for final culture.

## 2020-03-01 NOTE — Telephone Encounter (Signed)
Pt seen Amanda Perez on Friday meds that was given cant be taken while she is breast feeding. Please call pt and advise

## 2020-03-11 ENCOUNTER — Ambulatory Visit: Payer: Medicaid Other | Admitting: Obstetrics and Gynecology

## 2020-03-17 ENCOUNTER — Ambulatory Visit: Payer: Medicaid Other | Admitting: Student

## 2021-02-21 ENCOUNTER — Other Ambulatory Visit: Payer: Self-pay

## 2021-02-22 ENCOUNTER — Ambulatory Visit (INDEPENDENT_AMBULATORY_CARE_PROVIDER_SITE_OTHER): Payer: 59 | Admitting: Obstetrics & Gynecology

## 2021-02-22 ENCOUNTER — Encounter: Payer: Self-pay | Admitting: Obstetrics & Gynecology

## 2021-02-22 ENCOUNTER — Other Ambulatory Visit: Payer: Self-pay

## 2021-02-22 VITALS — BP 129/84 | HR 79 | Ht 66.0 in | Wt 202.0 lb

## 2021-02-22 DIAGNOSIS — Z3009 Encounter for other general counseling and advice on contraception: Secondary | ICD-10-CM

## 2021-02-22 NOTE — Progress Notes (Signed)
Chief Complaint  Patient presents with   IUD Removal    Discuss BTL      30 y.o. [id number] Patient's last menstrual period was 02/08/2021. The current method of family planning is IUD.  Outpatient Encounter Medications as of 02/22/2021  Medication Sig   triamcinolone (KENALOG) 0.025 % cream Apply 1 application topically 2 (two) times daily.   clindamycin (CLEOCIN T) 1 % lotion Apply to acne on face and back (Patient not taking: Reported on 02/22/2021)   ibuprofen (ADVIL) 600 MG tablet Take 1 tablet (600 mg total) by mouth every 6 (six) hours as needed. (Patient not taking: No sig reported)   nystatin cream (MYCOSTATIN) Apply 1 application topically 3 (three) times daily. (Patient not taking: No sig reported)   Prenat w/o A-FeCbGl-DSS-FA-DHA (CITRANATAL ASSURE) 35-1 & 300 MG tablet TAKE 1 TABLET BY MOUTH DAILY AND 1 CAPSULE DAILY (Patient not taking: No sig reported)   spironolactone (ALDACTONE) 50 MG tablet Take by mouth. (Patient not taking: Reported on 02/22/2021)   sulfamethoxazole-trimethoprim (BACTRIM) 400-80 MG tablet Take 1 tablet by mouth 2 (two) times daily. Twice daily for uti (Patient not taking: Reported on 02/22/2021)   No facility-administered encounter medications on file as of 02/22/2021.    Subjective Pt has decided she wants to have permanent sterilization She has an IUD but wants to move forward with a tubal, she opts for bilateral salpingectomy for the lower failure rate and ovarian cancer prophylaxis She also wants her IUD removed then as well Past Medical History:  Diagnosis Date   HSV-2 (herpes simplex virus 2) infection    Other disorder of menstruation and other abnormal bleeding from female genital tract 07/10/2013   Bleeding daily since IUD inserted   Pregnant 12/29/2014   Pregnant     Past Surgical History:  Procedure Laterality Date   DILATION AND EVACUATION N/A 08/15/2015   Procedure: DILATATION and curretage, repair of laceration;  Surgeon:  Tereso Newcomer, MD;  Location: [facility];  Service: Gynecology;  Laterality: N/A;    OB History     Gravida  3   Para  3   Term  2   Preterm  1   AB      Living  4      SAB      IAB      Ectopic      Multiple  1   Live Births  4           No Known Allergies  Social History   Socioeconomic History   Marital status: Single    Spouse name: Not on file   Number of children: 4   Years of education: 12+   Highest education level: Associate degree: occupational, Scientist, product/process development, or vocational program  Occupational History   Not on file  Tobacco Use   Smoking status: Never   Smokeless tobacco: Never  Vaping Use   Vaping Use: Never used  Substance and Sexual Activity   Alcohol use: Not Currently   Drug use: No   Sexual activity: Not Currently    Birth control/protection: None  Other Topics Concern   Not on file  Social History Narrative   Not on file   Social Determinants of Health   Financial Resource Strain: Not on file  Food Insecurity: Not on file  Transportation Needs: Not on file  Physical Activity: Not on file  Stress: Not on file  Social Connections: Not on file    Family  History  Problem Relation Age of Onset   Hypertension Mother    Seizures Mother    Hypertension Paternal Grandmother    Hypertension Maternal Grandmother     Medications:       Current Outpatient Medications:    triamcinolone (KENALOG) 0.025 % cream, Apply 1 application topically 2 (two) times daily., Disp: , Rfl:    clindamycin (CLEOCIN T) 1 % lotion, Apply to acne on face and back (Patient not taking: Reported on 02/22/2021), Disp: , Rfl:    ibuprofen (ADVIL) 600 MG tablet, Take 1 tablet (600 mg total) by mouth every 6 (six) hours as needed. (Patient not taking: No sig reported), Disp: 30 tablet, Rfl: 0   nystatin cream (MYCOSTATIN), Apply 1 application topically 3 (three) times daily. (Patient not taking: No sig reported), Disp: 30 g, Rfl: 1   Prenat w/o  A-FeCbGl-DSS-FA-DHA (CITRANATAL ASSURE) 35-1 & 300 MG tablet, TAKE 1 TABLET BY MOUTH DAILY AND 1 CAPSULE DAILY (Patient not taking: No sig reported), Disp: 60 each, Rfl: 12   spironolactone (ALDACTONE) 50 MG tablet, Take by mouth. (Patient not taking: Reported on 02/22/2021), Disp: , Rfl:    sulfamethoxazole-trimethoprim (BACTRIM) 400-80 MG tablet, Take 1 tablet by mouth 2 (two) times daily. Twice daily for uti (Patient not taking: Reported on 02/22/2021), Disp: 10 tablet, Rfl: 0  Objective Blood pressure 129/84, pulse 79, height 5\' 6"  (1.676 m), weight 202 lb (91.6 kg), last menstrual period 02/08/2021, currently breastfeeding.  Gen [facility] NAD  Pertinent ROS No burning with urination, frequency or urgency No nausea, vomiting or diarrhea Nor fever chills or other constitutional symptoms   Labs or studies     Impression Diagnoses this Encounter::   ICD-10-CM   1. Encounter for consultation for female sterilization  Z30.09    + IUD removal 04/06/21 [facility]      Established relevant diagnosis(es):   Plan/Recommendations: No orders of the defined types were placed in this encounter.   Labs or Scans Ordered: No orders of the defined types were placed in this encounter.   Management:: As above, talked with Daisy no tubal papers need to be signed as [facility] is primary insurance  Follow up Return in about 7 weeks (around 04/15/2021) for Post Op, 06/15/2021 visit, with Dr [employer].      All questions were answered.

## 2021-04-08 ENCOUNTER — Other Ambulatory Visit (HOSPITAL_COMMUNITY): Payer: 59

## 2021-04-12 ENCOUNTER — Telehealth: Payer: 59 | Admitting: Obstetrics & Gynecology

## 2021-04-12 NOTE — Patient Instructions (Addendum)
Amanda Perez  04/12/2021     @   Your procedure is scheduled on  04/20/2021.   Report to [facility] at  0700  A.M.   Call this number if you have problems the morning of surgery:  [phone]   Remember:  Do not eat  after midnight.   You may drink clear liquids until  0430 AM 04/20/2021  .    Clear liquids allowed are:                    Water, Juice (non-citric and without pulp - diabetics please choose diet or no sugar options), Carbonated beverages - (diabetics please choose diet or no sugar options), Clear Tea, Black Coffee only (no creamer, milk or cream including half and half), Plain Jell-O only (diabetics please choose diet or no sugar options), Gatorade (diabetics please choose diet or no sugar options), and Plain Popsicles only    At 0430 AM 04/20/2021, drink your carb drink and them you cannot have anything else to drink.    Take these medicines the morning of surgery with A SIP OF WATER                                   NONE     Do not wear jewelry, make-up or nail polish.  Do not wear lotions, powders, or perfumes, or deodorant.  Do not shave 48 hours prior to surgery.  Men may shave face and neck.  Do not bring valuables to the hospital.  [facility] is not responsible for any belongings or valuables.  Contacts, dentures or bridgework may not be worn into surgery.  Leave your suitcase in the car.  After surgery it may be brought to your room.  For patients admitted to the hospital, discharge time will be determined by your treatment team.  Patients discharged the day of surgery will not be allowed to drive home and must have someone with them for 24 hours.    Special instructions:   DO NOT smoke tobacco or vape for 24 hours before your procedure.  Please read over the following fact sheets that you were given. Coughing and Deep Breathing, Surgical Site Infection Prevention, Anesthesia Post-op Instructions, and Care and  Recovery After Surgery      Salpingectomy, Care After The following information offers guidance on how to care for yourself after your procedure. Your health care provider may also give you more specific instructions. If you have problems or questions, contact your health careprovider. What can I expect after the procedure? After the procedure, it is common to have: Pain in your abdomen. Light vaginal bleeding (spotting) for a few days. Tiredness. Your recovery time will depend on which method was used for your surgery. Follow these instructions at home: Medicines Take over-the-counter and prescription medicines only as told by your health care provider. Ask your health care provider if the medicine prescribed to you: Requires you to avoid driving or using machinery. Can cause constipation. You may need to take actions to prevent or treat constipation, such as: Drink enough fluid to keep your urine pale yellow. Take over-the-counter or prescription medicines. Eat foods that are high in fiber, such as beans, whole grains, and fresh fruits and vegetables. Limit foods that are high in fat and processed sugars, such as fried or sweet foods. Incision care  Follow instructions from your  health care provider about how to take care of your incision or incisions. Make sure you: Wash your hands with soap and water for at least 20 seconds before and after you change your bandage (dressing). If soap and water are not available, use hand sanitizer. Change or remove your dressing as told by your health care provider. Leave stitches (sutures), skin glue, staples, or adhesive strips in place. These skin closures may need to stay in place for 2 weeks or longer. If adhesive strip edges start to loosen and curl up, you may trim the loose edges. Do not remove adhesive strips completely unless your health care provider tells you to do that. Keep your dressing clean and dry. Check your incision area every day  for signs of infection. Check for: Redness, swelling, or pain that gets worse. Fluid or blood. Warmth. Pus or a bad smell.  Activity Rest as told by your health care provider. Avoid sitting for a long time without moving. Get up to take short walks every 1-2 hours. This is important to improve blood flow and breathing. Ask for help if you feel weak or unsteady. Return to your normal activities as told by your health care provider. Ask your health care provider what activities are safe for you. Do not drive until your health care provider says that it is safe. Do not lift anything that is heavier than 10 lb (4.5 kg), or the limit that you are told, until your health care provider says that it is safe. This may last for 2-6 weeks depending on your surgery. Do not douche, use tampons, or have sex until your health care provider approves. General instructions Do not use any products that contain nicotine or tobacco. These products include cigarettes, chewing tobacco, and vaping devices, such as e-cigarettes. These can delay healing after surgery. If you need help quitting, ask your health care provider. Wear compression stockings as told by your health care provider. These stockings help to prevent blood clots and reduce swelling in your legs. Do not take baths, swim, or use a hot tub until your health care provider approves. You may take showers. Keep all follow-up visits. This is important. Contact a health care provider if: You have pain when you urinate. You have redness, swelling, or more pain around an incision or an incision feels warm to the touch. You have pus, fluid, blood, or a bad smell coming from an incision or an incision starts to open. You have a fever. You have abdominal pain that gets worse or does not get better with medicine. You have a rash. You feel light-headed, have nausea and vomiting, or both. Get help right away if: You have pain in your chest or leg. You develop  shortness of breath. You faint. You have increased or heavy vaginal bleeding, such as soaking a sanitary napkin in an hour. These symptoms may represent a serious problem that is an emergency. Do not wait to see if the symptoms will go away. Get medical help right away. Call your local emergency services (911 in the [place] Do not drive yourself to the hospital. Summary After the procedure, it is common to feel tired, have pain in your abdomen, and have light vaginal bleeding for a few days. Follow instructions from your health care provider about how to take care of your incision or incisions. Return to your normal activities as told by your health care provider. Ask your health care provider what activities are safe for you. Do not  douche, use tampons, or have sex until your health care provider approves. Keep all follow-up visits. This is important. This information is not intended to replace advice given to you by your health care provider. Make sure you discuss any questions you have with your healthcare provider. Document Revised: 07/20/2020 Document Reviewed: 07/20/2020 Elsevier Patient Education  2022 [employer] General Anesthesia, Adult, Care After This sheet gives you information about how to care for yourself after your procedure. Your health care provider may also give you more specific instructions. If you have problems or questions, contact your health careprovider. What can I expect after the procedure? After the procedure, the following side effects are common: Pain or discomfort at the IV site. Nausea. Vomiting. Sore throat. Trouble concentrating. Feeling cold or chills. Feeling weak or tired. Sleepiness and fatigue. Soreness and body aches. These side effects can affect parts of the body that were not involved in surgery. Follow these instructions at home: For the time period you were told by your health care provider:  Rest. Do not participate in activities where  you could fall or become injured. Do not drive or use machinery. Do not drink alcohol. Do not take sleeping pills or medicines that cause drowsiness. Do not make important decisions or sign legal documents. Do not take care of children on your own.  Eating and drinking Follow any instructions from your health care provider about eating or drinking restrictions. When you feel hungry, start by eating small amounts of foods that are soft and easy to digest (bland), such as toast. Gradually return to your regular diet. Drink enough fluid to keep your urine pale yellow. If you vomit, rehydrate by drinking water, juice, or clear broth. General instructions If you have sleep apnea, surgery and certain medicines can increase your risk for breathing problems. Follow instructions from your health care provider about wearing your sleep device: Anytime you are sleeping, including during daytime naps. While taking prescription pain medicines, sleeping medicines, or medicines that make you drowsy. Have a responsible adult stay with you for the time you are told. It is important to have someone help care for you until you are awake and alert. Return to your normal activities as told by your health care provider. Ask your health care provider what activities are safe for you. Take over-the-counter and prescription medicines only as told by your health care provider. If you smoke, do not smoke without supervision. Keep all follow-up visits as told by your health care provider. This is important. Contact a health care provider if: You have nausea or vomiting that does not get better with medicine. You cannot eat or drink without vomiting. You have pain that does not get better with medicine. You are unable to pass urine. You develop a skin rash. You have a fever. You have redness around your IV site that gets worse. Get help right away if: You have difficulty breathing. You have chest pain. You have  blood in your urine or stool, or you vomit blood. Summary After the procedure, it is common to have a sore throat or nausea. It is also common to feel tired. Have a responsible adult stay with you for the time you are told. It is important to have someone help care for you until you are awake and alert. When you feel hungry, start by eating small amounts of foods that are soft and easy to digest (bland), such as toast. Gradually return to your regular diet. Drink enough fluid to  keep your urine pale yellow. Return to your normal activities as told by your health care provider. Ask your health care provider what activities are safe for you. This information is not intended to replace advice given to you by your health care provider. Make sure you discuss any questions you have with your healthcare provider. Document Revised: 05/13/2020 Document Reviewed: 12/11/2019 Elsevier Patient Education  2022 Else[employer] Inc. How to Use Chlorhexidine for Bathing Chlorhexidine gluconate (CHG) is a germ-killing (antiseptic) solution that is used to clean the skin. It can get rid of the bacteria that normally live on the skin and can keep them away for about 24 hours. To clean your skin with CHG, you may be given: A CHG solution to use in the shower or as part of a sponge bath. A prepackaged cloth that contains CHG. Cleaning your skin with CHG may help lower the risk for infection: While you are staying in the intensive care unit of the hospital. If you have a vascular access, such as a central line, to provide short-term or long-term access to your veins. If you have a catheter to drain urine from your bladder. If you are on a ventilator. A ventilator is a machine that helps you breathe by moving air in and out of your lungs. After surgery. What are the risks? Risks of using CHG include: A skin reaction. Hearing loss, if CHG gets in your ears. Eye injury, if CHG gets in your eyes and is not rinsed out. The  CHG product catching fire. Make sure that you avoid smoking and flames after applying CHG to your skin. Do not use CHG: If you have a chlorhexidine allergy or have previously reacted to chlorhexidine. On babies younger than 752 months of age. How to use CHG solution Use CHG only as told by your health care provider, and follow the instructions on the label. Use the full amount of CHG as directed. Usually, this is one bottle. During a shower Follow these steps when using CHG solution during a shower (unless your health care provider gives you different instructions): Start the shower. Use your normal soap and shampoo to wash your face and hair. Turn off the shower or move out of the shower stream. Pour the CHG onto a clean washcloth. Do not use any type of brush or rough-edged sponge. Starting at your neck, lather your body down to your toes. Make sure you follow these instructions: If you will be having surgery, pay special attention to the part of your body where you will be having surgery. Scrub this area for at least 1 minute. Do not use CHG on your head or face. If the solution gets into your ears or eyes, rinse them well with water. Avoid your genital area. Avoid any areas of skin that have broken skin, cuts, or scrapes. Scrub your back and under your arms. Make sure to wash skin folds. Let the lather sit on your skin for 1-2 minutes or as long as told by your health care provider. Thoroughly rinse your entire body in the shower. Make sure that all body creases and crevices are rinsed well. Dry off with a clean towel. Do not put any substances on your body afterward--such as powder, lotion, or perfume--unless you are told to do so by your health care provider. Only use lotions that are recommended by the manufacturer. Put on clean clothes or pajamas. If it is the night before your surgery, sleep in clean sheets.  During a sponge  bath Follow these steps when using CHG solution during a  sponge bath (unless your health care provider gives you different instructions): Use your normal soap and shampoo to wash your face and hair. Pour the CHG onto a clean washcloth. Starting at your neck, lather your body down to your toes. Make sure you follow these instructions: If you will be having surgery, pay special attention to the part of your body where you will be having surgery. Scrub this area for at least 1 minute. Do not use CHG on your head or face. If the solution gets into your ears or eyes, rinse them well with water. Avoid your genital area. Avoid any areas of skin that have broken skin, cuts, or scrapes. Scrub your back and under your arms. Make sure to wash skin folds. Let the lather sit on your skin for 1-2 minutes or as long as told by your health care provider. Using a different clean, wet washcloth, thoroughly rinse your entire body. Make sure that all body creases and crevices are rinsed well. Dry off with a clean towel. Do not put any substances on your body afterward--such as powder, lotion, or perfume--unless you are told to do so by your health care provider. Only use lotions that are recommended by the manufacturer. Put on clean clothes or pajamas. If it is the night before your surgery, sleep in clean sheets. How to use CHG prepackaged cloths Only use CHG cloths as told by your health care provider, and follow the instructions on the label. Use the CHG cloth on clean, dry skin. Do not use the CHG cloth on your head or face unless your health care provider tells you to. When washing with the CHG cloth: Avoid your genital area. Avoid any areas of skin that have broken skin, cuts, or scrapes. Before surgery Follow these steps when using a CHG cloth to clean before surgery (unless your health care provider gives you different instructions): Using the CHG cloth, vigorously scrub the part of your body where you will be having surgery. Scrub using a back-and-forth motion  for 3 minutes. The area on your body should be completely wet with CHG when you are done scrubbing. Do not rinse. Discard the cloth and let the area air-dry. Do not put any substances on the area afterward, such as powder, lotion, or perfume. Put on clean clothes or pajamas. If it is the night before your surgery, sleep in clean sheets.  For general bathing Follow these steps when using CHG cloths for general bathing (unless your health care provider gives you different instructions). Use a separate CHG cloth for each area of your body. Make sure you wash between any folds of skin and between your fingers and toes. Wash your body in the following order, switching to a new cloth after each step: The front of your neck, shoulders, and chest. Both of your arms, under your arms, and your hands. Your stomach and groin area, avoiding the genitals. Your right leg and foot. Your left leg and foot. The back of your neck, your back, and your buttocks. Do not rinse. Discard the cloth and let the area air-dry. Do not put any substances on your body afterward--such as powder, lotion, or perfume--unless you are told to do so by your health care provider. Only use lotions that are recommended by the manufacturer. Put on clean clothes or pajamas. Contact a health care provider if: Your skin gets irritated after scrubbing. You have questions about using your  solution or cloth. Get help right away if: Your eyes become very red or swollen. Your eyes itch badly. Your skin itches badly and is red or swollen. Your hearing changes. You have trouble seeing. You have swelling or tingling in your mouth or throat. You have trouble breathing. You swallow any chlorhexidine. Summary Chlorhexidine gluconate (CHG) is a germ-killing (antiseptic) solution that is used to clean the skin. Cleaning your skin with CHG may help to lower your risk for infection. You may be given CHG to use for bathing. It may be in a bottle  or in a prepackaged cloth to use on your skin. Carefully follow your health care provider's instructions and the instructions on the product label. Do not use CHG if you have a chlorhexidine allergy. Contact your health care provider if your skin gets irritated after scrubbing. This information is not intended to replace advice given to you by your health care provider. Make sure you discuss any questions you have with your healthcare provider. Document Revised: 01/09/2020 Document Reviewed: 02/13/2020 Elsevier Patient Education  2022 [employer].

## 2021-04-14 ENCOUNTER — Other Ambulatory Visit: Payer: Self-pay | Admitting: Obstetrics & Gynecology

## 2021-04-15 ENCOUNTER — Encounter (HOSPITAL_COMMUNITY)
Admission: RE | Admit: 2021-04-15 | Discharge: 2021-04-15 | Disposition: A | Discharge status: Home / Self Care | Payer: 59 | Source: Ambulatory Visit | Attending: Obstetrics & Gynecology | Admitting: Obstetrics & Gynecology

## 2021-04-15 ENCOUNTER — Encounter (HOSPITAL_COMMUNITY): Payer: Self-pay

## 2021-04-15 ENCOUNTER — Other Ambulatory Visit: Payer: Self-pay

## 2021-04-15 DIAGNOSIS — Z01812 Encounter for preprocedural laboratory examination: Secondary | ICD-10-CM | POA: Diagnosis not present

## 2021-04-15 LAB — URINALYSIS, ROUTINE W REFLEX MICROSCOPIC
Bacteria, UA: NONE SEEN
Bilirubin Urine: NEGATIVE
Glucose, UA: NEGATIVE mg/dL
Hgb urine dipstick: NEGATIVE
Ketones, ur: NEGATIVE mg/dL
Leukocytes,Ua: NEGATIVE
Nitrite: NEGATIVE
Protein, ur: 30 mg/dL — AB
Specific Gravity, Urine: 1.024 (ref 1.005–1.030)
pH: 5 (ref 5.0–8.0)

## 2021-04-15 LAB — CBC
HCT: 39 % (ref 36.0–46.0)
Hemoglobin: 13.1 g/dL (ref 12.0–15.0)
MCH: 29.7 pg (ref 26.0–34.0)
MCHC: 33.6 g/dL (ref 30.0–36.0)
MCV: 88.4 fL (ref 80.0–100.0)
Platelets: 234 10*3/uL (ref 150–400)
RBC: 4.41 MIL/uL (ref 3.87–5.11)
RDW: 12.1 % (ref 11.5–15.5)
WBC: 6.2 10*3/uL (ref 4.0–10.5)
nRBC: 0 % (ref 0.0–0.2)

## 2021-04-15 LAB — COMPREHENSIVE METABOLIC PANEL
ALT: 14 U/L (ref 0–44)
AST: 15 U/L (ref 15–41)
Albumin: 4.1 g/dL (ref 3.5–5.0)
Alkaline Phosphatase: 89 U/L (ref 38–126)
Anion gap: 8 (ref 5–15)
BUN: 11 mg/dL (ref 6–20)
CO2: 22 mmol/L (ref 22–32)
Calcium: 8.5 mg/dL — ABNORMAL LOW (ref 8.9–10.3)
Chloride: 106 mmol/L (ref 98–111)
Creatinine, Ser: 0.65 mg/dL (ref 0.44–1.00)
GFR, Estimated: >60 mL/min (ref >60)
Glucose, Bld: 93 mg/dL (ref 70–99)
Potassium: 3.7 mmol/L (ref 3.5–5.1)
Sodium: 136 mmol/L (ref 135–145)
Total Bilirubin: 0.5 mg/dL (ref 0.3–1.2)
Total Protein: 7.8 g/dL (ref 6.5–8.1)

## 2021-04-15 LAB — HCG, QUANTITATIVE, PREGNANCY: hCG, Beta Chain, Quant, S: <1 m[IU]/mL (ref <5)

## 2021-04-15 LAB — RAPID HIV SCREEN (HIV 1/2 AB+AG)
HIV 1/2 Antibodies: NONREACTIVE
HIV-1 P24 Antigen - HIV24: NONREACTIVE

## 2021-04-19 ENCOUNTER — Telehealth: Payer: 59 | Admitting: Obstetrics & Gynecology

## 2021-04-20 ENCOUNTER — Encounter (HOSPITAL_COMMUNITY)
Admission: RE | Disposition: A | Discharge status: Home / Self Care | Payer: Self-pay | Source: Home / Self Care | Attending: Obstetrics & Gynecology

## 2021-04-20 ENCOUNTER — Ambulatory Visit (HOSPITAL_COMMUNITY)
Admission: RE | Admit: 2021-04-20 | Discharge: 2021-04-20 | Disposition: A | Discharge status: Home / Self Care | Payer: 59 | Attending: Obstetrics & Gynecology | Admitting: Obstetrics & Gynecology

## 2021-04-20 ENCOUNTER — Ambulatory Visit (HOSPITAL_COMMUNITY): Payer: 59 | Admitting: Certified Registered Nurse Anesthetist

## 2021-04-20 ENCOUNTER — Encounter (HOSPITAL_COMMUNITY): Payer: Self-pay | Admitting: Obstetrics & Gynecology

## 2021-04-20 DIAGNOSIS — Z30432 Encounter for removal of intrauterine contraceptive device: Secondary | ICD-10-CM | POA: Diagnosis not present

## 2021-04-20 DIAGNOSIS — Z302 Encounter for sterilization: Secondary | ICD-10-CM | POA: Diagnosis not present

## 2021-04-20 HISTORY — PX: IUD REMOVAL: SHX5392

## 2021-04-20 HISTORY — PX: LAPAROSCOPIC BILATERAL SALPINGECTOMY: SHX5889

## 2021-04-20 SURGERY — SALPINGECTOMY, BILATERAL, LAPAROSCOPIC
Anesthesia: General

## 2021-04-20 MED ORDER — ROCURONIUM BROMIDE 100 MG/10ML IV SOLN
INTRAVENOUS | Status: DC | PRN
  Administered 2021-04-20: 40 mg via INTRAVENOUS

## 2021-04-20 MED ORDER — HYDROMORPHONE HCL 1 MG/ML IJ SOLN
0.2500 mg | INTRAMUSCULAR | Status: DC | PRN
  Administered 2021-04-20: 0.5 mg via INTRAVENOUS
  Filled 2021-04-20: qty 0.5

## 2021-04-20 MED ORDER — KETOROLAC TROMETHAMINE 30 MG/ML IJ SOLN: 30.0000 mg | Freq: Once | INTRAMUSCULAR | Status: AC

## 2021-04-20 MED ORDER — ONDANSETRON 8 MG PO TBDP: 8.0000 mg | ORAL_TABLET | Freq: Three times a day (TID) | ORAL | 0 refills | Status: DC | PRN

## 2021-04-20 MED ORDER — LACTATED RINGERS IV SOLN
INTRAVENOUS | Status: DC
  Administered 2021-04-20: 1000 mL via INTRAVENOUS

## 2021-04-20 MED ORDER — MIDAZOLAM HCL 2 MG/2ML IJ SOLN
INTRAMUSCULAR | Status: DC | PRN
  Administered 2021-04-20: 2 mg via INTRAVENOUS

## 2021-04-20 MED ORDER — SUGAMMADEX SODIUM 500 MG/5ML IV SOLN
INTRAVENOUS | Status: DC | PRN
  Administered 2021-04-20: 200 mg via INTRAVENOUS

## 2021-04-20 MED ORDER — SODIUM CHLORIDE 0.9 % IR SOLN
Status: DC | PRN
  Administered 2021-04-20: 1000 mL

## 2021-04-20 MED ORDER — CEFAZOLIN SODIUM-DEXTROSE 2-4 GM/100ML-% IV SOLN
2.0000 g | INTRAVENOUS | Status: AC
  Administered 2021-04-20: 2 g via INTRAVENOUS

## 2021-04-20 MED ORDER — CEFAZOLIN SODIUM-DEXTROSE 2-4 GM/100ML-% IV SOLN
INTRAVENOUS | Status: AC
  Filled 2021-04-20: qty 100

## 2021-04-20 MED ORDER — ONDANSETRON HCL 4 MG/2ML IJ SOLN: 4.0000 mg | Freq: Once | INTRAMUSCULAR | Status: DC | PRN

## 2021-04-20 MED ORDER — BUPIVACAINE LIPOSOME 1.3 % IJ SUSP
20.0000 mL | Freq: Once | INTRAMUSCULAR | Status: DC
  Filled 2021-04-20: qty 20

## 2021-04-20 MED ORDER — KETOROLAC TROMETHAMINE 10 MG PO TABS: 10.0000 mg | ORAL_TABLET | Freq: Three times a day (TID) | ORAL | 0 refills | Status: DC | PRN

## 2021-04-20 MED ORDER — KETOROLAC TROMETHAMINE 30 MG/ML IJ SOLN: 30.0000 mg | Freq: Once | INTRAMUSCULAR | Status: DC | PRN

## 2021-04-20 MED ORDER — MIDAZOLAM HCL 2 MG/2ML IJ SOLN
INTRAMUSCULAR | Status: AC
  Filled 2021-04-20: qty 2

## 2021-04-20 MED ORDER — KETOROLAC TROMETHAMINE 30 MG/ML IJ SOLN
INTRAMUSCULAR | Status: AC
  Administered 2021-04-20: 30 mg
  Filled 2021-04-20: qty 1

## 2021-04-20 MED ORDER — ROCURONIUM BROMIDE 10 MG/ML (PF) SYRINGE
PREFILLED_SYRINGE | INTRAVENOUS | Status: AC
  Filled 2021-04-20: qty 10

## 2021-04-20 MED ORDER — BUPIVACAINE LIPOSOME 1.3 % IJ SUSP
INTRAMUSCULAR | Status: DC | PRN
  Administered 2021-04-20: 20 mL

## 2021-04-20 MED ORDER — ONDANSETRON HCL 4 MG/2ML IJ SOLN
INTRAMUSCULAR | Status: AC
  Filled 2021-04-20: qty 2

## 2021-04-20 MED ORDER — HYDROCODONE-ACETAMINOPHEN 7.5-325 MG PO TABS
1.0000 | ORAL_TABLET | Freq: Once | ORAL | Status: DC | PRN
Start: 2021-04-20 — End: 2021-04-20

## 2021-04-20 MED ORDER — CHLORHEXIDINE GLUCONATE 0.12 % MT SOLN
15.0000 mL | Freq: Once | OROMUCOSAL | Status: AC
  Administered 2021-04-20: 15 mL via OROMUCOSAL

## 2021-04-20 MED ORDER — FENTANYL CITRATE (PF) 250 MCG/5ML IJ SOLN
INTRAMUSCULAR | Status: AC
  Filled 2021-04-20: qty 5

## 2021-04-20 MED ORDER — PROPOFOL 10 MG/ML IV BOLUS
INTRAVENOUS | Status: DC | PRN
Start: 2021-04-20 — End: 2021-04-20
  Administered 2021-04-20: 50 mg via INTRAVENOUS
  Administered 2021-04-20: 150 mg via INTRAVENOUS

## 2021-04-20 MED ORDER — POVIDONE-IODINE 10 % EX SWAB: 2.0000 "application " | Freq: Once | CUTANEOUS | Status: DC

## 2021-04-20 MED ORDER — HYDROCODONE-ACETAMINOPHEN 5-325 MG PO TABS
1.0000 | ORAL_TABLET | Freq: Four times a day (QID) | ORAL | 0 refills | Status: DC | PRN
Start: 2021-04-20 — End: 2021-05-02

## 2021-04-20 MED ORDER — BUPIVACAINE LIPOSOME 1.3 % IJ SUSP
INTRAMUSCULAR | Status: AC
  Filled 2021-04-20: qty 20

## 2021-04-20 MED ORDER — FENTANYL CITRATE (PF) 100 MCG/2ML IJ SOLN
INTRAMUSCULAR | Status: DC | PRN
  Administered 2021-04-20: 50 ug via INTRAVENOUS
  Administered 2021-04-20 (×2): 100 ug via INTRAVENOUS

## 2021-04-20 MED ORDER — MEPERIDINE HCL 50 MG/ML IJ SOLN: 6.2500 mg | INTRAMUSCULAR | Status: DC | PRN

## 2021-04-20 MED ORDER — ONDANSETRON HCL 4 MG/2ML IJ SOLN
INTRAMUSCULAR | Status: DC | PRN
  Administered 2021-04-20: 4 mg via INTRAVENOUS

## 2021-04-20 MED ORDER — PROPOFOL 10 MG/ML IV BOLUS
INTRAVENOUS | Status: AC
  Filled 2021-04-20: qty 40

## 2021-04-20 SURGICAL SUPPLY — 42 items
ADH SKN CLS APL DERMABOND .7 (GAUZE/BANDAGES/DRESSINGS) ×2
APL SWBSTK 6 STRL LF DISP (MISCELLANEOUS) ×2
APPLICATOR COTTON TIP 6 STRL (MISCELLANEOUS) ×2 IMPLANT
APPLICATOR COTTON TIP 6IN STRL (MISCELLANEOUS) ×3
BAG HAMPER (MISCELLANEOUS) ×3 IMPLANT
BLADE SURG SZ11 CARB STEEL (BLADE) ×3 IMPLANT
CATH ROBINSON RED A/P 16FR (CATHETERS) ×3 IMPLANT
CLOTH BEACON ORANGE TIMEOUT ST (SAFETY) ×3 IMPLANT
COVER LIGHT HANDLE STERIS (MISCELLANEOUS) ×6 IMPLANT
DERMABOND ADVANCED (GAUZE/BANDAGES/DRESSINGS) ×1
DERMABOND ADVANCED .7 DNX12 (GAUZE/BANDAGES/DRESSINGS) ×2 IMPLANT
ELECT REM PT RETURN 9FT ADLT (ELECTROSURGICAL) ×3
ELECTRODE REM PT RTRN 9FT ADLT (ELECTROSURGICAL) ×2 IMPLANT
GAUZE 4X4 16PLY ~~LOC~~+RFID DBL (SPONGE) ×6 IMPLANT
GLOVE ECLIPSE 8.0 STRL XLNG CF (GLOVE) ×3 IMPLANT
GLOVE SRG 8 PF TXTR STRL LF DI (GLOVE) ×2 IMPLANT
GLOVE SURG UNDER POLY LF SZ7 (GLOVE) ×9 IMPLANT
GLOVE SURG UNDER POLY LF SZ8 (GLOVE) ×3
GOWN STRL REUS W/TWL LRG LVL3 (GOWN DISPOSABLE) ×3 IMPLANT
GOWN STRL REUS W/TWL XL LVL3 (GOWN DISPOSABLE) ×3 IMPLANT
INST SET LAPROSCOPIC GYN AP (KITS) ×3 IMPLANT
KIT TURNOVER CYSTO (KITS) ×3 IMPLANT
MANIFOLD NEPTUNE II (INSTRUMENTS) ×3 IMPLANT
NEEDLE HYPO 18GX1.5 BLUNT FILL (NEEDLE) ×3 IMPLANT
NEEDLE HYPO 21X1.5 SAFETY (NEEDLE) ×3 IMPLANT
NEEDLE INSUFFLATION 14GA 120MM (NEEDLE) ×3 IMPLANT
NS IRRIG 1000ML POUR BTL (IV SOLUTION) ×3 IMPLANT
PACK PERI GYN (CUSTOM PROCEDURE TRAY) ×3 IMPLANT
PAD ARMBOARD 7.5X6 YLW CONV (MISCELLANEOUS) ×3 IMPLANT
SET BASIN LINEN APH (SET/KITS/TRAYS/PACK) ×3 IMPLANT
SET TUBE SMOKE EVAC HIGH FLOW (TUBING) ×3 IMPLANT
SHEARS HARMONIC ACE PLUS 36CM (ENDOMECHANICALS) ×3 IMPLANT
SLEEVE ENDOPATH XCEL 5M (ENDOMECHANICALS) ×3 IMPLANT
SOL ANTI FOG 6CC (MISCELLANEOUS) ×2 IMPLANT
SOLUTION ANTI FOG 6CC (MISCELLANEOUS) ×1
SUT VICRYL 0 UR6 27IN ABS (SUTURE) ×3 IMPLANT
SUT VICRYL AB 3-0 FS1 BRD 27IN (SUTURE) ×6 IMPLANT
SYR 10ML LL (SYRINGE) ×3 IMPLANT
SYR 20ML LL LF (SYRINGE) ×6 IMPLANT
TROCAR ENDO BLADELESS 11MM (ENDOMECHANICALS) ×3 IMPLANT
TROCAR XCEL NON-BLD 5MMX100MML (ENDOMECHANICALS) ×3 IMPLANT
WARMER LAPAROSCOPE (MISCELLANEOUS) ×3 IMPLANT

## 2021-04-20 NOTE — Anesthesia Procedure Notes (Signed)
Procedure Name: Intubation Date/Time: 04/20/2021 8:58 AM Performed by: Vista Deck, CRNA Pre-anesthesia Checklist: Patient identified, Patient being monitored, Timeout performed, Emergency Drugs available and Suction available Patient Re-evaluated:Patient Re-evaluated prior to induction Oxygen Delivery Method: Circle System Utilized Preoxygenation: Pre-oxygenation with 100% oxygen Induction Type: IV induction Ventilation: Mask ventilation without difficulty Laryngoscope Size: Mac and 3 Grade View: Grade II Tube type: Oral Tube size: 7.0 mm Number of attempts: 1 Airway Equipment and Method: stylet Placement Confirmation: ETT inserted through vocal cords under direct vision, positive ETCO2 and breath sounds checked- equal and bilateral Secured at: 21 cm Tube secured with: Tape Dental Injury: Teeth and Oropharynx as per pre-operative assessment

## 2021-04-20 NOTE — Op Note (Addendum)
Preoperative Diagnosis:  1.  Multiparous female desires permanent sterilization                                          2.  Elects to have bilateral salpingectomy for ovarian cancer prophylaxis  Postoperative Diagnosis:  Same as above  Procedure:  Laparoscopic Bilateral Salpingectomy for the purpose of permanent sterilization and removal of IUD  Surgeon:  Rockne Coons MD  Anaesthesia: general  Findings:  Patient had normal pelvic anatomy and no intraperitoneal abnormalities.  Description of Operation:  Patient was taken to the OR and placed into supine position where she underwent general anaesthesia.   She was placed in the dorsal lithotomy position and prepped and draped in the usual sterile fashion.   An incision was made in the umbilicus and dissection taken down to the rectus fascia. A Veres needle was used to insufflate the periotneal cavity. An 11 mm non bladed video laparoscope trocar was then placed under direct visualization without difficulty.   The above noted findings were observed.   Two additional 5 mm non bladed trocars were placed in the right and left lower quadrants under direct visualization without difficulty.   The Harmonic scalpel was employed and a salpingectomy of both the right and left tubes was performed.   The tubes were removed from the peritoneal cavity and sent to pathology.   There was good hemostasis bilaterally.   The fascia, peritoneum and subcutaneous tissue were closed using 0 vicryl.   All 3 skin incisions were closed using 3-0 vicryl in a subcuticular fashion.  Exparel 266 mg 20 cc was injected in the 3 incisional/trocar sites.   Mirena IUD was removed without difficulty  The patient was awakened from anaesthesia and taken to the PACU with all counts being correct x 3.   The patient received  2 gram of ancef andToradol 30 mg IV preoperatively.  Amanda Perez 04/20/2021 10:02 AM

## 2021-04-20 NOTE — Transfer of Care (Signed)
Immediate Anesthesia Transfer of Care Note  Patient: Amanda Perez  Procedure(s) Performed: LAPAROSCOPIC BILATERAL SALPINGECTOMY (Bilateral) INTRAUTERINE DEVICE (IUD) REMOVAL  Patient Location: PACU  Anesthesia Type:General  Level of Consciousness: awake and patient cooperative  Airway & Oxygen Therapy: Patient Spontanous Breathing  Post-op Assessment: Report given to RN and Post -op Vital signs reviewed and stable  Post vital signs: Reviewed and stable  Last Vitals:  Vitals Value Taken Time  BP 121/70 04/20/21 1004  Temp 98.1   Pulse 88 04/20/21 1006  Resp 10 04/20/21 1006  SpO2 98 % 04/20/21 1006  Vitals shown include unvalidated device data.  Last Pain:  Vitals:   04/20/21 0728  TempSrc: Oral  PainSc: 0-No pain      Patients Stated Pain Goal: 7 (04/20/21 0728)  Complications: No notable events documented.

## 2021-04-20 NOTE — Anesthesia Postprocedure Evaluation (Signed)
Anesthesia Post Note  Patient: Amanda Perez  Procedure(s) Performed: LAPAROSCOPIC BILATERAL SALPINGECTOMY (Bilateral) INTRAUTERINE DEVICE (IUD) REMOVAL  Patient location during evaluation: PACU Anesthesia Type: General Level of consciousness: awake and alert Pain management: pain level controlled Vital Signs Assessment: post-procedure vital signs reviewed and stable Respiratory status: spontaneous breathing, nonlabored ventilation, respiratory function stable and patient connected to nasal cannula oxygen Cardiovascular status: blood pressure returned to baseline and stable Postop Assessment: no apparent nausea or vomiting Anesthetic complications: no   No notable events documented.   Last Vitals:  Vitals:   04/20/21 1200 04/20/21 1213  BP: 116/74 125/85  Pulse: 81 71  Resp: 10 16  Temp: 36.8 C 36.7 C  SpO2: 99% 100%    Last Pain:  Vitals:   04/20/21 1213  TempSrc: Oral  PainSc: 3                  Shona Needles

## 2021-04-20 NOTE — H&P (Signed)
Preoperative History and Physical  Amanda Perez is a 30 y.o. [phone] with Patient's last menstrual period was 03/22/2021 (approximate). admitted for a laparoscopic bilateral salpingectomy for permanent sterilization, pt opts for salpingectomy for ovarian cancer prophylaxis and near zero failure rate.    PMH:    Past Medical History:  Diagnosis Date   HSV-2 (herpes simplex virus 2) infection    Other disorder of menstruation and other abnormal bleeding from female genital tract 07/10/2013   Bleeding daily since IUD inserted   Pregnant 12/29/2014   Pregnant     PSH:     Past Surgical History:  Procedure Laterality Date   DILATION AND EVACUATION N/A 08/15/2015   Procedure: DILATATION and curretage, repair of laceration;  Surgeon: Tereso Newcomer, MD;  Location: [facility];  Service: Gynecology;  Laterality: N/A;    POb/GynH:      OB History     Gravida  3   Para  3   Term  2   Preterm  1   AB      Living  4      SAB      IAB      Ectopic      Multiple  1   Live Births  4           SH:   Social History   Tobacco Use   Smoking status: Never   Smokeless tobacco: Never  Vaping Use   Vaping Use: Never used  Substance Use Topics   Alcohol use: Not Currently   Drug use: No    FH:    Family History  Problem Relation Age of Onset   Hypertension Mother    Seizures Mother    Hypertension Paternal Grandmother    Hypertension Maternal Grandmother      Allergies: No Known Allergies  Medications:       Current Facility-Administered Medications:    bupivacaine liposome (EXPAREL) 1.3 % injection 266 mg, 20 mL, Infiltration, Once, Prabhjot Piscitello, Amaryllis Dyke, MD   lactated ringers infusion, , Intravenous, Continuous, Shona Needles, MD, Last Rate: 10 mL/hr at 04/20/21 0825, Continued from Pre-op at 04/20/21 0825   povidone-iodine 10 % swab 2 application, 2 application, Topical, Once, Orvie Caradine, Amaryllis Dyke, MD  Facility-Administered Medications Ordered in Other Encounters:     midazolam (VERSED) injection, , Intravenous, Anesthesia Intra-op, Franco Nones, CRNA, 2 mg at 04/20/21 0825  Review of Systems:   Review of Systems  Constitutional: Negative for fever, chills, weight loss, malaise/fatigue and diaphoresis.  HENT: Negative for hearing loss, ear pain, nosebleeds, congestion, sore throat, neck pain, tinnitus and ear discharge.   Eyes: Negative for blurred vision, double vision, photophobia, pain, discharge and redness.  Respiratory: Negative for cough, hemoptysis, sputum production, shortness of breath, wheezing and stridor.   Cardiovascular: Negative for chest pain, palpitations, orthopnea, claudication, leg swelling and PND.  Gastrointestinal: Positive for abdominal pain. Negative for heartburn, nausea, vomiting, diarrhea, constipation, blood in stool and melena.  Genitourinary: Negative for dysuria, urgency, frequency, hematuria and flank pain.  Musculoskeletal: Negative for myalgias, back pain, joint pain and falls.  Skin: Negative for itching and rash.  Neurological: Negative for dizziness, tingling, tremors, sensory change, speech change, focal weakness, seizures, loss of consciousness, weakness and headaches.  Endo/Heme/Allergies: Negative for environmental allergies and polydipsia. Does not bruise/bleed easily.  Psychiatric/Behavioral: Negative for depression, suicidal ideas, hallucinations, memory loss and substance abuse. The patient is not nervous/anxious and does not have insomnia.  PHYSICAL EXAM:  Blood pressure 115/83, pulse 83, temperature 98.8 F (37.1 C), temperature source Oral, resp. rate 13, last menstrual period 03/22/2021, SpO2 98 %, currently breastfeeding.    Vitals reviewed. Constitutional: She is oriented to person, place, and time. She appears well-developed and well-nourished.  HENT:  Head: Normocephalic and atraumatic.  Right Ear: External ear normal.  Left Ear: External ear normal.  Nose: Nose normal.   Mouth/Throat: Oropharynx is clear and moist.  Eyes: Conjunctivae and EOM are normal. Pupils are equal, round, and reactive to light. Right eye exhibits no discharge. Left eye exhibits no discharge. No scleral icterus.  Neck: Normal range of motion. Neck supple. No tracheal deviation present. No thyromegaly present.  Cardiovascular: Normal rate, regular rhythm, normal heart sounds and intact distal pulses.  Exam reveals no gallop and no friction rub.   No murmur heard. Respiratory: Effort normal and breath sounds normal. No respiratory distress. She has no wheezes. She has no rales. She exhibits no tenderness.  GI: Soft. Bowel sounds are normal. She exhibits no distension and no mass. There is tenderness. There is no rebound and no guarding.  Genitourinary:       Vulva is normal without lesions Vagina is pink moist without discharge Cervix normal in appearance and pap is normal Uterus is normal size, contour, position, consistency, mobility, non-tender Adnexa is negative with normal sized ovaries by sonogram  Musculoskeletal: Normal range of motion. She exhibits no edema and no tenderness.  Neurological: She is alert and oriented to person, place, and time. She has normal reflexes. She displays normal reflexes. No cranial nerve deficit. She exhibits normal muscle tone. Coordination normal.  Skin: Skin is warm and dry. No rash noted. No erythema. No pallor.  Psychiatric: She has a normal mood and affect. Her behavior is normal. Judgment and thought content normal.    Labs: Results for orders placed or performed during the hospital encounter of 04/15/21 (from the past 336 hour(s))  CBC   Collection Time: 04/15/21  8:16 AM  Result Value Ref Range   WBC 6.2 4.0 - 10.5 K/uL   RBC 4.41 3.87 - 5.11 MIL/uL   Hemoglobin 13.1 12.0 - 15.0 g/dL   HCT 40.9 81.1 - 91.4 %   MCV 88.4 80.0 - 100.0 fL   MCH 29.7 26.0 - 34.0 pg   MCHC 33.6 30.0 - 36.0 g/dL   RDW 78.2 95.6 - 21.3 %   Platelets 234 150 -  400 K/uL   nRBC 0.0 0.0 - 0.2 %  Comprehensive metabolic panel   Collection Time: 04/15/21  8:16 AM  Result Value Ref Range   Sodium 136 135 - 145 mmol/L   Potassium 3.7 3.5 - 5.1 mmol/L   Chloride 106 98 - 111 mmol/L   CO2 22 22 - 32 mmol/L   Glucose, Bld 93 70 - 99 mg/dL   BUN 11 6 - 20 mg/dL   Creatinine, Ser 0.86 0.44 - 1.00 mg/dL   Calcium 8.5 (L) 8.9 - 10.3 mg/dL   Total Protein 7.8 6.5 - 8.1 g/dL   Albumin 4.1 3.5 - 5.0 g/dL   AST 15 15 - 41 U/L   ALT 14 0 - 44 U/L   Alkaline Phosphatase 89 38 - 126 U/L   Total Bilirubin 0.5 0.3 - 1.2 mg/dL   GFR, Estimated >57 >84 mL/min   Anion gap 8 5 - 15  hCG, quantitative, pregnancy   Collection Time: 04/15/21  8:16 AM  Result Value Ref Range  hCG, Beta Chain, Quant, S <1 <5 mIU/mL  Rapid HIV screen (HIV 1/2 Ab+Ag)   Collection Time: 04/15/21  8:16 AM  Result Value Ref Range   HIV-1 P24 Antigen - HIV24 NON REACTIVE NON REACTIVE   HIV 1/2 Antibodies NON REACTIVE NON REACTIVE   Interpretation (HIV Ag Ab)      A non reactive test result means that HIV 1 or HIV 2 antibodies and HIV 1 p24 antigen were not detected in the specimen.  Urinalysis, Routine w reflex microscopic Urine, Clean Catch   Collection Time: 04/15/21  8:16 AM  Result Value Ref Range   Color, Urine YELLOW YELLOW   APPearance HAZY (A) CLEAR   Specific Gravity, Urine 1.024 1.005 - 1.030   pH 5.0 5.0 - 8.0   Glucose, UA NEGATIVE NEGATIVE mg/dL   Hgb urine dipstick NEGATIVE NEGATIVE   Bilirubin Urine NEGATIVE NEGATIVE   Ketones, ur NEGATIVE NEGATIVE mg/dL   Protein, ur 30 (A) NEGATIVE mg/dL   Nitrite NEGATIVE NEGATIVE   Leukocytes,Ua NEGATIVE NEGATIVE   RBC / HPF 6-10 0 - 5 RBC/hpf   WBC, UA 0-5 0 - 5 WBC/hpf   Bacteria, UA NONE SEEN NONE SEEN   Squamous Epithelial / LPF 6-10 0 - 5   Mucus PRESENT     EKG: No orders found for this or any previous visit.  Imaging Studies: No results found.    Assessment: Multiparous female desires permanent  sterilization Needs IUD removal  Plan: As above  Lazaro Arms 04/20/2021 8:41 AM

## 2021-04-20 NOTE — Anesthesia Preprocedure Evaluation (Signed)
Anesthesia Evaluation  Patient identified by MRN, date of birth, ID band Patient awake    Reviewed: Allergy & Precautions, NPO status , Patient's Chart, lab work & pertinent test results  Airway Mallampati: II  TM Distance: >3 FB Neck ROM: Full    Dental no notable dental hx.    Pulmonary neg pulmonary ROS,    Pulmonary exam normal breath sounds clear to auscultation       Cardiovascular negative cardio ROS Normal cardiovascular exam Rhythm:Regular Rate:Normal     Neuro/Psych negative neurological ROS  negative psych ROS   GI/Hepatic negative GI ROS, Neg liver ROS,   Endo/Other  negative endocrine ROS  Renal/GU negative Renal ROS  negative genitourinary   Musculoskeletal negative musculoskeletal ROS (+)   Abdominal   Peds negative pediatric ROS (+)  Hematology HSV-2   Anesthesia Other Findings BMI 34  Reproductive/Obstetrics negative OB ROS                             Anesthesia Physical Anesthesia Plan  ASA: 2  Anesthesia Plan: General   Post-op Pain Management:    Induction: Intravenous  PONV Risk Score and Plan:   Airway Management Planned: Oral ETT  Additional Equipment:   Intra-op Plan:   Post-operative Plan: Extubation in OR  Informed Consent: I have reviewed the patients History and Physical, chart, labs and discussed the procedure including the risks, benefits and alternatives for the proposed anesthesia with the patient or authorized representative who has indicated his/her understanding and acceptance.     Dental advisory given  Plan Discussed with: CRNA  Anesthesia Plan Comments:         Anesthesia Quick Evaluation

## 2021-04-21 ENCOUNTER — Encounter (HOSPITAL_COMMUNITY): Payer: Self-pay | Admitting: Obstetrics & Gynecology

## 2021-04-21 ENCOUNTER — Encounter: Payer: Self-pay | Admitting: Obstetrics & Gynecology

## 2021-04-21 LAB — SURGICAL PATHOLOGY

## 2021-05-02 ENCOUNTER — Telehealth (INDEPENDENT_AMBULATORY_CARE_PROVIDER_SITE_OTHER): Payer: 59 | Admitting: Obstetrics & Gynecology

## 2021-05-02 ENCOUNTER — Encounter: Payer: Self-pay | Admitting: Obstetrics & Gynecology

## 2021-05-02 VITALS — Ht 64.0 in

## 2021-05-02 DIAGNOSIS — Z09 Encounter for follow-up examination after completed treatment for conditions other than malignant neoplasm: Secondary | ICD-10-CM | POA: Diagnosis not present

## 2021-05-02 DIAGNOSIS — Z9889 Other specified postprocedural states: Secondary | ICD-10-CM

## 2021-05-02 NOTE — Progress Notes (Signed)
  Mychart video visit Pt is home I am in my office  time  HPI: Patient returns for routine postoperative follow-up having undergone laparoscopic bilateral salpingectomy on 04/20/21.  The patient's immediate postoperative recovery has been unremarkable. Since hospital discharge the patient reports no problems.   Current Outpatient Medications: ketorolac (TORADOL) 10 MG tablet, Take 1 tablet (10 mg total) by mouth every 8 (eight) hours as needed., Disp: 15 tablet, Rfl: 0 Multiple Vitamin (MULTIVITAMIN WITH MINERALS) TABS tablet, Take 1 tablet by mouth daily., Disp: , Rfl:  nystatin cream (MYCOSTATIN), Apply 1 application topically 3 (three) times daily., Disp: 30 g, Rfl: 1 triamcinolone ointment (KENALOG) 0.1 %, Apply 1 application topically 2 (two) times daily as needed (eczema)., Disp: , Rfl:   No current facility-administered medications for this visit.    Height 5\' 4"  (1.626 m), currently breastfeeding.  Physical Exam: Incision clean dry intact by pt report  Diagnostic Tests:   Pathology: benign  Impression:    ICD-10-CM   1. Post-operative state, bilateral salpingectomy for sterilization  Z98.890        Plan: No orders of the defined types were placed in this encounter.    Follow up: No follow-ups on file.   , MD

## 2021-09-03 ENCOUNTER — Emergency Department (HOSPITAL_COMMUNITY): Admission: EM | Admit: 2021-09-03 | Discharge status: Absent without leave | Payer: 59 | Source: Home / Self Care

## 2021-09-03 ENCOUNTER — Emergency Department (HOSPITAL_COMMUNITY): Payer: 59

## 2021-09-03 ENCOUNTER — Encounter (HOSPITAL_COMMUNITY): Payer: Self-pay

## 2021-09-03 DIAGNOSIS — Y92410 Unspecified street and highway as the place of occurrence of the external cause: Secondary | ICD-10-CM | POA: Insufficient documentation

## 2021-09-03 DIAGNOSIS — M79651 Pain in right thigh: Secondary | ICD-10-CM | POA: Insufficient documentation

## 2021-09-03 DIAGNOSIS — S7012XA Contusion of left thigh, initial encounter: Secondary | ICD-10-CM | POA: Insufficient documentation

## 2021-09-03 DIAGNOSIS — S79922A Unspecified injury of left thigh, initial encounter: Secondary | ICD-10-CM | POA: Diagnosis present
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
## 2021-09-04 ENCOUNTER — Emergency Department (HOSPITAL_COMMUNITY)
Admission: EM | Admit: 2021-09-04 | Discharge: 2021-09-04 | Disposition: A | Discharge status: Home / Self Care | Payer: 59 | Attending: Emergency Medicine | Admitting: Emergency Medicine

## 2021-09-04 ENCOUNTER — Encounter (HOSPITAL_COMMUNITY): Payer: Self-pay

## 2021-09-04 DIAGNOSIS — Y92410 Unspecified street and highway as the place of occurrence of the external cause: Secondary | ICD-10-CM | POA: Insufficient documentation

## 2021-09-04 DIAGNOSIS — S7012XA Contusion of left thigh, initial encounter: Secondary | ICD-10-CM

## 2021-09-04 DIAGNOSIS — Z87891 Personal history of nicotine dependence: Secondary | ICD-10-CM | POA: Insufficient documentation

## 2021-09-04 MED ORDER — ACETAMINOPHEN 325 MG PO TABS
650.0000 mg | ORAL_TABLET | Freq: Once | ORAL | Status: AC
  Administered 2021-09-04: 04:00:00 650 mg via ORAL
  Filled 2021-09-04: qty 2

## 2021-09-04 MED ORDER — IBUPROFEN 200 MG PO TABS
400.0000 mg | ORAL_TABLET | Freq: Once | ORAL | Status: AC
  Administered 2021-09-04: 04:00:00 400 mg via ORAL
  Filled 2021-09-04: qty 2

## 2021-09-04 NOTE — Discharge Instructions (Signed)
Apply ice for 30 minutes at a time, 4 times a day.  Take ibuprofen (Advil, Motrin) or naproxen (Aleve) as needed for pain.  For additional pain relief, add acetaminophen.  Please note that combining acetaminophen with either ibuprofen or naproxen gives you better pain relief than either medication by itself.

## 2021-09-04 NOTE — ED Notes (Signed)
Negative Femur Xray. Patient was registered under her sisters chart. IT contacted.
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
## 2022-01-05 DIAGNOSIS — L2084 Intrinsic (allergic) eczema: Secondary | ICD-10-CM | POA: Diagnosis not present

## 2022-01-05 DIAGNOSIS — L700 Acne vulgaris: Secondary | ICD-10-CM | POA: Diagnosis not present

## 2022-01-05 DIAGNOSIS — L219 Seborrheic dermatitis, unspecified: Secondary | ICD-10-CM | POA: Diagnosis not present
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
## 2022-08-29 ENCOUNTER — Other Ambulatory Visit (HOSPITAL_COMMUNITY)
Admission: RE | Admit: 2022-08-29 | Discharge: 2022-08-29 | Disposition: A | Discharge status: Home / Self Care | Payer: Medicaid Other | Source: Ambulatory Visit | Attending: Obstetrics & Gynecology | Admitting: Obstetrics & Gynecology

## 2022-08-29 ENCOUNTER — Ambulatory Visit: Payer: Medicaid Other | Admitting: Obstetrics & Gynecology

## 2022-08-29 ENCOUNTER — Encounter: Payer: Self-pay | Admitting: Obstetrics & Gynecology

## 2022-08-29 VITALS — BP 129/77 | HR 87 | Wt 212.0 lb

## 2022-08-29 DIAGNOSIS — N760 Acute vaginitis: Secondary | ICD-10-CM

## 2022-08-29 DIAGNOSIS — N898 Other specified noninflammatory disorders of vagina: Secondary | ICD-10-CM

## 2022-08-29 DIAGNOSIS — B9689 Other specified bacterial agents as the cause of diseases classified elsewhere: Secondary | ICD-10-CM | POA: Diagnosis not present

## 2022-08-29 MED ORDER — METRONIDAZOLE 500 MG PO TABS: 500.0000 mg | ORAL_TABLET | Freq: Two times a day (BID) | ORAL | 0 refills | Status: DC

## 2022-08-29 NOTE — Progress Notes (Signed)
       Chief Complaint  Patient presents with   vaginal burning, discomfort    Started Monistat last night but not helping   bleeding with intercourse    Blood pressure 129/77, pulse 87, weight 212 lb (96.2 kg), not currently breastfeeding.  31 y.o. [id number] No LMP recorded. The current method of family planning is none.  Subjective Vaginal discharge for 1weeks Itching no Irritation yes Odor no Similar to previous no  Previous treatment took monistat last night which is a bit obscuring  Objective Vulva:  normal appearing vulva with no masses, tenderness or lesions Vagina:  normal mucosa, thin grey discharge, lots of monistat cream in place Cervix:  no cervical motion tenderness and no lesions Uterus:   Adnexa: ovaries:,       Pertinent ROS No antibioitcs recently  Labs or studies Wet Prep:   A sample of vaginal discharge was obtained from the posterior fornix using a cotton swab. 2 drops of saline were placed on a slide and the cotton swab was immersed in the saline. Microscopic evaluation was performed and results were as follows:  Negative  for yeast  Positive for clue cells , consistent with Bacterial vaginosis Negative for trichomonas  Normal WBC population   Whiff test: Negative     Impression Diagnoses this Encounter::   ICD-10-CM   1. BV (bacterial vaginosis)  N76.0    B96.89    wet prep is obscured by monistat cream no trichomonas seen, some clue cells swab done, cover with flagyl 500 BID x 7d    2. Vaginal irritation  N89.8 Cervicovaginal ancillary only( [place])     Do monistat 3 x 2 cycles Established relevant diagnosis(es):   Plan/Recommendations: Meds ordered this encounter  Medications   metroNIDAZOLE (FLAGYL) 500 MG tablet    Sig: Take 1 tablet (500 mg total) by mouth 2 (two) times daily.    Dispense:  14 tablet    Refill:  0    Labs or Scans Ordered: No orders of the defined types were placed in this  encounter.   Management:: As above  Follow up Return if symptoms worsen or fail to improve.    All questions were answered.

## 2022-08-30 LAB — CERVICOVAGINAL ANCILLARY ONLY
Bacterial Vaginitis (gardnerella): POSITIVE — AB
Candida Glabrata: NEGATIVE
Candida Vaginitis: NEGATIVE
Chlamydia: NEGATIVE
Comment: NEGATIVE
Comment: NEGATIVE
Comment: NEGATIVE
Comment: NEGATIVE
Comment: NEGATIVE
Comment: NORMAL
Neisseria Gonorrhea: NEGATIVE
Trichomonas: NEGATIVE

## 2022-09-25 LAB — CYTOLOGY - PAP: CYTOLOGY - PAP: NORMAL

## 2022-10-04 ENCOUNTER — Other Ambulatory Visit: Payer: Self-pay

## 2022-10-04 ENCOUNTER — Ambulatory Visit
Admission: RE | Admit: 2022-10-04 | Discharge: 2022-10-04 | Disposition: A | Discharge status: Home / Self Care | Payer: Medicaid Other | Source: Ambulatory Visit | Attending: Family Medicine | Admitting: Family Medicine

## 2022-10-04 VITALS — BP 117/78 | HR 89 | Temp 98.8°F | Resp 17

## 2022-10-04 DIAGNOSIS — J069 Acute upper respiratory infection, unspecified: Secondary | ICD-10-CM | POA: Insufficient documentation

## 2022-10-04 DIAGNOSIS — Z20822 Contact with and (suspected) exposure to covid-19: Secondary | ICD-10-CM | POA: Insufficient documentation

## 2022-10-04 MED ORDER — PROMETHAZINE-DM 6.25-15 MG/5ML PO SYRP: 5.0000 mL | ORAL_SOLUTION | Freq: Four times a day (QID) | ORAL | 0 refills | Status: DC | PRN

## 2022-10-04 NOTE — ED Provider Notes (Signed)
[place]   [id number] 10/04/22 Arrival Time: 6962  ASSESSMENT & PLAN:  1. Viral URI with cough   2. Exposure to COVID-19 virus    Discussed typical duration of likely viral illness. COVID test sent at pt request. Work note provided. OTC symptom care as needed.  New Prescriptions   PROMETHAZINE-DEXTROMETHORPHAN (PROMETHAZINE-DM) 6.25-15 MG/5ML SYRUP    Take 5 mLs by mouth 4 (four) times daily as needed for cough.     Follow-up Information     [facility] at [facility].   Specialty: Urgent Care Why: As needed. Contact information: [address], [place] [address] [phone]                Reviewed expectations re: course of current medical issues. Questions answered. Outlined signs and symptoms indicating need for more acute intervention. Understanding verbalized. After Visit Summary given.   SUBJECTIVE: History from: Patient. Amanda Perez is a 32 y.o. female. Pt reports runny nose, nasal congestion,headache, body aches, cough, headache since Monday. Pt reports took home covid test that was positive yesterday but reports needed official test and note for work.  Normal PO intake without n/v/d.  OBJECTIVE:  Vitals:   10/04/22 1523  BP: 117/78  Pulse: 89  Resp: 17  Temp: 98.8 F (37.1 C)  TempSrc: Oral  SpO2: 97%    General appearance: alert; no distress Eyes: PERRLA; EOMI; conjunctiva normal HENT: [place]; AT; with nasal congestion Neck: supple  Lungs: speaks full sentences without difficulty; unlabored Extremities: no edema Skin: warm and dry Neurologic: normal gait Psychological: alert and cooperative; normal mood and affect  Labs:  Labs Reviewed  SARS CORONAVIRUS 2 (TAT 6-24 HRS)     No Known Allergies  Past Medical History:  Diagnosis Date   HSV-2 (herpes simplex virus 2) infection    Other disorder of menstruation and other abnormal bleeding from female genital tract 07/10/2013    Bleeding daily since IUD inserted   Pregnant 12/29/2014   Pregnant    Social History   Socioeconomic History   Marital status: Single    Spouse name: Not on file   Number of children: 4   Years of education: 12+   Highest education level: Associate degree: occupational, Hotel manager, or vocational program  Occupational History   Not on file  Tobacco Use   Smoking status: Never   Smokeless tobacco: Never  Vaping Use   Vaping Use: Never used  Substance and Sexual Activity   Alcohol use: Not Currently   Drug use: No   Sexual activity: Yes    Birth control/protection: Surgical    Comment: tubal  Other Topics Concern   Not on file  Social History Narrative   Not on file   Social Determinants of Health   Financial Resource Strain: Low Risk  (06/28/2018)   Overall Financial Resource Strain (CARDIA)    Difficulty of Paying Living Expenses: Not hard at all  Food Insecurity: No Food Insecurity (06/28/2018)   Hunger Vital Sign    Worried About Running Out of Food in the Last Year: Never true    Ran Out of Food in the Last Year: Never true  Transportation Needs: No Transportation Needs (06/28/2018)   PRAPARE - Hydrologist (Medical): No    Lack of Transportation (Non-Medical): No  Physical Activity: Insufficiently Active (06/28/2018)   Exercise Vital Sign    Days of Exercise per Week: 5 days    Minutes of Exercise  per Session: 10 min  Stress: No Stress Concern Present (06/28/2018)   [place]    Feeling of Stress : Not at all  Social Connections: Moderately Integrated (06/28/2018)   Social Connection and Isolation Panel [NHANES]    Frequency of Communication with Friends and Family: More than three times a week    Frequency of Social Gatherings with Friends and Family: More than three times a week    Attends Religious Services: 1 to 4 times per year    Active Member of [employer] or  Organizations: No    Attends Archivist Meetings: Never    Marital Status: Living with partner  Intimate Partner Violence: Not At Risk (06/28/2018)   Humiliation, Afraid, Rape, and Kick questionnaire    Fear of Current or Ex-Partner: No    Emotionally Abused: No    Physically Abused: No    Sexually Abused: No   Family History  Problem Relation Age of Onset   Hypertension Paternal Grandmother    Hypertension Maternal Grandmother    Hypertension Mother    Seizures Mother    Past Surgical History:  Procedure Laterality Date   DILATION AND EVACUATION N/A 08/15/2015   Procedure: DILATATION and curretage, repair of laceration;  Surgeon: Osborne Oman, MD;  Location: [facility];  Service: Gynecology;  Laterality: N/A;   IUD REMOVAL N/A 04/20/2021   Procedure: INTRAUTERINE DEVICE (IUD) REMOVAL;  Surgeon: Florian Buff, MD;  Location: [facility];  Service: Gynecology;  Laterality: N/A;   LAPAROSCOPIC BILATERAL SALPINGECTOMY Bilateral 04/20/2021   Procedure: LAPAROSCOPIC BILATERAL SALPINGECTOMY;  Surgeon: Florian Buff, MD;  Location: [facility];  Service: Gynecology;  Laterality: Bilateral;     Vanessa Kick, MD 10/04/22 1544

## 2022-10-04 NOTE — ED Triage Notes (Signed)
Pt reports runny nose, nasal congestion,headache, body aches, cough, headache since Monday. Pt reports took home covid test that was positive yesterday but reports needed official test and note for work.

## 2022-10-04 NOTE — Discharge Instructions (Addendum)
Be aware, your cough medication may cause drowsiness. Please do not drive, operate heavy machinery or make important decisions while on this medication, it can cloud your judgement.  Follow up with your primary care doctor or here if you are not seeing improvement of your symptoms over the next several days, sooner if you feel you are worsening.  Caring for yourself: Get plenty of rest. Drink plenty of fluids, enough so that your urine is light yellow or clear like water. If you have kidney, heart, or liver disease and have to limit fluids, talk with your doctor before you increase the amount of fluids you drink. Take an over-the-counter pain medicine if needed, such as acetaminophen (Tylenol), ibuprofen (Advil, Motrin), or naproxen (Aleve), to relieve fever, headache, and muscle aches. Read and follow all instructions on the label. No one younger than 20 should take aspirin. It has been linked to Reye syndrome, a serious illness. Before you use over the counter cough and cold medicines, check the label. These medicines may not be safe for children younger than age 74 or for people with certain health problems. If the skin around your nose and lips becomes sore, put some petroleum jelly on the area.  Avoid spreading a respiratory virus (including COVID): Wash your hands regularly, and keep your hands away from your face.  Stay home from school, work, and other public places until you are feeling better and your fever has been gone for at least 24 hours. The fever needs to have gone away on its own without the help of medicine.

## 2022-10-05 LAB — SARS CORONAVIRUS 2 (TAT 6-24 HRS): SARS Coronavirus 2: POSITIVE — AB

## 2022-11-10 ENCOUNTER — Other Ambulatory Visit (HOSPITAL_COMMUNITY)
Admission: RE | Admit: 2022-11-10 | Discharge: 2022-11-10 | Disposition: A | Discharge status: Home / Self Care | Payer: BC Managed Care – PPO | Source: Ambulatory Visit | Attending: Obstetrics & Gynecology | Admitting: Obstetrics & Gynecology

## 2022-11-10 ENCOUNTER — Other Ambulatory Visit (INDEPENDENT_AMBULATORY_CARE_PROVIDER_SITE_OTHER): Payer: BC Managed Care – PPO

## 2022-11-10 DIAGNOSIS — R8290 Unspecified abnormal findings in urine: Secondary | ICD-10-CM

## 2022-11-10 DIAGNOSIS — N941 Unspecified dyspareunia: Secondary | ICD-10-CM

## 2022-11-10 DIAGNOSIS — R1030 Lower abdominal pain, unspecified: Secondary | ICD-10-CM

## 2022-11-10 DIAGNOSIS — N898 Other specified noninflammatory disorders of vagina: Secondary | ICD-10-CM | POA: Insufficient documentation

## 2022-11-10 DIAGNOSIS — R3915 Urgency of urination: Secondary | ICD-10-CM

## 2022-11-10 LAB — POCT URINALYSIS DIPSTICK OB
Blood, UA: NEGATIVE
Glucose, UA: NEGATIVE
Ketones, UA: NEGATIVE
Nitrite, UA: NEGATIVE

## 2022-11-10 NOTE — Progress Notes (Signed)
   NURSE VISIT- VAGINITIS  SUBJECTIVE:  Amanda Perez is a 32 y.o. [id number] GYN patientfemale here for a vaginal swab for vaginitis screening.  She reports the following symptoms: discharge described as brown, dyspareunia, odor, and urinary symptoms of cloudy urine, lower abdominal pain, and urinary urgency for 5 days. Denies abnormal vaginal bleeding, significant pelvic pain, fever, or UTI symptoms.  OBJECTIVE:  There were no vitals taken for this visit.  Appears well, in no apparent distress  ASSESSMENT: Vaginal swab for vaginitis screening Urine sent for culture  PLAN: Self-collected vaginal probe for Gonorrhea, Chlamydia, Trichomonas, Bacterial Vaginosis, Yeast sent to lab Urine culture sent Treatment: to be determined once results are received Follow-up as needed if symptoms persist/worsen, or new symptoms develop  Alice Rieger  11/10/2022 10:23 AM

## 2022-11-11 LAB — URINALYSIS, ROUTINE W REFLEX MICROSCOPIC
Bilirubin, UA: NEGATIVE
Glucose, UA: NEGATIVE
Ketones, UA: NEGATIVE
Nitrite, UA: NEGATIVE
RBC, UA: NEGATIVE
Specific Gravity, UA: 1.028 (ref 1.005–1.030)
Urobilinogen, Ur: 1 mg/dL (ref 0.2–1.0)
pH, UA: 6.5 (ref 5.0–7.5)

## 2022-11-11 LAB — MICROSCOPIC EXAMINATION
Bacteria, UA: NONE SEEN
Casts: NONE SEEN /lpf
RBC, Urine: NONE SEEN /hpf (ref 0–2)

## 2022-11-13 ENCOUNTER — Other Ambulatory Visit: Payer: Self-pay | Admitting: Adult Health

## 2022-11-13 LAB — CERVICOVAGINAL ANCILLARY ONLY
Bacterial Vaginitis (gardnerella): POSITIVE — AB
Candida Glabrata: NEGATIVE
Candida Vaginitis: NEGATIVE
Chlamydia: NEGATIVE
Comment: NEGATIVE
Comment: NEGATIVE
Comment: NEGATIVE
Comment: NEGATIVE
Comment: NEGATIVE
Comment: NORMAL
Neisseria Gonorrhea: NEGATIVE
Trichomonas: NEGATIVE

## 2022-11-13 MED ORDER — METRONIDAZOLE 500 MG PO TABS: 500.0000 mg | ORAL_TABLET | Freq: Two times a day (BID) | ORAL | 0 refills | Status: DC

## 2022-11-13 NOTE — Progress Notes (Signed)
Rx flagyl for +BV on vaginal swab

## 2022-11-14 LAB — URINE CULTURE

## 2022-11-16 ENCOUNTER — Other Ambulatory Visit: Payer: Self-pay | Admitting: Adult Health

## 2022-11-16 MED ORDER — SULFAMETHOXAZOLE-TRIMETHOPRIM 800-160 MG PO TABS: 1.0000 | ORAL_TABLET | Freq: Two times a day (BID) | ORAL | 0 refills | Status: DC

## 2022-11-16 NOTE — Progress Notes (Signed)
+  proteus on urine culture will rx septra ds and push fluids

## 2022-11-22 ENCOUNTER — Other Ambulatory Visit: Payer: Self-pay

## 2022-11-22 ENCOUNTER — Emergency Department (HOSPITAL_COMMUNITY): Payer: BC Managed Care – PPO

## 2022-11-22 ENCOUNTER — Emergency Department (HOSPITAL_COMMUNITY)
Admission: EM | Admit: 2022-11-22 | Discharge: 2022-11-22 | Disposition: A | Discharge status: Home / Self Care | Payer: BC Managed Care – PPO | Attending: Emergency Medicine | Admitting: Emergency Medicine

## 2022-11-22 ENCOUNTER — Encounter (HOSPITAL_COMMUNITY): Payer: Self-pay

## 2022-11-22 DIAGNOSIS — K8020 Calculus of gallbladder without cholecystitis without obstruction: Secondary | ICD-10-CM | POA: Diagnosis not present

## 2022-11-22 DIAGNOSIS — R1030 Lower abdominal pain, unspecified: Secondary | ICD-10-CM | POA: Diagnosis not present

## 2022-11-22 DIAGNOSIS — S39011A Strain of muscle, fascia and tendon of abdomen, initial encounter: Secondary | ICD-10-CM | POA: Insufficient documentation

## 2022-11-22 DIAGNOSIS — X58XXXA Exposure to other specified factors, initial encounter: Secondary | ICD-10-CM | POA: Diagnosis not present

## 2022-11-22 DIAGNOSIS — T148XXA Other injury of unspecified body region, initial encounter: Secondary | ICD-10-CM

## 2022-11-22 LAB — COMPREHENSIVE METABOLIC PANEL
ALT: 15 U/L (ref 0–44)
AST: 16 U/L (ref 15–41)
Albumin: 4 g/dL (ref 3.5–5.0)
Alkaline Phosphatase: 67 U/L (ref 38–126)
Anion gap: 6 (ref 5–15)
BUN: 10 mg/dL (ref 6–20)
CO2: 22 mmol/L (ref 22–32)
Calcium: 8.8 mg/dL — ABNORMAL LOW (ref 8.9–10.3)
Chloride: 105 mmol/L (ref 98–111)
Creatinine, Ser: 0.88 mg/dL (ref 0.44–1.00)
GFR, Estimated: >60 mL/min (ref >60)
Glucose, Bld: 99 mg/dL (ref 70–99)
Potassium: 4.1 mmol/L (ref 3.5–5.1)
Sodium: 133 mmol/L — ABNORMAL LOW (ref 135–145)
Total Bilirubin: 0.6 mg/dL (ref 0.3–1.2)
Total Protein: 7.6 g/dL (ref 6.5–8.1)

## 2022-11-22 LAB — URINALYSIS, ROUTINE W REFLEX MICROSCOPIC
Bilirubin Urine: NEGATIVE
Glucose, UA: NEGATIVE mg/dL
Hgb urine dipstick: NEGATIVE
Ketones, ur: NEGATIVE mg/dL
Nitrite: NEGATIVE
Protein, ur: NEGATIVE mg/dL
Specific Gravity, Urine: 1.012 (ref 1.005–1.030)
pH: 6 (ref 5.0–8.0)

## 2022-11-22 LAB — CBC WITH DIFFERENTIAL/PLATELET
Abs Immature Granulocytes: 0.03 10*3/uL (ref 0.00–0.07)
Basophils Absolute: 0 10*3/uL (ref 0.0–0.1)
Basophils Relative: 1 %
Eosinophils Absolute: 0.3 10*3/uL (ref 0.0–0.5)
Eosinophils Relative: 6 %
HCT: 36.5 % (ref 36.0–46.0)
Hemoglobin: 12.6 g/dL (ref 12.0–15.0)
Immature Granulocytes: 1 %
Lymphocytes Relative: 36 %
Lymphs Abs: 1.9 10*3/uL (ref 0.7–4.0)
MCH: 29.8 pg (ref 26.0–34.0)
MCHC: 34.5 g/dL (ref 30.0–36.0)
MCV: 86.3 fL (ref 80.0–100.0)
Monocytes Absolute: 0.4 10*3/uL (ref 0.1–1.0)
Monocytes Relative: 8 %
Neutro Abs: 2.5 10*3/uL (ref 1.7–7.7)
Neutrophils Relative %: 48 %
Platelets: 225 10*3/uL (ref 150–400)
RBC: 4.23 MIL/uL (ref 3.87–5.11)
RDW: 12.7 % (ref 11.5–15.5)
WBC: 5.2 10*3/uL (ref 4.0–10.5)
nRBC: 0 % (ref 0.0–0.2)

## 2022-11-22 LAB — LIPASE, BLOOD: Lipase: 35 U/L (ref 11–51)

## 2022-11-22 LAB — PREGNANCY, URINE: Preg Test, Ur: NEGATIVE

## 2022-11-22 MED ORDER — KETOROLAC TROMETHAMINE 15 MG/ML IJ SOLN
15.0000 mg | Freq: Once | INTRAMUSCULAR | Status: AC
  Administered 2022-11-22: 15 mg via INTRAVENOUS
  Filled 2022-11-22: qty 1

## 2022-11-22 MED ORDER — CYCLOBENZAPRINE HCL 10 MG PO TABS: 10.0000 mg | ORAL_TABLET | Freq: Two times a day (BID) | ORAL | 0 refills | Status: AC | PRN

## 2022-11-22 NOTE — ED Triage Notes (Signed)
Pt states she is having moderate lower abd pain and lower back pain since this morning. States it feels like "contractions". States she had tubes removed 2 years ago, still has a uterus and menstrual cycle (last one in mid February).   Denies vaginal bleeding. Recently started medication for UTI last Friday.  Denies urinary symptoms, no fevers. Pt A & O, ambulatory in triage, NAD.

## 2022-11-22 NOTE — ED Provider Notes (Signed)
Amanda Perez   CSN: [id number] Arrival date & time: 11/22/22  1124     History  Chief Complaint  Patient presents with   Abdominal Pain   Back Pain    Amanda Perez is a 32 y.o. female presented with lower abdominal pain that began this morning.  Patient states pain is constant and that she has tried Tylenol with no relief.  Pain radiates around her right flank.  Patient denied any dysuria or hematuria or history of stones.  Patient described the pain as contraction-like and exacerbated with movements.  States she has had her tubes tied however still has periods and is due to begin her cycle in 2 days.  Patient is currently on Flagyl for BV and has Proteus positive urine and is on Bactrim.  Patient endorsed sensation in perineum  Patient notes she does a lot of heavy lifting at her job.  Patient denied chest pain, shortness of breath, nausea/vomiting, fevers, chills, change in sensation/motor skills, headaches, vision changes, urinary/bowel incontinence  Home Medications Prior to Admission medications   Medication Sig Start Date End Date Taking? Authorizing Provider  acetaminophen (TYLENOL) 500 MG tablet Take 1,000 mg by mouth every 6 (six) hours as needed for moderate pain or fever.   Yes [provider]  cyclobenzaprine (FLEXERIL) 10 MG tablet Take 1 tablet (10 mg total) by mouth 2 (two) times daily as needed for muscle spasms. 11/22/22  Yes Chuck Hint, PA-C  desvenlafaxine (PRISTIQ) 50 MG 24 hr tablet Take 50 mg by mouth daily. 10/26/22  Yes [provider]  Multiple Vitamin (MULTIVITAMIN WITH MINERALS) TABS tablet Take 1 tablet by mouth daily.   Yes [provider]  phentermine (ADIPEX-P) 37.5 MG tablet Take 37.5 mg by mouth daily before breakfast.   Yes [provider]  sulfamethoxazole-trimethoprim (BACTRIM DS) 800-160 MG tablet Take 1 tablet by mouth 2 (two) times daily. Take 1 bid  11/16/22  Yes Derrek Monaco A, NP  metroNIDAZOLE (FLAGYL) 500 MG tablet Take 1 tablet (500 mg total) by mouth 2 (two) times daily. Patient not taking: Reported on 11/22/2022 11/13/22   Estill Dooms, NP  promethazine-dextromethorphan (PROMETHAZINE-DM) 6.25-15 MG/5ML syrup Take 5 mLs by mouth 4 (four) times daily as needed for cough. Patient not taking: Reported on 11/10/2022 10/04/22   Vanessa Kick, MD      Allergies    Patient has no known allergies.    Review of Systems   Review of Systems  Gastrointestinal:  Positive for abdominal pain.  Musculoskeletal:  Positive for back pain.  See HPI  Physical Exam Updated Vital Signs BP 109/76   Pulse 96   Temp 98 F (36.7 C) (Oral)   Resp 16   Ht '5\' 5"'$  (1.651 m)   Wt 89.4 kg   SpO2 99%   BMI 32.78 kg/m  Physical Exam Vitals reviewed.  Constitutional:      General: She is not in acute distress. HENT:     Head: Normocephalic and atraumatic.  Eyes:     Extraocular Movements: Extraocular movements intact.     Conjunctiva/sclera: Conjunctivae normal.     Pupils: Pupils are equal, round, and reactive to light.  Cardiovascular:     Rate and Rhythm: Normal rate and regular rhythm.     Pulses: Normal pulses.     Heart sounds: Normal heart sounds.     Comments: 2+ bilateral radial/dorsalis pedis pulses with regular rate Pulmonary:  Effort: Pulmonary effort is normal. No respiratory distress.     Breath sounds: Normal breath sounds.  Abdominal:     Palpations: Abdomen is soft.     Tenderness: There is abdominal tenderness (Lower abdominal region). There is no guarding or rebound. Negative signs include Murphy's sign, Rovsing's sign, McBurney's sign and psoas sign.     Hernia: No hernia is present.  Musculoskeletal:        General: Tenderness (Flank tenderness) present. Normal range of motion.     Cervical back: Normal range of motion and neck supple.     Comments: 5 out of 5 bilateral grip/leg extension strength No  Abnormalities palpated in the midline  Skin:    General: Skin is warm and dry.     Capillary Refill: Capillary refill takes less than 2 seconds.     Comments: No overlying skin color changes or rashes noted  Neurological:     General: No focal deficit present.     Mental Status: She is alert and oriented to person, place, and time.     Comments: Sensation intact in all 4 limbs  Psychiatric:        Mood and Affect: Mood normal.     ED Results / Procedures / Treatments   Labs (all labs ordered are listed, but only abnormal results are displayed) Labs Reviewed  COMPREHENSIVE METABOLIC PANEL - Abnormal; Notable for the following components:      Result Value   Sodium 133 (*)    Calcium 8.8 (*)    All other components within normal limits  CBC WITH DIFFERENTIAL/PLATELET  LIPASE, BLOOD  PREGNANCY, URINE  URINALYSIS, ROUTINE W REFLEX MICROSCOPIC    EKG None  Radiology CT Renal Stone Study  Result Date: 11/22/2022 CLINICAL DATA:  Lower abdominal pain since this morning EXAM: CT ABDOMEN AND PELVIS WITHOUT CONTRAST TECHNIQUE: Multidetector CT imaging of the abdomen and pelvis was performed following the standard protocol without IV contrast. RADIATION DOSE REDUCTION: This exam was performed according to the departmental dose-optimization program which includes automated exposure control, adjustment of the mA and/or kV according to patient size and/or use of iterative reconstruction technique. COMPARISON:  None Available. FINDINGS: Lower chest: 4 mm left lower lobe pulmonary nodule, likely benign sequela of prior infection/inflammation and requires no independent imaging follow-up. Hepatobiliary: Unremarkable noncontrast enhanced appearance of the hepatic parenchyma. Cholelithiasis without findings of acute cholecystitis. No biliary ductal dilation. Pancreas: No pancreatic ductal dilation or evidence of acute inflammation. Spleen: No splenomegaly. Adrenals/Urinary Tract: Bilateral adrenal  glands are within normal limits. Right kidney is unremarkable without hydronephrosis or nephrolithiasis. 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 Urinary bladder is unremarkable for degree of distension. Stomach/Bowel: No radiopaque enteric contrast material was administered. Stomach is unremarkable for degree of distension. No pathologic dilation of small or large bowel. Normal appendix. Moderate volume of formed stool in the colon. Vascular/Lymphatic: Normal caliber abdominal aorta. Smooth IVC contours. No pathologically enlarged abdominal or pelvic lymph nodes. Reproductive: Uterus and bilateral adnexa are unremarkable in noncontrast enhanced CT appearance for reproductive age female. Other: Trace pelvic free fluid is within physiologic normal limits. Musculoskeletal: No acute osseous abnormality. IMPRESSION: 1. No acute abnormality in the abdomen or pelvis. 2. Cholelithiasis without findings of acute cholecystitis. 3. Moderate volume of formed stool in the colon may reflect constipation. Electronically Signed   By: Dahlia Bailiff M.D.   On: 11/22/2022 16:36    Procedures Procedures    Medications Ordered in ED Medications  ketorolac (TORADOL) 15  MG/ML injection 15 mg (15 mg Intravenous Given 11/22/22 1624)    ED Course/ Medical Decision Making/ A&P                             Medical Decision Making Amount and/or Complexity of Data Reviewed Labs: ordered. Radiology: ordered.  Risk Prescription drug management.   Pennie Banter 32 y.o. presented today for lower abdominal pain. Working DDx that I considered at this time includes, but not limited to, MSK, gastroenteritis, colitis, small bowel obstruction, appendicitis, cholecystitis, pancreatitis, nephrolithiasis, UTI, pyleonephritis, ruptured ectopic pregnancy, PID, ovarian torsion, cauda equina.  R/o DDx: Pancreatitis, nephrolithiasis, ruptured ectopic pregnancy, pyelonephritis, appendicitis, cholecystitis, gastroenteritis, colitis, SBO, PID,  ovarian torsion, cauda equina: These are considered less likely due to history of present illness and physical exam findings.  Review of prior external notes: 11/16/2022 progress notes  Unique Tests and My Interpretation:  UA: pending CBC with differential: Unremarkable CMP: Unremarkable Lipase: Unremarkable Urine pregnancy: Negative  CT Renal Study: Cholelithiasis without cholecystitis, moderate stool  Discussion with Independent Historian: None  Discussion of Management of Tests: None  Risk: Low:  - based on diagnostic testing/clinical impression and treatment plan  Risk Stratification Score: None  Plan: Patient presented for abdominal pain. On exam patient was in no acute distress and had stable vitals.  Patient was tender in the lower abdomen and had positive mild CVA tenderness bilaterally.  Labs will be ordered along with a CT scan to evaluate for possible pyelonephritis or stone.  Patient was given Toradol for pain.  Patient CT was negative for any acute intra-abdominal pathology causing her symptoms.  I suspect at this time patient most likely has muscle strain due to heavy lifting as CT and labs have ruled out any intra-abdominal pathology possibly causing her pain.  This is supported by the fact her pain is also exacerbated with movement as well.  Patient will be given a work Perez and Flexeril.  At this time I have low suspicion for any life-threatening diagnoses and feel patient is appropriate for outpatient follow-up with supportive management.  Since patient has documented UTI and is already on Bactrim and so patient will be discharged for urine results that she most likely still has a UTI as she grew Proteus yesterday and that her urine culture outpatient.  Encouraged patient to continue taking her Bactrim and follow-up with her primary care if symptoms persisted.  Patient was given return precautions.patient stable for discharge at this time.  Patient verbalized  understanding of plan.         Final Clinical Impression(s) / ED Diagnoses Final diagnoses:  Muscle strain    Rx / DC Orders ED Discharge Orders          Ordered    cyclobenzaprine (FLEXERIL) 10 MG tablet  2 times daily PRN        11/22/22 1702              Elvina Sidle 11/22/22 1706    Isla Pence, MD 11/27/22 [phone]

## 2022-11-22 NOTE — Discharge Instructions (Addendum)
Please pick up the muscle relaxer I prescribed for you and you may alternate every 6 hours as needed for pain between Tylenol and ibuprofen.  Please follow-up with your primary care provider in the next 2 days regarding recent symptoms and ER visit.  Please continue take your Bactrim for your UTI as well.  Please monitor your symptoms and if they worsen please return to the ER.

## 2022-11-22 NOTE — ED Provider Triage Note (Signed)
Emergency Medicine Provider Triage Evaluation Note  Amanda Perez , a 32 y.o. female  was evaluated in triage.  Pt complains of lower abdominal pain that began this morning.  Patient states pain is constant and that she has tried Tylenol with no relief.  Pain radiates around her right flank.  Patient denied any dysuria or hematuria or history of stones.  Patient described the pain as contraction-like.  Patient denied chest pain, shortness of breath, nausea/vomiting, fevers, chills, change in sensation/motor skills, headaches, vision changes  Review of Systems  Positive: See HPI Negative: See HPI  Physical Exam  BP (!) 126/95 (BP Location: Right Arm)   Pulse 81   Temp 98 F (36.7 C) (Oral)   Resp 16   Ht '5\' 5"'$  (1.651 m)   Wt 89.4 kg   SpO2 100%   BMI 32.78 kg/m  Gen:   Awake, no distress   Resp:  Normal effort  MSK:   Moves extremities without difficulty  Other:  Bilateral CVA tenderness, tender to palpation in lower abdomen, abdomen soft with no overlying skin color changes  Medical Decision Making  Medically screening exam initiated at 1:16 PM.  Appropriate orders placed.  Loann Scalf was informed that the remainder of the evaluation will be completed by another provider, this initial triage assessment does not replace that evaluation, and the importance of remaining in the ED until their evaluation is complete.  Workup initiated, suspect possible stone   Chuck Hint, PA-C 11/22/22 1324

## 2023-02-04 DIAGNOSIS — H5213 Myopia, bilateral: Secondary | ICD-10-CM | POA: Diagnosis not present

## 2023-05-03 DIAGNOSIS — Z299 Encounter for prophylactic measures, unspecified: Secondary | ICD-10-CM | POA: Diagnosis not present

## 2023-05-03 DIAGNOSIS — F332 Major depressive disorder, recurrent severe without psychotic features: Secondary | ICD-10-CM | POA: Diagnosis not present

## 2023-05-03 DIAGNOSIS — E669 Obesity, unspecified: Secondary | ICD-10-CM | POA: Diagnosis not present

## 2023-05-15 DIAGNOSIS — L700 Acne vulgaris: Secondary | ICD-10-CM | POA: Diagnosis not present

## 2023-05-15 DIAGNOSIS — L2084 Intrinsic (allergic) eczema: Secondary | ICD-10-CM | POA: Diagnosis not present

## 2023-05-15 DIAGNOSIS — L219 Seborrheic dermatitis, unspecified: Secondary | ICD-10-CM | POA: Diagnosis not present

## 2023-06-07 ENCOUNTER — Encounter: Payer: Self-pay | Admitting: Obstetrics & Gynecology

## 2023-06-07 ENCOUNTER — Other Ambulatory Visit (HOSPITAL_COMMUNITY)
Admission: RE | Admit: 2023-06-07 | Discharge: 2023-06-07 | Disposition: A | Discharge status: Home / Self Care | Payer: Medicaid Other | Source: Ambulatory Visit | Attending: Obstetrics & Gynecology | Admitting: Obstetrics & Gynecology

## 2023-06-07 ENCOUNTER — Ambulatory Visit: Payer: Medicaid Other | Admitting: Obstetrics & Gynecology

## 2023-06-07 VITALS — BP 112/72 | HR 87 | Ht 64.0 in | Wt 201.0 lb

## 2023-06-07 DIAGNOSIS — Z124 Encounter for screening for malignant neoplasm of cervix: Secondary | ICD-10-CM

## 2023-06-07 DIAGNOSIS — N898 Other specified noninflammatory disorders of vagina: Secondary | ICD-10-CM

## 2023-06-07 DIAGNOSIS — N72 Inflammatory disease of cervix uteri: Secondary | ICD-10-CM

## 2023-06-07 MED ORDER — DOXYCYCLINE HYCLATE 100 MG PO TABS: 100.0000 mg | ORAL_TABLET | Freq: Two times a day (BID) | ORAL | 0 refills | Status: DC

## 2023-06-07 MED ORDER — METRONIDAZOLE 0.75 % VA GEL: 1.0000 | Freq: Every day | VAGINAL | 5 refills | Status: DC

## 2023-06-07 NOTE — Progress Notes (Signed)
Chief Complaint  Patient presents with   Vaginal Bleeding    After intercourse      32 y.o. [id number] No LMP recorded. The current method of family planning is none.  Outpatient Encounter Medications as of 06/07/2023  Medication Sig   acetaminophen (TYLENOL) 500 MG tablet Take 1,000 mg by mouth every 6 (six) hours as needed for moderate pain or fever.   cyclobenzaprine (FLEXERIL) 10 MG tablet Take 1 tablet (10 mg total) by mouth 2 (two) times daily as needed for muscle spasms.   desvenlafaxine (PRISTIQ) 50 MG 24 hr tablet Take 50 mg by mouth daily.   doxycycline (VIBRA-TABS) 100 MG tablet Take 1 tablet (100 mg total) by mouth 2 (two) times daily.   metroNIDAZOLE (METROGEL) 0.75 % vaginal gel Place 1 Applicatorful vaginally at bedtime.   Multiple Vitamin (MULTIVITAMIN WITH MINERALS) TABS tablet Take 1 tablet by mouth daily.   phentermine (ADIPEX-P) 37.5 MG tablet Take 37.5 mg by mouth daily before breakfast.   [DISCONTINUED] metroNIDAZOLE (FLAGYL) 500 MG tablet Take 1 tablet (500 mg total) by mouth 2 (two) times daily. (Patient not taking: Reported on 11/22/2022)   [DISCONTINUED] promethazine-dextromethorphan (PROMETHAZINE-DM) 6.25-15 MG/5ML syrup Take 5 mLs by mouth 4 (four) times daily as needed for cough. (Patient not taking: Reported on 11/10/2022)   [DISCONTINUED] sulfamethoxazole-trimethoprim (BACTRIM DS) 800-160 MG tablet Take 1 tablet by mouth 2 (two) times daily. Take 1 bid   No facility-administered encounter medications on file as of 06/07/2023.    Subjective Pt with bleeding after sex nearly two weeks ago, happened 2 days in a row No pain Has not noticed discharge Happened last year as well-->had BV and stopped after therapy Not using BCM  Past Medical History:  Diagnosis Date   HSV-2 (herpes simplex virus 2) infection    Other disorder of menstruation and other abnormal bleeding from female genital tract 07/10/2013   Bleeding daily since IUD inserted   Pregnant  12/29/2014   Pregnant     Past Surgical History:  Procedure Laterality Date   DILATION AND EVACUATION N/A 08/15/2015   Procedure: DILATATION and curretage, repair of laceration;  Surgeon: Tereso Newcomer, MD;  Location: [facility];  Service: Gynecology;  Laterality: N/A;   IUD REMOVAL N/A 04/20/2021   Procedure: INTRAUTERINE DEVICE (IUD) REMOVAL;  Surgeon: Lazaro Arms, MD;  Location: [facility];  Service: Gynecology;  Laterality: N/A;   LAPAROSCOPIC BILATERAL SALPINGECTOMY Bilateral 04/20/2021   Procedure: LAPAROSCOPIC BILATERAL SALPINGECTOMY;  Surgeon: Lazaro Arms, MD;  Location: [facility];  Service: Gynecology;  Laterality: Bilateral;    OB History     Gravida  3   Para  3   Term  2   Preterm  1   AB      Living  4      SAB      IAB      Ectopic      Multiple  1   Live Births  4           No Known Allergies  Social History   Socioeconomic History   Marital status: Single    Spouse name: Not on file   Number of children: 4   Years of education: 12+   Highest education level: Associate degree: occupational, Scientist, product/process development, or vocational program  Occupational History   Not on file  Tobacco Use   Smoking status: Never   Smokeless tobacco: Never  Vaping Use   Vaping status: Never Used  Substance and Sexual Activity   Alcohol use: Yes   Drug use: No   Sexual activity: Yes    Birth control/protection: Surgical    Comment: tubal  Other Topics Concern   Not on file  Social History Narrative   Not on file   Social Determinants of Health   Financial Resource Strain: Low Risk  (06/28/2018)   Overall Financial Resource Strain (CARDIA)    Difficulty of Paying Living Expenses: Not hard at all  Food Insecurity: No Food Insecurity (06/28/2018)   Hunger Vital Sign    Worried About Running Out of Food in the Last Year: Never true    Ran Out of Food in the Last Year: Never true  Transportation Needs: No Transportation Needs (06/28/2018)   PRAPARE - Therapist, art (Medical): No    Lack of Transportation (Non-Medical): No  Physical Activity: Insufficiently Active (06/28/2018)   Exercise Vital Sign    Days of Exercise per Week: 5 days    Minutes of Exercise per Session: 10 min  Stress: No Stress Concern Present (06/28/2018)   [employer] of Occupational Health - Occupational Stress Questionnaire    Feeling of Stress : Not at all  Social Connections: Moderately Integrated (06/28/2018)   Social Connection and Isolation Panel [NHANES]    Frequency of Communication with Friends and Family: More than three times a week    Frequency of Social Gatherings with Friends and Family: More than three times a week    Attends Religious Services: 1 to 4 times per year    Active Member of [employer] or Organizations: No    Attends Engineer, structural: Never    Marital Status: Living with partner    Family History  Problem Relation Age of Onset   Hypertension Paternal Grandmother    Hypertension Maternal Grandmother    Hypertension Mother    Seizures Mother     Medications:       Current Outpatient Medications:    acetaminophen (TYLENOL) 500 MG tablet, Take 1,000 mg by mouth every 6 (six) hours as needed for moderate pain or fever., Disp: , Rfl:    cyclobenzaprine (FLEXERIL) 10 MG tablet, Take 1 tablet (10 mg total) by mouth 2 (two) times daily as needed for muscle spasms., Disp: 20 tablet, Rfl: 0   desvenlafaxine (PRISTIQ) 50 MG 24 hr tablet, Take 50 mg by mouth daily., Disp: , Rfl:    doxycycline (VIBRA-TABS) 100 MG tablet, Take 1 tablet (100 mg total) by mouth 2 (two) times daily., Disp: 20 tablet, Rfl: 0   metroNIDAZOLE (METROGEL) 0.75 % vaginal gel, Place 1 Applicatorful vaginally at bedtime., Disp: 70 g, Rfl: 5   Multiple Vitamin (MULTIVITAMIN WITH MINERALS) TABS tablet, Take 1 tablet by mouth daily., Disp: , Rfl:    phentermine (ADIPEX-P) 37.5 MG tablet, Take 37.5 mg by mouth daily before breakfast., Disp: , Rfl:    Objective Blood pressure 112/72, pulse 87, height 5\' 4"  (1.626 m), weight 201 lb (91.2 kg).  General WDWN female NAD Vulva:  normal appearing vulva with no masses, tenderness or lesions Vagina:  normal mucosa, grey discharge some froth Cervix:  irritated, cherry red, no bleeding with exam, no polyps or other lesions notedno lesions, alittle tender on exam Uterus:  normal size, contour, position, consistency, mobility, non-tender Adnexa: ovaries:present,  normal adnexa in size, nontender and no masses   Pertinent ROS No burning with urination, frequency or urgency No nausea, vomiting or diarrhea Nor fever chills  or other constitutional symptoms   Labs or studies Swab sent  Wet Prep:   A sample of vaginal discharge was obtained from the posterior fornix using a cotton swab. 2 drops of saline were placed on a slide and the cotton swab was immersed in the saline. Microscopic evaluation was performed and results were as follows:  Negative  for yeast  Negative for clue cells , consistent with Bacterial vaginosis Negative for trichomonas   Very heavy  WBC population   Whiff test: Negative     Impression + Management Plan: Diagnoses this Encounter::   ICD-10-CM   1. Acute cervicitis  N72    will treat with metro gel and doxycycline, repeat exam 3 weeks, swab results pending    2. Routine cervical smear  Z12.4 CANCELED: Cytology - PAP( [place])    3. Vaginal discharge  N89.8 Cervicovaginal ancillary only( [place])        Medications prescribed during  this encounter: Meds ordered this encounter  Medications   metroNIDAZOLE (METROGEL) 0.75 % vaginal gel    Sig: Place 1 Applicatorful vaginally at bedtime.    Dispense:  70 g    Refill:  5   doxycycline (VIBRA-TABS) 100 MG tablet    Sig: Take 1 tablet (100 mg total) by mouth 2 (two) times daily.    Dispense:  20 tablet    Refill:  0    Labs or Scans Ordered during this encounter: No orders of the defined  types were placed in this encounter.     Follow up Return in about 3 weeks (around 06/28/2023) for Follow up, with Dr Despina Hidden.

## 2023-06-08 LAB — CERVICOVAGINAL ANCILLARY ONLY
Bacterial Vaginitis (gardnerella): POSITIVE — AB
Candida Glabrata: NEGATIVE
Candida Vaginitis: NEGATIVE
Chlamydia: NEGATIVE
Comment: NEGATIVE
Comment: NEGATIVE
Comment: NEGATIVE
Comment: NEGATIVE
Comment: NEGATIVE
Comment: NORMAL
Neisseria Gonorrhea: NEGATIVE
Trichomonas: NEGATIVE

## 2023-06-26 ENCOUNTER — Ambulatory Visit: Payer: Medicaid Other | Admitting: Obstetrics & Gynecology

## 2023-06-26 VITALS — BP 130/75 | HR 76 | Ht 64.0 in | Wt 205.0 lb

## 2023-06-26 DIAGNOSIS — B3731 Acute candidiasis of vulva and vagina: Secondary | ICD-10-CM | POA: Diagnosis not present

## 2023-06-26 DIAGNOSIS — N72 Inflammatory disease of cervix uteri: Secondary | ICD-10-CM | POA: Diagnosis not present

## 2023-06-26 MED ORDER — TERCONAZOLE 0.4 % VA CREA: 1.0000 | TOPICAL_CREAM | Freq: Every day | VAGINAL | 0 refills | Status: DC

## 2023-06-26 NOTE — Progress Notes (Signed)
Follow up appointment for results: Rx for cervicitis  Chief Complaint  Patient presents with   Follow-up    Blood pressure 130/75, pulse 76, height 5\' 4"  (1.626 m), weight 205 lb (93 kg).  Pt took her doxycycline + metro gel No symptoms  Exam heavy yeast infection not surprising and actually good sign after her cervicitis treatment  MEDS ordered this encounter: Meds ordered this encounter  Medications   terconazole (TERAZOL 7) 0.4 % vaginal cream    Sig: Place 1 applicator vaginally at bedtime.    Dispense:  45 g    Refill:  0    Orders for this encounter: No orders of the defined types were placed in this encounter.   Impression + Management Plan   ICD-10-CM   1. after cervicitis treatment  B37.31    Terazol 7 for likely treatment of glabratta    2. Acute cervicitis, resolved  N72     Recommend post coital boric acid suppostiories 600 mg  Follow Up: Return if symptoms worsen or fail to improve.     All questions were answered.  Past Medical History:  Diagnosis Date   HSV-2 (herpes simplex virus 2) infection    Other disorder of menstruation and other abnormal bleeding from female genital tract 07/10/2013   Bleeding daily since IUD inserted   Pregnant 12/29/2014   Pregnant     Past Surgical History:  Procedure Laterality Date   DILATION AND EVACUATION N/A 08/15/2015   Procedure: DILATATION and curretage, repair of laceration;  Surgeon: Tereso Newcomer, MD;  Location: [facility];  Service: Gynecology;  Laterality: N/A;   IUD REMOVAL N/A 04/20/2021   Procedure: INTRAUTERINE DEVICE (IUD) REMOVAL;  Surgeon: Lazaro Arms, MD;  Location: [facility];  Service: Gynecology;  Laterality: N/A;   LAPAROSCOPIC BILATERAL SALPINGECTOMY Bilateral 04/20/2021   Procedure: LAPAROSCOPIC BILATERAL SALPINGECTOMY;  Surgeon: Lazaro Arms, MD;  Location: [facility];  Service: Gynecology;  Laterality: Bilateral;    OB History     Gravida  3   Para  3   Term  2   Preterm  1   AB       Living  4      SAB      IAB      Ectopic      Multiple  1   Live Births  4           No Known Allergies  Social History   Socioeconomic History   Marital status: Single    Spouse name: Not on file   Number of children: 4   Years of education: 12+   Highest education level: Associate degree: occupational, Scientist, product/process development, or vocational program  Occupational History   Not on file  Tobacco Use   Smoking status: Never   Smokeless tobacco: Never  Vaping Use   Vaping status: Never Used  Substance and Sexual Activity   Alcohol use: Yes   Drug use: No   Sexual activity: Yes    Birth control/protection: Surgical    Comment: tubal  Other Topics Concern   Not on file  Social History Narrative   Not on file   Social Determinants of Health   Financial Resource Strain: Low Risk  (06/28/2018)   Overall Financial Resource Strain (CARDIA)    Difficulty of Paying Living Expenses: Not hard at all  Food Insecurity: No Food Insecurity (06/28/2018)   Hunger Vital Sign    Worried About Running Out of Food in the Last  Year: Never true    Ran Out of Food in the Last Year: Never true  Transportation Needs: No Transportation Needs (06/28/2018)   PRAPARE - Administrator, Civil Service (Medical): No    Lack of Transportation (Non-Medical): No  Physical Activity: Insufficiently Active (06/28/2018)   Exercise Vital Sign    Days of Exercise per Week: 5 days    Minutes of Exercise per Session: 10 min  Stress: No Stress Concern Present (06/28/2018)   [employer] of Occupational Health - Occupational Stress Questionnaire    Feeling of Stress : Not at all  Social Connections: Moderately Integrated (06/28/2018)   Social Connection and Isolation Panel [NHANES]    Frequency of Communication with Friends and Family: More than three times a week    Frequency of Social Gatherings with Friends and Family: More than three times a week    Attends Religious Services: 1 to 4  times per year    Active Member of [employer] or Organizations: No    Attends Banker Meetings: Never    Marital Status: Living with partner    Family History  Problem Relation Age of Onset   Hypertension Paternal Grandmother    Hypertension Maternal Grandmother    Hypertension Mother    Seizures Mother

## 2023-06-30 ENCOUNTER — Other Ambulatory Visit: Payer: Self-pay | Admitting: Obstetrics & Gynecology

## 2023-07-02 MED ORDER — TERCONAZOLE 0.4 % VA CREA: 1.0000 | TOPICAL_CREAM | Freq: Every day | VAGINAL | 0 refills | Status: DC

## 2023-08-03 DIAGNOSIS — F332 Major depressive disorder, recurrent severe without psychotic features: Secondary | ICD-10-CM | POA: Diagnosis not present

## 2023-08-03 DIAGNOSIS — E669 Obesity, unspecified: Secondary | ICD-10-CM | POA: Diagnosis not present

## 2023-08-03 DIAGNOSIS — Z299 Encounter for prophylactic measures, unspecified: Secondary | ICD-10-CM | POA: Diagnosis not present

## 2023-10-15 ENCOUNTER — Ambulatory Visit: Payer: Medicaid Other | Admitting: Adult Health

## 2023-10-22 ENCOUNTER — Ambulatory Visit: Payer: Medicaid Other | Admitting: Adult Health

## 2023-10-29 ENCOUNTER — Ambulatory Visit: Payer: Medicaid Other | Admitting: Adult Health

## 2023-10-29 ENCOUNTER — Encounter: Payer: Self-pay | Admitting: Adult Health

## 2023-10-29 VITALS — BP 138/81 | HR 95 | Ht 64.0 in | Wt 193.0 lb

## 2023-10-29 DIAGNOSIS — B9689 Other specified bacterial agents as the cause of diseases classified elsewhere: Secondary | ICD-10-CM | POA: Diagnosis not present

## 2023-10-29 DIAGNOSIS — N760 Acute vaginitis: Secondary | ICD-10-CM | POA: Diagnosis not present

## 2023-10-29 DIAGNOSIS — N72 Inflammatory disease of cervix uteri: Secondary | ICD-10-CM | POA: Diagnosis not present

## 2023-10-29 DIAGNOSIS — N939 Abnormal uterine and vaginal bleeding, unspecified: Secondary | ICD-10-CM

## 2023-10-29 DIAGNOSIS — N898 Other specified noninflammatory disorders of vagina: Secondary | ICD-10-CM | POA: Insufficient documentation

## 2023-10-29 LAB — POCT WET PREP (WET MOUNT)
Clue Cells Wet Prep Whiff POC: POSITIVE
WBC, Wet Prep HPF POC: POSITIVE

## 2023-10-29 MED ORDER — METRONIDAZOLE 500 MG PO TABS: 500.0000 mg | ORAL_TABLET | Freq: Two times a day (BID) | ORAL | 0 refills | Status: DC

## 2023-10-29 NOTE — Progress Notes (Signed)
  Subjective:     Patient ID: Amanda Perez, female   DOB: 06/30/1991, 33 y.o.   MRN: [id number]  HPI Amanda Perez is a 33 year old black female,single, [id number] in complaining of vaginal bleeding with intercourse. Denies any pain.  Last pap January 2024 with PCP, NILM  PCP Dr Sherril Croon  Review of Systems +vaginal bleeding with intercourse, may last 30-60 minutes  Denies any pain, or vaginal discharge, no itching or burning Reviewed past medical,surgical, social and family history. Reviewed medications and allergies.     Objective:   Physical Exam BP 138/81 (BP Location: Right Arm, Patient Position: Sitting, Cuff Size: Normal)   Pulse 95   Ht 5\' 4"  (1.626 m)   Wt 193 lb (87.5 kg)   LMP 10/12/2023   BMI 33.13 kg/m     Skin warm and dry.Pelvic: external genitalia is normal in appearance no lesions, vagina: creamy discharge with odor,urethra has no lesions or masses noted, cervix: bulbous, everted and irritated os, uterus: normal size, shape and contour, non tender, no masses felt, adnexa: no masses or tenderness noted. Bladder is non tender and no masses felt. Wet prep: + for clue cells and +WBCs Fall risk is low  Up[facility] - 10/29/23 4098       Pregnancy Intention Screening   Does the patient want to become pregnant in the next year? No    Does the patient's partner want to become pregnant in the next year? No    Would the patient like to discuss contraceptive options today? No      Contraception Wrap Up   Current Method Female Sterilization    End Method Female Sterilization    Contraception Counseling Provided No            Examination chaperoned by Faith Rogue LPN Assessment:     1. Vaginal bleeding (Primary) +bleeding intercourse, may last 30-60 minutes No pain  2. Vaginal discharge +creamy discharge on exam  - POCT Wet Prep [facility])  3. Vaginal odor + odor on exam  - POCT Wet Prep [facility])  4. BV (bacterial vaginosis) Wet prep +clue cells  Will rx flagyl No  sex or alcohol while taking meds Meds ordered this encounter  Medications   metroNIDAZOLE (FLAGYL) 500 MG tablet    Sig: Take 1 tablet (500 mg total) by mouth 2 (two) times daily.    Dispense:  14 tablet    Refill:  0    Supervising Provider:   Duane Lope H [[id number]]    - POCT Wet Prep [facility])  5. Cervicitis     Plan:    Request pap from [facility]  Follow up in 2 weeks for repeat exam

## 2023-11-12 ENCOUNTER — Ambulatory Visit: Payer: Medicaid Other | Admitting: Adult Health

## 2023-11-23 ENCOUNTER — Ambulatory Visit: Admitting: Adult Health

## 2023-11-23 ENCOUNTER — Encounter: Payer: Self-pay | Admitting: Adult Health

## 2023-11-23 VITALS — BP 117/71 | HR 90 | Ht 65.0 in | Wt 188.5 lb

## 2023-11-23 DIAGNOSIS — B9689 Other specified bacterial agents as the cause of diseases classified elsewhere: Secondary | ICD-10-CM | POA: Diagnosis not present

## 2023-11-23 DIAGNOSIS — N898 Other specified noninflammatory disorders of vagina: Secondary | ICD-10-CM

## 2023-11-23 DIAGNOSIS — N760 Acute vaginitis: Secondary | ICD-10-CM | POA: Diagnosis not present

## 2023-11-23 LAB — POCT WET PREP (WET MOUNT)
Clue Cells Wet Prep Whiff POC: POSITIVE
WBC, Wet Prep HPF POC: POSITIVE

## 2023-11-23 MED ORDER — METRONIDAZOLE 0.75 % VA GEL: VAGINAL | 0 refills | Status: DC

## 2023-11-23 NOTE — Progress Notes (Signed)
  Subjective:     Patient ID: Amanda Perez, female   DOB: [birth year], 33 y.o.   MRN: [id number]  HPI Amanda Perez is a 33 year old black female,single, [id number], back in follow up on being treated for BV and cervicitis. She says she is better, no bleeding, has noticed discharge.  Last pap was 09/25/22 NILM  PCP is Dr Sherril Croon  Review of Systems +discharge, no bleeding No pain with sex Reviewed past medical,surgical, social and family history. Reviewed medications and allergies.     Objective:   Physical Exam BP 117/71 (BP Location: Left Arm, Patient Position: Sitting, Cuff Size: Normal)   Pulse 90   Ht 5\' 5"  (1.651 m)   Wt 188 lb 8 oz (85.5 kg)   LMP 11/05/2023   BMI 31.37 kg/m     Skin warm and dry.Pelvic: external genitalia is normal in appearance no lesions, vagina: white discharge with odor,urethra has no lesions or masses noted, cervix: bulbous,slightly irritated anterior lip uterus: normal size, shape and contour, non tender, no masses felt, adnexa: no masses or tenderness noted. Bladder is non tender and no masses felt. Wet prep: + for clue cells and +WBCs. Fall risk is low  Up[facility] - 11/23/23 1102       Pregnancy Intention Screening   Does the patient want to become pregnant in the next year? No    Does the patient's partner want to become pregnant in the next year? No    Would the patient like to discuss contraceptive options today? No      Contraception Wrap Up   Current Method Female Sterilization    End Method Female Sterilization    Contraception Counseling Provided No            Examination chaperoned by Malachy Mood LPN  Assessment:     1. Vaginal discharge (Primary) +discharge with odor Wet prep:+clue cells  2. BV (bacterial vaginosis) Will rx Metrogel Meds ordered this encounter  Medications   metroNIDAZOLE (METROGEL) 0.75 % vaginal gel    Sig: Place 1 applicatorful in vagina at bedtime, for 5 nights    Dispense:  70 g    Refill:  0    Supervising  Provider:   Lazaro Arms [[id number]]        Plan:     Follow up prn

## 2023-11-30 DIAGNOSIS — F332 Major depressive disorder, recurrent severe without psychotic features: Secondary | ICD-10-CM | POA: Diagnosis not present

## 2023-11-30 DIAGNOSIS — Z299 Encounter for prophylactic measures, unspecified: Secondary | ICD-10-CM | POA: Diagnosis not present

## 2023-11-30 DIAGNOSIS — Z6829 Body mass index (BMI) 29.0-29.9, adult: Secondary | ICD-10-CM | POA: Diagnosis not present

## 2024-03-07 DIAGNOSIS — R634 Abnormal weight loss: Secondary | ICD-10-CM | POA: Diagnosis not present

## 2024-03-07 DIAGNOSIS — F332 Major depressive disorder, recurrent severe without psychotic features: Secondary | ICD-10-CM | POA: Diagnosis not present

## 2024-04-21 DIAGNOSIS — R5383 Other fatigue: Secondary | ICD-10-CM | POA: Diagnosis not present

## 2024-04-21 DIAGNOSIS — E7800 Pure hypercholesterolemia, unspecified: Secondary | ICD-10-CM | POA: Diagnosis not present

## 2024-04-21 DIAGNOSIS — Z1331 Encounter for screening for depression: Secondary | ICD-10-CM | POA: Diagnosis not present

## 2024-04-21 DIAGNOSIS — Z0000 Encounter for general adult medical examination without abnormal findings: Secondary | ICD-10-CM | POA: Diagnosis not present

## 2024-04-21 DIAGNOSIS — Z79899 Other long term (current) drug therapy: Secondary | ICD-10-CM | POA: Diagnosis not present

## 2024-04-21 DIAGNOSIS — Z299 Encounter for prophylactic measures, unspecified: Secondary | ICD-10-CM | POA: Diagnosis not present

## 2024-04-25 ENCOUNTER — Other Ambulatory Visit: Payer: Self-pay | Admitting: Obstetrics & Gynecology

## 2024-06-25 DIAGNOSIS — L2084 Intrinsic (allergic) eczema: Secondary | ICD-10-CM | POA: Diagnosis not present

## 2024-06-25 DIAGNOSIS — L219 Seborrheic dermatitis, unspecified: Secondary | ICD-10-CM | POA: Diagnosis not present

## 2024-06-25 DIAGNOSIS — L700 Acne vulgaris: Secondary | ICD-10-CM | POA: Diagnosis not present
